# Patient Record
Sex: Female | Born: 1955
Health system: Southern US, Community
[De-identification: ages and names within clinical notes are randomized; demographics above are authoritative.]

## PROBLEM LIST (undated history)

## (undated) DIAGNOSIS — E785 Hyperlipidemia, unspecified: Secondary | ICD-10-CM

## (undated) DIAGNOSIS — F419 Anxiety disorder, unspecified: Secondary | ICD-10-CM

## (undated) DIAGNOSIS — I10 Essential (primary) hypertension: Secondary | ICD-10-CM

## (undated) DIAGNOSIS — E039 Hypothyroidism, unspecified: Secondary | ICD-10-CM

## (undated) DIAGNOSIS — N6001 Solitary cyst of right breast: Secondary | ICD-10-CM

## (undated) DIAGNOSIS — N809 Endometriosis, unspecified: Secondary | ICD-10-CM

## (undated) DIAGNOSIS — N800 Endometriosis of uterus: Secondary | ICD-10-CM

## (undated) DIAGNOSIS — N8003 Adenomyosis of the uterus: Secondary | ICD-10-CM

## (undated) DIAGNOSIS — E119 Type 2 diabetes mellitus without complications: Secondary | ICD-10-CM

## (undated) DIAGNOSIS — R638 Other symptoms and signs concerning food and fluid intake: Secondary | ICD-10-CM

## (undated) HISTORY — PX: ABDOMINAL HYSTERECTOMY: SHX81

## (undated) HISTORY — DX: Hypothyroidism, unspecified: E03.9

## (undated) HISTORY — DX: Solitary cyst of right breast: N60.01

## (undated) HISTORY — DX: Other symptoms and signs concerning food and fluid intake: R63.8

## (undated) HISTORY — DX: Adenomyosis of the uterus: N80.03

## (undated) HISTORY — DX: Essential (primary) hypertension: I10

## (undated) HISTORY — DX: Endometriosis, unspecified: N80.9

## (undated) HISTORY — DX: Endometriosis of uterus: N80.0

## (undated) HISTORY — PX: LAPAROSCOPIC SALPINGOOPHERECTOMY: SUR795

## (undated) HISTORY — DX: Hyperlipidemia, unspecified: E78.5

## (undated) HISTORY — DX: Anxiety disorder, unspecified: F41.9

---
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
## 2004-01-29 ENCOUNTER — Ambulatory Visit: Payer: Self-pay | Admitting: Obstetrics and Gynecology

## 2004-02-04 ENCOUNTER — Ambulatory Visit: Payer: Self-pay | Admitting: Obstetrics and Gynecology

## 2004-08-02 ENCOUNTER — Ambulatory Visit: Payer: Self-pay | Admitting: Obstetrics and Gynecology

## 2004-11-22 ENCOUNTER — Ambulatory Visit: Payer: Self-pay | Admitting: Obstetrics and Gynecology

## 2005-02-28 ENCOUNTER — Ambulatory Visit: Payer: Self-pay | Admitting: Obstetrics and Gynecology

## 2006-01-05 ENCOUNTER — Ambulatory Visit: Payer: Self-pay | Admitting: Obstetrics and Gynecology

## 2007-01-24 ENCOUNTER — Ambulatory Visit: Payer: Self-pay | Admitting: Obstetrics and Gynecology

## 2007-11-22 ENCOUNTER — Ambulatory Visit: Payer: Self-pay | Admitting: Obstetrics and Gynecology

## 2007-11-28 ENCOUNTER — Ambulatory Visit: Payer: Self-pay | Admitting: Obstetrics and Gynecology

## 2008-01-29 ENCOUNTER — Ambulatory Visit: Payer: Self-pay | Admitting: Obstetrics and Gynecology

## 2009-02-03 ENCOUNTER — Ambulatory Visit: Payer: Self-pay | Admitting: Obstetrics and Gynecology

## 2010-03-18 ENCOUNTER — Other Ambulatory Visit: Payer: Self-pay | Admitting: Obstetrics and Gynecology

## 2010-03-31 ENCOUNTER — Ambulatory Visit: Payer: Self-pay | Admitting: Obstetrics and Gynecology

## 2011-04-13 ENCOUNTER — Ambulatory Visit: Payer: Self-pay | Admitting: Obstetrics and Gynecology

## 2011-04-25 ENCOUNTER — Ambulatory Visit: Payer: Self-pay | Admitting: Obstetrics and Gynecology

## 2012-03-20 ENCOUNTER — Ambulatory Visit: Payer: Self-pay | Admitting: Obstetrics and Gynecology

## 2012-04-19 ENCOUNTER — Ambulatory Visit: Payer: Self-pay | Admitting: Gastroenterology

## 2012-04-19 LAB — HM COLONOSCOPY

## 2012-04-20 LAB — PATHOLOGY REPORT

## 2013-03-26 LAB — HM PAP SMEAR: HM Pap smear: NEGATIVE

## 2014-01-03 HISTORY — PX: BREAST BIOPSY: SHX20

## 2014-03-26 ENCOUNTER — Ambulatory Visit: Payer: Self-pay | Admitting: Obstetrics and Gynecology

## 2014-03-31 ENCOUNTER — Ambulatory Visit: Payer: Self-pay | Admitting: Obstetrics and Gynecology

## 2014-03-31 LAB — HM MAMMOGRAPHY

## 2014-04-02 ENCOUNTER — Encounter: Payer: Self-pay | Admitting: *Deleted

## 2014-04-28 LAB — SURGICAL PATHOLOGY

## 2014-06-12 ENCOUNTER — Other Ambulatory Visit: Payer: Self-pay

## 2014-06-12 DIAGNOSIS — F419 Anxiety disorder, unspecified: Secondary | ICD-10-CM

## 2014-06-12 DIAGNOSIS — R609 Edema, unspecified: Secondary | ICD-10-CM

## 2014-06-12 MED ORDER — TRIAMTERENE-HCTZ 37.5-25 MG PO TABS
1.0000 | ORAL_TABLET | Freq: Every day | ORAL | Status: DC
Start: 2014-06-12 — End: 2015-04-07

## 2014-06-12 MED ORDER — SERTRALINE HCL 50 MG PO TABS
75.0000 mg | ORAL_TABLET | Freq: Every day | ORAL | Status: DC
Start: 2014-06-12 — End: 2015-03-03

## 2014-06-12 NOTE — Telephone Encounter (Signed)
Pt aware meds refilled. °

## 2014-07-01 ENCOUNTER — Other Ambulatory Visit: Payer: Self-pay | Admitting: Obstetrics and Gynecology

## 2014-07-01 ENCOUNTER — Ambulatory Visit: Payer: Self-pay | Admitting: Obstetrics and Gynecology

## 2014-07-01 VITALS — BP 117/75 | HR 79 | Ht 66.0 in | Wt 189.0 lb

## 2014-07-01 LAB — POCT URINALYSIS DIPSTICK
Bilirubin, UA: NEGATIVE
Glucose, UA: NEGATIVE
Ketones, UA: NEGATIVE
LEUKOCYTES UA: NEGATIVE
Nitrite, UA: NEGATIVE
Protein, UA: NEGATIVE
Urobilinogen, UA: 0.2
pH, UA: 7.5

## 2014-07-01 NOTE — Progress Notes (Signed)
Patient ID: Rebecca Rogers, female   DOB: 08-May-1955, 59 y.o.   MRN: [id number]    Pt c/o of painful urination x 1 day. No fever or flank pain. Will send urine culture. Urogesic samples given.

## 2014-07-03 ENCOUNTER — Telehealth: Payer: Self-pay

## 2014-07-03 LAB — URINE CULTURE

## 2014-07-03 MED ORDER — NITROFURANTOIN MONOHYD MACRO 100 MG PO CAPS: 100.0000 mg | ORAL_CAPSULE | Freq: Two times a day (BID) | ORAL | Status: DC

## 2014-07-03 NOTE — Telephone Encounter (Signed)
PT AWARE. POS UTI- PER MAD MACROBID ERX.

## 2014-07-14 ENCOUNTER — Telehealth: Payer: Self-pay

## 2014-07-14 MED ORDER — CIPROFLOXACIN HCL 500 MG PO TABS: 500.0000 mg | ORAL_TABLET | Freq: Two times a day (BID) | ORAL | Status: DC

## 2014-07-14 NOTE — Telephone Encounter (Signed)
-----   Message from Brayton Mars, MD sent at 07/14/2014 10:18 AM EDT ----- Please notify - Abnormal Labs Stop Macrobid. Start Cipro 500 mg bid x 7 days.

## 2014-07-14 NOTE — Telephone Encounter (Signed)
Pt aware.

## 2014-09-26 ENCOUNTER — Other Ambulatory Visit: Payer: Self-pay | Admitting: Obstetrics and Gynecology

## 2014-09-26 ENCOUNTER — Other Ambulatory Visit: Payer: Self-pay | Admitting: Family Medicine

## 2014-09-26 DIAGNOSIS — R928 Other abnormal and inconclusive findings on diagnostic imaging of breast: Secondary | ICD-10-CM

## 2014-10-07 ENCOUNTER — Ambulatory Visit
Admission: RE | Admit: 2014-10-07 | Discharge: 2014-10-07 | Disposition: A | Discharge status: Home / Self Care | Payer: 59 | Source: Ambulatory Visit | Attending: Obstetrics and Gynecology | Admitting: Obstetrics and Gynecology

## 2014-10-07 DIAGNOSIS — R928 Other abnormal and inconclusive findings on diagnostic imaging of breast: Secondary | ICD-10-CM | POA: Insufficient documentation

## 2015-02-12 DIAGNOSIS — L821 Other seborrheic keratosis: Secondary | ICD-10-CM | POA: Diagnosis not present

## 2015-02-23 DIAGNOSIS — H5213 Myopia, bilateral: Secondary | ICD-10-CM | POA: Diagnosis not present

## 2015-03-03 ENCOUNTER — Other Ambulatory Visit: Payer: Self-pay

## 2015-03-03 DIAGNOSIS — F419 Anxiety disorder, unspecified: Secondary | ICD-10-CM

## 2015-03-03 MED ORDER — SERTRALINE HCL 50 MG PO TABS: 75.0000 mg | ORAL_TABLET | Freq: Every day | ORAL | Status: DC

## 2015-04-07 ENCOUNTER — Encounter: Payer: Self-pay | Admitting: Obstetrics and Gynecology

## 2015-04-07 ENCOUNTER — Ambulatory Visit (INDEPENDENT_AMBULATORY_CARE_PROVIDER_SITE_OTHER): Payer: 59 | Admitting: Obstetrics and Gynecology

## 2015-04-07 VITALS — BP 122/72 | HR 98 | Ht 66.0 in | Wt 190.5 lb

## 2015-04-07 DIAGNOSIS — Z90710 Acquired absence of both cervix and uterus: Secondary | ICD-10-CM

## 2015-04-07 DIAGNOSIS — Z01419 Encounter for gynecological examination (general) (routine) without abnormal findings: Secondary | ICD-10-CM | POA: Diagnosis not present

## 2015-04-07 DIAGNOSIS — Z8041 Family history of malignant neoplasm of ovary: Secondary | ICD-10-CM | POA: Diagnosis not present

## 2015-04-07 DIAGNOSIS — Z90722 Acquired absence of ovaries, bilateral: Secondary | ICD-10-CM | POA: Diagnosis not present

## 2015-04-07 DIAGNOSIS — F419 Anxiety disorder, unspecified: Secondary | ICD-10-CM | POA: Diagnosis not present

## 2015-04-07 DIAGNOSIS — Z1211 Encounter for screening for malignant neoplasm of colon: Secondary | ICD-10-CM

## 2015-04-07 DIAGNOSIS — N6310 Unspecified lump in the right breast, unspecified quadrant: Secondary | ICD-10-CM

## 2015-04-07 DIAGNOSIS — N63 Unspecified lump in breast: Secondary | ICD-10-CM | POA: Diagnosis not present

## 2015-04-07 DIAGNOSIS — R638 Other symptoms and signs concerning food and fluid intake: Secondary | ICD-10-CM | POA: Diagnosis not present

## 2015-04-07 DIAGNOSIS — I10 Essential (primary) hypertension: Secondary | ICD-10-CM | POA: Diagnosis not present

## 2015-04-07 DIAGNOSIS — N8003 Adenomyosis of the uterus: Secondary | ICD-10-CM | POA: Insufficient documentation

## 2015-04-07 DIAGNOSIS — N8000 Endometriosis of the uterus, unspecified: Secondary | ICD-10-CM | POA: Insufficient documentation

## 2015-04-07 DIAGNOSIS — Z90711 Acquired absence of uterus with remaining cervical stump: Secondary | ICD-10-CM | POA: Insufficient documentation

## 2015-04-07 MED ORDER — SERTRALINE HCL 50 MG PO TABS: 75.0000 mg | ORAL_TABLET | Freq: Every day | ORAL | Status: DC

## 2015-04-07 MED ORDER — TRIAMTERENE-HCTZ 37.5-25 MG PO TABS: 1.0000 | ORAL_TABLET | Freq: Every day | ORAL | Status: DC

## 2015-04-07 NOTE — Addendum Note (Signed)
Addended by: Elouise Munroe on: 04/07/2015 09:27 AM   Modules accepted: Orders

## 2015-04-07 NOTE — Patient Instructions (Signed)
1. No Pap needed 2. Mammogram ordered 3. Colon cancer screening with colonoscopy this year to be scheduled 4. Continue with healthy eating and exercise and weight loss 5. Refill Zoloft 6. Refill triamterene hydrochlorothiazide 7. Return in 1 year

## 2015-04-07 NOTE — Progress Notes (Signed)
Patient ID: Rebecca Rogers, female   DOB: May 13, 1955, 60 y.o.   MRN: [id number] ANNUAL PREVENTATIVE CARE GYN  ENCOUNTER NOTE  Subjective:       Rebecca Rogers is a 60 y.o. G0P0000 female here for a routine annual gynecologic exam.  Current complaints: 1.  none   60 year old white female para 0, menopausal, on no hormone replacement therapy, asymptomatic, with history of adenomyosis, status post [place], with family history of ovarian cancer in mom, status post prophylactic BSO 8 years ago, presents for routine physical. Interval history: Status post core needle biopsies of right breast abnormality found on screening mammogram mammogram 2016, benign; status post interval mammogram October 2016-BI-RADS 3 History of increased BMI; weight stable History of chronic peripheral edema, controlled with triamterene hydrochlorothiazide History of anxiety/depression, controlled on Zoloft 75 mg a day History of colon polyps; screening colonoscopy due this year     Gynecologic History No LMP recorded. Patient has had a hysterectomy. Contraception: status post hysterectomy; [place]; pathology-adenomyosis Family history ovarian cancer; status post laparoscopic BSO Last Pap: 03/2013 neg/neg.  Last mammogram: 10/2014 birad 3 need bilateral dx and rt u/s. Results were: birad 3  Obstetric History OB History  Gravida Para Term Preterm AB SAB TAB Ectopic Multiple Living  0 0 0 0 0 0 0 0 0 0         Past Medical History  Diagnosis Date  . Adenomyosis   . Hypertension     bordeline  . Anxiety   . Increased BMI   . Cyst of right breast     4-5 oclock 1/2 cm    Past Surgical History  Procedure Laterality Date  . Tonsillectomy  1965  . Laparoscopic salpingoopherectomy      prophylactic  . Abdominal hysterectomy      lsh- adenomysis and fibroids    Current Outpatient Prescriptions on File Prior to Visit  Medication Sig Dispense Refill  . sertraline (ZOLOFT) 50 MG tablet Take 1.5 tablets (75 mg  total) by mouth daily. 1 & 1/2 tab daily 135 tablet 4  . triamterene-hydrochlorothiazide (MAXZIDE-25) 37.5-25 MG per tablet Take 1 tablet by mouth daily. 30 tablet 9   No current facility-administered medications on file prior to visit.    No Known Allergies  Social History   Social History  . Marital Status: Married    Spouse Name: N/A  . Number of Children: N/A  . Years of Education: N/A   Occupational History  . Not on file.   Social History Main Topics  . Smoking status: Never Smoker   . Smokeless tobacco: Not on file  . Alcohol Use: No  . Drug Use: No  . Sexual Activity: Not Currently   Other Topics Concern  . Not on file   Social History Narrative    Family History  Problem Relation Age of Onset  . Ovarian cancer Mother   . Breast cancer Neg Hx   . Colon cancer Neg Hx   . Diabetes Neg Hx   . Heart disease Neg Hx     The following portions of the patient's history were reviewed and updated as appropriate: allergies, current medications, past family history, past medical history, past social history, past surgical history and problem list.  Review of Systems ROS Review of Systems - General ROS: negative for - chills, fatigue, fever, hot flashes, night sweats, weight gain or weight loss Psychological ROS: negative for - anxiety, decreased libido, depression, mood swings, physical abuse or sexual abuse Ophthalmic ROS:  negative for - blurry vision, eye pain or loss of vision ENT ROS: negative for - headaches, hearing change, visual changes or vocal changes Allergy and Immunology ROS: negative for - hives, itchy/watery eyes or seasonal allergies Hematological and Lymphatic ROS: negative for - bleeding problems, bruising, swollen lymph nodes or weight loss Endocrine ROS: negative for - galactorrhea, hair pattern changes, hot flashes, malaise/lethargy, mood swings, palpitations, polydipsia/polyuria, skin changes, temperature intolerance or unexpected weight  changes Breast ROS: negative for - new or changing breast lumps or nipple discharge Respiratory ROS: negative for - cough or shortness of breath Cardiovascular ROS: negative for - chest pain, irregular heartbeat, palpitations or shortness of breath Gastrointestinal ROS: no abdominal pain, change in bowel habits, or black or bloody stools Genito-Urinary ROS: no dysuria, trouble voiding, or hematuria Musculoskeletal ROS: negative for - joint pain or joint stiffness Neurological ROS: negative for - bowel and bladder control changes Dermatological ROS: negative for rash and skin lesion changes   Objective:   BP 122/72 mmHg  Pulse 98  Ht 5\' 6"  (1.676 m)  Wt 190 lb 8 oz (86.41 kg)  BMI 30.76 kg/m2 CONSTITUTIONAL: Well-developed, well-nourished female in no acute distress.  PSYCHIATRIC: Normal mood and affect. Normal behavior. Normal judgment and thought content. [place]: Alert and oriented to person, place, and time. Normal muscle tone coordination. No cranial nerve deficit noted. HENT:  Normocephalic, atraumatic, External right and left ear normal. Oropharynx is clear and moist EYES: Conjunctivae and EOM are normal.  No scleral icterus.  NECK: Normal range of motion, supple, no masses.  Normal thyroid.  SKIN: Skin is warm and dry. No rash noted. Not diaphoretic. No erythema. No pallor. CARDIOVASCULAR: Normal heart rate noted, regular rhythm, no murmur. RESPIRATORY: Clear to auscultation bilaterally. Effort and breath sounds normal, no problems with respiration noted. BREASTS: Symmetric in size. No masses, skin changes, nipple drainage, or lymphadenopathy. ABDOMEN: Soft, normal bowel sounds, no distention noted.  No tenderness, rebound or guarding.  BLADDER: Normal PELVIC:  External Genitalia: Normal  BUS: Normal  Vagina: Normal; thin white secretions  Cervix: Normal; no cervical motion tenderness  Uterus: Surgically absent  Adnexa: Surgically absent; no palpable masses  RV: External  Exam NormaI, No Rectal Masses and Normal Sphincter tone  MUSCULOSKELETAL: Normal range of motion. No tenderness.  No cyanosis, clubbing, or edema.  2+ distal pulses. LYMPHATIC: No Axillary, Supraclavicular, or Inguinal Adenopathy.    Assessment:   Annual gynecologic examination 60 y.o. Contraception: status post hysterectomy LSH; status post laparoscopic BSO bmi-30, stable Surgical menopause, asymptomatic Peripheral edema, controlled with diuretic History of colon polyps, in need of colonoscopy  Plan:  Pap: Not needed Mammogram: Ordered Stool Guaiac Testing:  colonoscopy due Labs: renal panel, vit d, a1c,lipid, tsh Routine preventative health maintenance measures emphasized: Exercise/Diet/Weight control, Tobacco Warnings, Alcohol/Substance use risks and Stress Management Refill Zoloft Refill triamterene hydrochlorothiazide Return to [place], CMA  Brayton Mars, MD  Note: This dictation was prepared with Dragon dictation along with smaller phrase technology. Any transcriptional errors that result from this process are unintentional.

## 2015-04-07 NOTE — Addendum Note (Signed)
Addended by: Elouise Munroe on: 04/07/2015 09:18 AM   Modules accepted: Orders

## 2015-04-14 NOTE — Addendum Note (Signed)
Addended by: Elouise Munroe on: 04/14/2015 08:52 AM   Modules accepted: Orders

## 2015-04-21 DIAGNOSIS — Z01419 Encounter for gynecological examination (general) (routine) without abnormal findings: Secondary | ICD-10-CM | POA: Diagnosis not present

## 2015-04-21 DIAGNOSIS — I10 Essential (primary) hypertension: Secondary | ICD-10-CM | POA: Diagnosis not present

## 2015-04-22 ENCOUNTER — Telehealth: Payer: Self-pay

## 2015-04-22 ENCOUNTER — Other Ambulatory Visit: Payer: Self-pay | Admitting: Obstetrics and Gynecology

## 2015-04-22 DIAGNOSIS — R7989 Other specified abnormal findings of blood chemistry: Secondary | ICD-10-CM

## 2015-04-22 DIAGNOSIS — E039 Hypothyroidism, unspecified: Secondary | ICD-10-CM

## 2015-04-22 DIAGNOSIS — R946 Abnormal results of thyroid function studies: Secondary | ICD-10-CM | POA: Diagnosis not present

## 2015-04-22 LAB — LIPID PANEL
CHOL/HDL RATIO: 7.3 ratio — AB (ref 0.0–4.4)
Cholesterol, Total: 211 mg/dL — ABNORMAL HIGH (ref 100–199)
HDL: 29 mg/dL — AB (ref >39)
LDL Calculated: 125 mg/dL — ABNORMAL HIGH (ref 0–99)
TRIGLYCERIDES: 284 mg/dL — AB (ref 0–149)
VLDL CHOLESTEROL CAL: 57 mg/dL — AB (ref 5–40)

## 2015-04-22 LAB — RENAL FUNCTION PANEL
ALBUMIN: 4 g/dL (ref 3.5–5.5)
BUN/Creatinine Ratio: 14 (ref 9–23)
BUN: 13 mg/dL (ref 6–24)
CHLORIDE: 98 mmol/L (ref 96–106)
CO2: 25 mmol/L (ref 18–29)
Calcium: 9.6 mg/dL (ref 8.7–10.2)
Creatinine, Ser: 0.93 mg/dL (ref 0.57–1.00)
GFR calc Af Amer: 79 mL/min/{1.73_m2} (ref >59)
GFR calc non Af Amer: 68 mL/min/{1.73_m2} (ref >59)
GLUCOSE: 109 mg/dL — AB (ref 65–99)
PHOSPHORUS: 3.3 mg/dL (ref 2.5–4.5)
POTASSIUM: 4.5 mmol/L (ref 3.5–5.2)
Sodium: 140 mmol/L (ref 134–144)

## 2015-04-22 LAB — TSH: TSH: 5.26 u[IU]/mL — ABNORMAL HIGH (ref 0.450–4.500)

## 2015-04-22 LAB — HEMOGLOBIN A1C
Est. average glucose Bld gHb Est-mCnc: 126 mg/dL
HEMOGLOBIN A1C: 6 % — AB (ref 4.8–5.6)

## 2015-04-22 LAB — VITAMIN D 25 HYDROXY (VIT D DEFICIENCY, FRACTURES): VIT D 25 HYDROXY: 31.7 ng/mL (ref 30.0–100.0)

## 2015-04-22 MED ORDER — LEVOTHYROXINE SODIUM 50 MCG PO TABS: 50.0000 ug | ORAL_TABLET | Freq: Every day | ORAL | Status: DC

## 2015-04-22 NOTE — Telephone Encounter (Signed)
Mad spoke with [name]

## 2015-04-22 NOTE — Telephone Encounter (Signed)
-----   Message from Brayton Mars, MD sent at 04/22/2015  8:53 AM EDT ----- Please notify - Abnormal Labs Repeat in 6 months Synthroid 50 g a day- Draw TSH and free T4 this morning and repeat in 3 months    I did talk with [name]

## 2015-04-23 LAB — TSH: TSH: 4.35 u[IU]/mL (ref 0.450–4.500)

## 2015-04-23 LAB — T4, FREE: FREE T4: 1.01 ng/dL (ref 0.82–1.77)

## 2015-04-24 ENCOUNTER — Ambulatory Visit
Admission: RE | Admit: 2015-04-24 | Discharge: 2015-04-24 | Disposition: A | Discharge status: Home / Self Care | Payer: 59 | Source: Ambulatory Visit | Attending: Obstetrics and Gynecology | Admitting: Obstetrics and Gynecology

## 2015-04-24 ENCOUNTER — Other Ambulatory Visit: Payer: Self-pay | Admitting: Obstetrics and Gynecology

## 2015-04-24 DIAGNOSIS — N6310 Unspecified lump in the right breast, unspecified quadrant: Secondary | ICD-10-CM

## 2015-04-24 DIAGNOSIS — N63 Unspecified lump in breast: Secondary | ICD-10-CM | POA: Diagnosis not present

## 2015-04-24 DIAGNOSIS — R928 Other abnormal and inconclusive findings on diagnostic imaging of breast: Secondary | ICD-10-CM | POA: Diagnosis not present

## 2015-04-24 NOTE — Telephone Encounter (Signed)
error 

## 2015-04-27 ENCOUNTER — Telehealth: Payer: Self-pay

## 2015-04-27 ENCOUNTER — Other Ambulatory Visit: Payer: Self-pay

## 2015-04-27 DIAGNOSIS — Z86010 Personal history of colonic polyps: Secondary | ICD-10-CM

## 2015-04-27 NOTE — Telephone Encounter (Signed)
Gastroenterology Pre-Procedure Review  Request Date: 06/05/15 Requesting Physician: Dr. Allen Norris  PATIENT REVIEW QUESTIONS: The patient responded to the following health history questions as indicated:    1. Are you having any GI issues? no 2. Do you have a personal history of Polyps? yes (3 Years ago- Dr. Allen Norris removed) 3. Do you have a family history of Colon Cancer or Polyps? no 4. Diabetes Mellitus? no 5. Joint replacements in the past 12 months?no 6. Major health problems in the past 3 months?no 7. Any artificial heart valves, MVP, or defibrillator?no    MEDICATIONS & ALLERGIES:    Patient reports the following regarding taking any anticoagulation/antiplatelet therapy:   Plavix, Coumadin, Eliquis, Xarelto, Lovenox, Pradaxa, Brilinta, or Effient? no Aspirin? no  Patient confirms/reports the following medications:  Current Outpatient Prescriptions  Medication Sig Dispense Refill  . levothyroxine (SYNTHROID) 50 MCG tablet Take 1 tablet (50 mcg total) by mouth daily before breakfast. 90 tablet 1  . sertraline (ZOLOFT) 50 MG tablet Take 1.5 tablets (75 mg total) by mouth daily. 1 & 1/2 tab daily 135 tablet 4  . triamterene-hydrochlorothiazide (MAXZIDE-25) 37.5-25 MG tablet Take 1 tablet by mouth daily. 90 tablet 4   No current facility-administered medications for this visit.    Patient confirms/reports the following allergies:  No Known Allergies  No orders of the defined types were placed in this encounter.    AUTHORIZATION INFORMATION Primary Insurance: 1D#: Group #:  Secondary Insurance: 1D#: Group #:  SCHEDULE INFORMATION: Date: 06/05/15 Time: Location: [place]

## 2015-05-01 NOTE — Progress Notes (Signed)
Pt will be made aware thru my chart message. Order placed. Will put on lab schedule.

## 2015-05-01 NOTE — Addendum Note (Signed)
Addended by: Elouise Munroe on: 05/01/2015 11:57 AM   Modules accepted: Orders

## 2015-05-26 ENCOUNTER — Encounter: Payer: Self-pay | Admitting: *Deleted

## 2015-06-03 ENCOUNTER — Other Ambulatory Visit: Payer: Self-pay | Admitting: Obstetrics and Gynecology

## 2015-06-03 DIAGNOSIS — R599 Enlarged lymph nodes, unspecified: Secondary | ICD-10-CM | POA: Diagnosis not present

## 2015-06-03 MED ORDER — ACYCLOVIR 400 MG PO TABS: 400.0000 mg | ORAL_TABLET | Freq: Three times a day (TID) | ORAL | Status: DC

## 2015-06-03 NOTE — Discharge Instructions (Signed)

## 2015-06-04 DIAGNOSIS — L03211 Cellulitis of face: Secondary | ICD-10-CM | POA: Diagnosis not present

## 2015-06-04 DIAGNOSIS — K120 Recurrent oral aphthae: Secondary | ICD-10-CM | POA: Diagnosis not present

## 2015-06-04 LAB — CBC WITH DIFFERENTIAL/PLATELET
Basophils Absolute: 0 10*3/uL (ref 0.0–0.2)
Basos: 0 %
EOS (ABSOLUTE): 0.3 10*3/uL (ref 0.0–0.4)
EOS: 4 %
HEMATOCRIT: 42.5 % (ref 34.0–46.6)
HEMOGLOBIN: 14.8 g/dL (ref 11.1–15.9)
Immature Grans (Abs): 0 10*3/uL (ref 0.0–0.1)
Immature Granulocytes: 0 %
LYMPHS ABS: 1.5 10*3/uL (ref 0.7–3.1)
Lymphs: 21 %
MCH: 30.1 pg (ref 26.6–33.0)
MCHC: 34.8 g/dL (ref 31.5–35.7)
MCV: 86 fL (ref 79–97)
MONOCYTES: 10 %
MONOS ABS: 0.8 10*3/uL (ref 0.1–0.9)
NEUTROS ABS: 4.6 10*3/uL (ref 1.4–7.0)
Neutrophils: 65 %
Platelets: 365 10*3/uL (ref 150–379)
RBC: 4.92 x10E6/uL (ref 3.77–5.28)
RDW: 14.3 % (ref 12.3–15.4)
WBC: 7.2 10*3/uL (ref 3.4–10.8)

## 2015-06-04 LAB — HSV(HERPES SIMPLEX VRS) I + II AB-IGG
HSV 1 Glycoprotein G Ab, IgG: <0.91 index (ref 0.00–0.90)
HSV 2 Glycoprotein G Ab, IgG: <0.91 index (ref 0.00–0.90)

## 2015-06-04 LAB — HSV 1 AND 2 IGM ABS, INDIRECT
HSV 1 IgM: <1:10 {titer}
HSV 2 IgM: <1:10 {titer}

## 2015-06-04 LAB — IGG: IGG (IMMUNOGLOBIN G), SERUM: 1385 mg/dL (ref 700–1600)

## 2015-06-04 LAB — SEDIMENTATION RATE: SED RATE: 40 mm/h (ref 0–40)

## 2015-06-04 LAB — IGM: IGM (IMMUNOGLOBULIN M), SRM: 87 mg/dL (ref 26–217)

## 2015-06-05 ENCOUNTER — Ambulatory Visit: Payer: 59 | Admitting: Anesthesiology

## 2015-06-05 ENCOUNTER — Encounter
Admission: RE | Disposition: A | Discharge status: Home / Self Care | Payer: Self-pay | Source: Ambulatory Visit | Attending: Gastroenterology

## 2015-06-05 ENCOUNTER — Encounter: Payer: Self-pay | Admitting: *Deleted

## 2015-06-05 ENCOUNTER — Ambulatory Visit
Admission: RE | Admit: 2015-06-05 | Discharge: 2015-06-05 | Disposition: A | Discharge status: Home / Self Care | Payer: 59 | Source: Ambulatory Visit | Attending: Gastroenterology | Admitting: Gastroenterology

## 2015-06-05 DIAGNOSIS — Z90710 Acquired absence of both cervix and uterus: Secondary | ICD-10-CM | POA: Insufficient documentation

## 2015-06-05 DIAGNOSIS — E039 Hypothyroidism, unspecified: Secondary | ICD-10-CM | POA: Diagnosis not present

## 2015-06-05 DIAGNOSIS — Z9889 Other specified postprocedural states: Secondary | ICD-10-CM | POA: Diagnosis not present

## 2015-06-05 DIAGNOSIS — I10 Essential (primary) hypertension: Secondary | ICD-10-CM | POA: Insufficient documentation

## 2015-06-05 DIAGNOSIS — D125 Benign neoplasm of sigmoid colon: Secondary | ICD-10-CM | POA: Diagnosis not present

## 2015-06-05 DIAGNOSIS — Z8042 Family history of malignant neoplasm of prostate: Secondary | ICD-10-CM | POA: Diagnosis not present

## 2015-06-05 DIAGNOSIS — F419 Anxiety disorder, unspecified: Secondary | ICD-10-CM | POA: Diagnosis not present

## 2015-06-05 DIAGNOSIS — D120 Benign neoplasm of cecum: Secondary | ICD-10-CM | POA: Diagnosis not present

## 2015-06-05 DIAGNOSIS — Z79899 Other long term (current) drug therapy: Secondary | ICD-10-CM | POA: Diagnosis not present

## 2015-06-05 DIAGNOSIS — D122 Benign neoplasm of ascending colon: Secondary | ICD-10-CM | POA: Insufficient documentation

## 2015-06-05 DIAGNOSIS — Z1211 Encounter for screening for malignant neoplasm of colon: Secondary | ICD-10-CM | POA: Diagnosis not present

## 2015-06-05 DIAGNOSIS — K635 Polyp of colon: Secondary | ICD-10-CM | POA: Insufficient documentation

## 2015-06-05 DIAGNOSIS — K641 Second degree hemorrhoids: Secondary | ICD-10-CM | POA: Insufficient documentation

## 2015-06-05 DIAGNOSIS — Z860100 Personal history of colon polyps, unspecified: Secondary | ICD-10-CM | POA: Insufficient documentation

## 2015-06-05 DIAGNOSIS — Z8041 Family history of malignant neoplasm of ovary: Secondary | ICD-10-CM | POA: Insufficient documentation

## 2015-06-05 HISTORY — PX: COLONOSCOPY WITH PROPOFOL: SHX5780

## 2015-06-05 HISTORY — PX: POLYPECTOMY: SHX5525

## 2015-06-05 SURGERY — COLONOSCOPY WITH PROPOFOL
Anesthesia: Monitor Anesthesia Care | Wound class: Contaminated

## 2015-06-05 MED ORDER — PROPOFOL 10 MG/ML IV BOLUS
INTRAVENOUS | Status: DC | PRN
  Administered 2015-06-05 (×6): 20 mg via INTRAVENOUS
  Administered 2015-06-05: 50 mg via INTRAVENOUS
  Administered 2015-06-05: 100 mg via INTRAVENOUS
  Administered 2015-06-05: 20 mg via INTRAVENOUS
  Administered 2015-06-05: 50 mg via INTRAVENOUS
  Administered 2015-06-05: 10 mg via INTRAVENOUS

## 2015-06-05 MED ORDER — LIDOCAINE HCL (CARDIAC) 20 MG/ML IV SOLN
INTRAVENOUS | Status: DC | PRN
  Administered 2015-06-05: 40 mg via INTRAVENOUS

## 2015-06-05 MED ORDER — STERILE WATER FOR IRRIGATION IR SOLN
Status: DC | PRN
  Administered 2015-06-05: 08:00:00

## 2015-06-05 MED ORDER — LACTATED RINGERS IV SOLN
INTRAVENOUS | Status: DC
  Administered 2015-06-05 (×2): via INTRAVENOUS

## 2015-06-05 SURGICAL SUPPLY — 23 items
CANISTER SUCT 1200ML W/VALVE (MISCELLANEOUS) ×3 IMPLANT
CLIP HMST 235XBRD CATH ROT (MISCELLANEOUS) IMPLANT
CLIP RESOLUTION 360 11X235 (MISCELLANEOUS)
FCP ESCP3.2XJMB 240X2.8X (MISCELLANEOUS)
FORCEPS BIOP RAD 4 LRG CAP 4 (CUTTING FORCEPS) IMPLANT
FORCEPS BIOP RJ4 240 W/NDL (MISCELLANEOUS)
FORCEPS ESCP3.2XJMB 240X2.8X (MISCELLANEOUS) IMPLANT
GOWN CVR UNV OPN BCK APRN NK (MISCELLANEOUS) ×4 IMPLANT
GOWN ISOL THUMB LOOP REG UNIV (MISCELLANEOUS) ×2
INJECTOR VARIJECT VIN23 (MISCELLANEOUS) IMPLANT
KIT DEFENDO VALVE AND CONN (KITS) IMPLANT
KIT ENDO PROCEDURE OLY (KITS) ×3 IMPLANT
MARKER SPOT ENDO TATTOO 5ML (MISCELLANEOUS) IMPLANT
PAD GROUND ADULT SPLIT (MISCELLANEOUS) IMPLANT
PROBE APC STR FIRE (PROBE) IMPLANT
SNARE SHORT THROW 13M SML OVAL (MISCELLANEOUS) ×3 IMPLANT
SNARE SHORT THROW 30M LRG OVAL (MISCELLANEOUS) IMPLANT
SNARE SNG USE RND 15MM (INSTRUMENTS) IMPLANT
SNARE SPIRAL (MISCELLANEOUS) ×3 IMPLANT
SPOT EX ENDOSCOPIC TATTOO (MISCELLANEOUS)
TRAP ETRAP POLY (MISCELLANEOUS) ×3 IMPLANT
VARIJECT INJECTOR VIN23 (MISCELLANEOUS)
WATER STERILE IRR 250ML POUR (IV SOLUTION) ×3 IMPLANT

## 2015-06-05 NOTE — H&P (Signed)
  [facility]  [address], [place], [place] [address] Phone: [phone] Fax : [phone]  Primary Care Physician:  No primary care provider on file. Primary Gastroenterologist:  Dr. Allen Norris  Pre-Procedure History & Physical: HPI:  Rebecca Rogers is a 60 y.o. female is here for an colonoscopy.   Past Medical History  Diagnosis Date  . Adenomyosis   . Hypertension     [place]  . Anxiety   . Increased BMI   . Cyst of right breast     4-5 oclock 1/2 cm    Past Surgical History  Procedure Laterality Date  . Tonsillectomy  1965  . Laparoscopic salpingoopherectomy      prophylactic  . Abdominal hysterectomy      lsh- adenomysis and fibroids    Prior to Admission medications   Medication Sig Start Date End Date Taking? Authorizing Provider  acetaminophen (TYLENOL) 325 MG tablet Take 650 mg by mouth every 6 (six) hours as needed.   Yes Historical Provider, MD  acyclovir (ZOVIRAX) 400 MG tablet Take 1 tablet (400 mg total) by mouth 3 (three) times daily. For 10 days 06/03/15  Yes Brayton Mars, MD  levothyroxine (SYNTHROID) 50 MCG tablet Take 1 tablet (50 mcg total) by mouth daily before breakfast. 04/22/15  Yes Alanda Slim Defrancesco, MD  sertraline (ZOLOFT) 50 MG tablet Take 1.5 tablets (75 mg total) by mouth daily. 1 & 1/2 tab daily 04/07/15  Yes Alanda Slim Defrancesco, MD  triamterene-hydrochlorothiazide (MAXZIDE-25) 37.5-25 MG tablet Take 1 tablet by mouth daily. 04/07/15  Yes Brayton Mars, MD    Allergies as of 04/27/2015  . (No Known Allergies)    Family History  Problem Relation Age of Onset  . Ovarian cancer Mother   . Breast cancer Neg Hx   . Colon cancer Neg Hx   . Diabetes Neg Hx   . Heart disease Neg Hx   . Prostate cancer Father     Social History   Social History  . Marital Status: Married    Spouse Name: N/A  . Number of Children: N/A  . Years of Education: N/A   Occupational History  . Not on file.   Social  History Main Topics  . Smoking status: Never Smoker   . Smokeless tobacco: Never Used  . Alcohol Use: No  . Drug Use: No  . Sexual Activity: Not Currently   Other Topics Concern  . Not on file   Social History Narrative    Review of Systems: See HPI, otherwise negative ROS  Physical Exam: BP 148/72 mmHg  Pulse 74  Temp(Src) 99.1 F (37.3 C)  Resp 16  Ht 5\' 6"  (1.676 m)  Wt 185 lb (83.915 kg)  BMI 29.87 kg/m2  SpO2 97% General:   Alert,  pleasant and cooperative in NAD Head:  Normocephalic and atraumatic. Neck:  Supple; no masses or thyromegaly. Lungs:  Clear throughout to auscultation.    Heart:  Regular rate and rhythm. Abdomen:  Soft, nontender and nondistended. Normal bowel sounds, without guarding, and without rebound.   Neurologic:  Alert and  oriented x4;  grossly normal neurologically.  Impression/Plan: [name] is here for an colonoscopy to be performed for history of colon poylps  Risks, benefits, limitations, and alternatives regarding  endoscopy have been reviewed with the patient.  Questions have been answered.  All parties agreeable.   Lucilla Lame, MD  06/05/2015, 7:53 AM

## 2015-06-05 NOTE — Anesthesia Procedure Notes (Signed)
Procedure Name: MAC Performed by: Azaryah Heathcock Pre-anesthesia Checklist: Patient identified, Emergency Drugs available, Suction available, Patient being monitored and Timeout performed Patient Re-evaluated:Patient Re-evaluated prior to inductionOxygen Delivery Method: Nasal cannula       

## 2015-06-05 NOTE — Op Note (Signed)
[facility] Gastroenterology Patient Name: Rebecca Rogers Procedure Date: 06/05/2015 8:12 AM MRN: [id number] Account #: [id number] Date of Birth: 1955/10/29 Admit Type: Outpatient Age: 60 Room: [facility] OR ROOM 01 Gender: Female Note Status: Finalized Procedure:            Colonoscopy Indications:          High risk colon cancer surveillance: Personal history                        of colonic polyps Providers:            Lucilla Lame, MD Referring MD:         Alanda Slim. Defrancesco, MD (Referring MD) Medicines:            Propofol per Anesthesia Complications:        No immediate complications. Procedure:            Pre-Anesthesia Assessment:                       - Prior to the procedure, a History and Physical was                        performed, and patient medications and allergies were                        reviewed. The patient's tolerance of previous                        anesthesia was also reviewed. The risks and benefits of                        the procedure and the sedation options and risks were                        discussed with the patient. All questions were                        answered, and informed consent was obtained. Prior                        Anticoagulants: The patient has taken no previous                        anticoagulant or antiplatelet agents. ASA Grade                        Assessment: II - A patient with mild systemic disease.                        After reviewing the risks and benefits, the patient was                        deemed in satisfactory condition to undergo the                        procedure.                       After obtaining informed consent, the colonoscope was  passed under direct vision. Throughout the procedure,                        the patient's blood pressure, pulse, and oxygen                        saturations were monitored continuously. The Olympus CF    H180AL colonoscope (S#: [id number]) was introduced through                        the anus and advanced to the the cecum, identified by                        appendiceal orifice and ileocecal valve. The                        colonoscopy was performed without difficulty. The                        patient tolerated the procedure well. The quality of                        the bowel preparation was good. Findings:      The perianal and digital rectal examinations were normal.      A 10 mm polyp was found in the ascending colon. The polyp was sessile.       The polyp was removed with a cold snare. Resection and retrieval were       complete.      A 4 mm polyp was found in the cecum. The polyp was sessile. The polyp       was removed with a cold snare. Resection and retrieval were complete.      Two sessile polyps were found in the sigmoid colon. The polyps were 4 to       7 mm in size. These polyps were removed with a cold snare. Resection and       retrieval were complete.      Non-bleeding internal hemorrhoids were found during retroflexion. The       hemorrhoids were Grade II (internal hemorrhoids that prolapse but reduce       spontaneously).      A 3 mm polyp was found in the transverse colon. The polyp was sessile.       The polyp was removed with a cold snare. Resection and retrieval were       complete. Impression:           - One 10 mm polyp in the ascending colon, removed with                        a cold snare. Resected and retrieved.                       - One 4 mm polyp in the cecum, removed with a cold                        snare. Resected and retrieved.                       - Two 4 to 7 mm polyps in the sigmoid colon, removed  with a cold snare. Resected and retrieved.                       - Non-bleeding internal hemorrhoids. Recommendation:       - Repeat colonoscopy in 3 years for surveillance. Procedure Code(s):    --- Professional ---                        [phone], Colonoscopy, flexible; with removal of tumor(s),                        polyp(s), or other lesion(s) by snare technique Diagnosis Code(s):    --- Professional ---                       Z86.010, Personal history of colonic polyps                       D12.2, Benign neoplasm of ascending colon                       D12.0, Benign neoplasm of cecum                       D12.5, Benign neoplasm of sigmoid colon CPT copyright 2016 American Medical Association. All rights reserved. The codes documented in this report are preliminary and upon coder review may  be revised to meet current compliance requirements. Lucilla Lame, MD 06/05/2015 8:45:06 AM This report has been signed electronically. Number of Addenda: 0 Note Initiated On: 06/05/2015 8:12 AM Scope Withdrawal Time: 0 hours 11 minutes 24 seconds  Total Procedure Duration: 0 hours 20 minutes 35 seconds       [facility]

## 2015-06-05 NOTE — Anesthesia Postprocedure Evaluation (Signed)
Anesthesia Post Note  Patient: Rebecca Rogers  Procedure(s) Performed: Procedure(s) (LRB): COLONOSCOPY WITH PROPOFOL (N/A) POLYPECTOMY  Patient location during evaluation: PACU Anesthesia Type: General Level of consciousness: awake and alert Pain management: pain level controlled Vital Signs Assessment: post-procedure vital signs reviewed and stable Respiratory status: spontaneous breathing, nonlabored ventilation, respiratory function stable and patient connected to nasal cannula oxygen Cardiovascular status: blood pressure returned to baseline and stable Postop Assessment: no signs of nausea or vomiting Anesthetic complications: no    Marshell Levan

## 2015-06-05 NOTE — Anesthesia Preprocedure Evaluation (Signed)
Anesthesia Evaluation  Patient identified by MRN, date of birth, ID band Patient awake    Airway Mallampati: II  TM Distance: >3 FB Neck ROM: Full    Dental   Pulmonary    Pulmonary exam normal        Cardiovascular hypertension, Normal cardiovascular exam     Neuro/Psych    GI/Hepatic   Endo/Other  Hypothyroidism   Renal/GU      Musculoskeletal   Abdominal   Peds  Hematology   Anesthesia Other Findings   Reproductive/Obstetrics                             Anesthesia Physical Anesthesia Plan  ASA: II  Anesthesia Plan: MAC   Post-op Pain Management:    Induction: Intravenous  Airway Management Planned:   Additional Equipment:   Intra-op Plan:   Post-operative Plan:   Informed Consent: I have reviewed the patients History and Physical, chart, labs and discussed the procedure including the risks, benefits and alternatives for the proposed anesthesia with the patient or authorized representative who has indicated his/her understanding and acceptance.     Plan Discussed with: CRNA  Anesthesia Plan Comments:         Anesthesia Quick Evaluation

## 2015-06-05 NOTE — Transfer of Care (Signed)
Immediate Anesthesia Transfer of Care Note  Patient: Rebecca Rogers  Procedure(s) Performed: Procedure(s): COLONOSCOPY WITH PROPOFOL (N/A) POLYPECTOMY  Patient Location: PACU  Anesthesia Type: MAC  Level of Consciousness: awake, alert  and patient cooperative  Airway and Oxygen Therapy: Patient Spontanous Breathing and Patient connected to supplemental oxygen  Post-op Assessment: Post-op Vital signs reviewed, Patient's Cardiovascular Status Stable, Respiratory Function Stable, Patent Airway and No signs of Nausea or vomiting  Post-op Vital Signs: Reviewed and stable  Complications: No apparent anesthesia complications

## 2015-06-08 ENCOUNTER — Encounter: Payer: Self-pay | Admitting: Gastroenterology

## 2015-06-09 ENCOUNTER — Encounter: Payer: Self-pay | Admitting: Gastroenterology

## 2015-06-11 ENCOUNTER — Encounter: Payer: Self-pay | Admitting: Gastroenterology

## 2015-06-15 ENCOUNTER — Telehealth: Payer: Self-pay

## 2015-06-15 NOTE — Telephone Encounter (Signed)
Pt notified of results. Added to recall list to repeat colonoscopy in December.

## 2015-06-15 NOTE — Telephone Encounter (Signed)
-----   Message from Lucilla Lame, MD sent at 06/11/2015  5:00 PM EDT ----- Let the patient know that the polyp had some adenomatous changes that require repeat inspection of the area in 6 months with a repeat colonoscopy.

## 2015-07-31 ENCOUNTER — Other Ambulatory Visit: Payer: 59

## 2015-07-31 DIAGNOSIS — R7989 Other specified abnormal findings of blood chemistry: Secondary | ICD-10-CM

## 2015-07-31 DIAGNOSIS — R946 Abnormal results of thyroid function studies: Secondary | ICD-10-CM | POA: Diagnosis not present

## 2015-08-01 LAB — TSH: TSH: 3.82 u[IU]/mL (ref 0.450–4.500)

## 2015-08-01 LAB — T4, FREE: FREE T4: 1.11 ng/dL (ref 0.82–1.77)

## 2015-09-17 ENCOUNTER — Other Ambulatory Visit: Payer: Self-pay

## 2015-09-17 MED ORDER — NYSTATIN-TRIAMCINOLONE 100000-0.1 UNIT/GM-% EX CREA: 1.0000 "application " | TOPICAL_CREAM | Freq: Two times a day (BID) | CUTANEOUS | 1 refills | Status: DC

## 2015-11-06 ENCOUNTER — Other Ambulatory Visit: Payer: Self-pay

## 2015-11-06 ENCOUNTER — Telehealth: Payer: Self-pay | Admitting: Gastroenterology

## 2015-11-06 NOTE — Telephone Encounter (Signed)
Z86.010 Personal Hx of polyps [facility] 12/04/2015 Dr. Allen Norris [facility])  Pre cert

## 2015-11-06 NOTE — Telephone Encounter (Signed)
Pt needs appt set up for 6 mo repeat colonoscopy. Pt prefers Dec 1 if possible. Please advise.

## 2015-11-06 NOTE — Telephone Encounter (Signed)
Gastroenterology Pre-Procedure Review  Request Date: 12/04/15 Requesting Physician:   PATIENT REVIEW QUESTIONS: The patient responded to the following health history questions as indicated:    1. Are you having any GI issues? no 2. Do you have a personal history of Polyps? yes (Benign) 3. Do you have a family history of Colon Cancer or Polyps? no 4. Diabetes Mellitus? no 5. Joint replacements in the past 12 months?no 6. Major health problems in the past 3 months?no 7. Any artificial heart valves, MVP, or defibrillator?no    MEDICATIONS & ALLERGIES:    Patient reports the following regarding taking any anticoagulation/antiplatelet therapy:   Plavix, Coumadin, Eliquis, Xarelto, Lovenox, Pradaxa, Brilinta, or Effient? no Aspirin? no  Patient confirms/reports the following medications:  Current Outpatient Prescriptions  Medication Sig Dispense Refill  . sertraline (ZOLOFT) 50 MG tablet Take 1.5 tablets (75 mg total) by mouth daily. 1 & 1/2 tab daily 135 tablet 4  . triamterene-hydrochlorothiazide (MAXZIDE-25) 37.5-25 MG tablet Take 1 tablet by mouth daily. 90 tablet 4   No current facility-administered medications for this visit.     Patient confirms/reports the following allergies:  No Known Allergies  No orders of the defined types were placed in this encounter.   AUTHORIZATION INFORMATION Primary Insurance: 1D#: Group #:  Secondary Insurance: 1D#: Group #:  SCHEDULE INFORMATION: Date: 12/04/15 Time: Location: [facility]

## 2015-11-10 NOTE — Telephone Encounter (Signed)
Pre cert is not required

## 2015-11-25 ENCOUNTER — Encounter: Payer: Self-pay | Admitting: *Deleted

## 2015-12-04 ENCOUNTER — Ambulatory Visit: Payer: 59 | Admitting: Anesthesiology

## 2015-12-04 ENCOUNTER — Encounter
Admission: RE | Disposition: A | Discharge status: Home / Self Care | Payer: Self-pay | Source: Ambulatory Visit | Attending: Gastroenterology

## 2015-12-04 ENCOUNTER — Ambulatory Visit
Admission: RE | Admit: 2015-12-04 | Discharge: 2015-12-04 | Disposition: A | Discharge status: Home / Self Care | Payer: 59 | Source: Ambulatory Visit | Attending: Gastroenterology | Admitting: Gastroenterology

## 2015-12-04 DIAGNOSIS — D123 Benign neoplasm of transverse colon: Secondary | ICD-10-CM | POA: Diagnosis not present

## 2015-12-04 DIAGNOSIS — Z860100 Personal history of colon polyps, unspecified: Secondary | ICD-10-CM

## 2015-12-04 DIAGNOSIS — Z79899 Other long term (current) drug therapy: Secondary | ICD-10-CM | POA: Diagnosis not present

## 2015-12-04 DIAGNOSIS — K635 Polyp of colon: Secondary | ICD-10-CM | POA: Diagnosis not present

## 2015-12-04 DIAGNOSIS — K640 First degree hemorrhoids: Secondary | ICD-10-CM | POA: Diagnosis not present

## 2015-12-04 DIAGNOSIS — F419 Anxiety disorder, unspecified: Secondary | ICD-10-CM | POA: Diagnosis not present

## 2015-12-04 DIAGNOSIS — I10 Essential (primary) hypertension: Secondary | ICD-10-CM | POA: Diagnosis not present

## 2015-12-04 DIAGNOSIS — Z1211 Encounter for screening for malignant neoplasm of colon: Secondary | ICD-10-CM | POA: Insufficient documentation

## 2015-12-04 HISTORY — PX: POLYPECTOMY: SHX5525

## 2015-12-04 HISTORY — PX: COLONOSCOPY WITH PROPOFOL: SHX5780

## 2015-12-04 SURGERY — COLONOSCOPY WITH PROPOFOL
Anesthesia: Monitor Anesthesia Care | Wound class: Contaminated

## 2015-12-04 MED ORDER — LIDOCAINE HCL (CARDIAC) 20 MG/ML IV SOLN
INTRAVENOUS | Status: DC | PRN
  Administered 2015-12-04: 40 mg via INTRAVENOUS

## 2015-12-04 MED ORDER — PROPOFOL 10 MG/ML IV BOLUS
INTRAVENOUS | Status: DC | PRN
  Administered 2015-12-04 (×2): 20 mg via INTRAVENOUS
  Administered 2015-12-04: 40 mg via INTRAVENOUS
  Administered 2015-12-04: 30 mg via INTRAVENOUS
  Administered 2015-12-04: 70 mg via INTRAVENOUS
  Administered 2015-12-04: 20 mg via INTRAVENOUS
  Administered 2015-12-04: 40 mg via INTRAVENOUS
  Administered 2015-12-04 (×2): 20 mg via INTRAVENOUS

## 2015-12-04 MED ORDER — LACTATED RINGERS IV SOLN
INTRAVENOUS | Status: DC
  Administered 2015-12-04: 08:00:00 via INTRAVENOUS

## 2015-12-04 MED ORDER — STERILE WATER FOR IRRIGATION IR SOLN
Status: DC | PRN
  Administered 2015-12-04: 09:00:00

## 2015-12-04 SURGICAL SUPPLY — 23 items

## 2015-12-04 NOTE — Anesthesia Postprocedure Evaluation (Signed)
Anesthesia Post Note  Patient: Rebecca Rogers  Procedure(s) Performed: Procedure(s) (LRB): COLONOSCOPY WITH PROPOFOL (N/A) POLYPECTOMY  Patient location during evaluation: PACU Anesthesia Type: MAC Level of consciousness: awake and alert and oriented Pain management: satisfactory to patient Vital Signs Assessment: post-procedure vital signs reviewed and stable Respiratory status: spontaneous breathing, nonlabored ventilation and respiratory function stable Cardiovascular status: blood pressure returned to baseline and stable Postop Assessment: Adequate PO intake and No signs of nausea or vomiting Anesthetic complications: no    Raliegh Ip

## 2015-12-04 NOTE — Anesthesia Procedure Notes (Signed)
Procedure Name: MAC Performed by: Ashima Shrake Pre-anesthesia Checklist: Patient identified, Emergency Drugs available, Suction available, Timeout performed and Patient being monitored Patient Re-evaluated:Patient Re-evaluated prior to inductionOxygen Delivery Method: Nasal cannula Placement Confirmation: positive ETCO2       

## 2015-12-04 NOTE — H&P (Signed)
  Rebecca Lame, MD [facility] [address] [place],  [address] Phone: [phone] Fax : [phone]  Primary Care Physician:  No primary care provider on file. Primary Gastroenterologist:  Dr. Allen Norris  Pre-Procedure History & Physical: HPI:  Rebecca Rogers is a 59 y.o. female is here for an colonoscopy.   Past Medical History:  Diagnosis Date  . Adenomyosis   . Anxiety   . Cyst of right breast    4-5 oclock 1/2 cm  . Hypertension    bordeline  . Increased BMI     Past Surgical History:  Procedure Laterality Date  . ABDOMINAL HYSTERECTOMY     lsh- adenomysis and fibroids  . COLONOSCOPY WITH PROPOFOL N/A 06/05/2015   Procedure: COLONOSCOPY WITH PROPOFOL;  Surgeon: Rebecca Lame, MD;  Location: [place];  Service: Endoscopy;  Laterality: N/A;  . LAPAROSCOPIC SALPINGOOPHERECTOMY     prophylactic  . POLYPECTOMY  06/05/2015   Procedure: POLYPECTOMY;  Surgeon: Rebecca Lame, MD;  Location: [place];  Service: Endoscopy;;  . TONSILLECTOMY  1965    Prior to Admission medications   Medication Sig Start Date End Date Taking? Authorizing Provider  sertraline (ZOLOFT) 50 MG tablet Take 1.5 tablets (75 mg total) by mouth daily. 1 & 1/2 tab daily 04/07/15  Yes Alanda Slim Defrancesco, MD  triamterene-hydrochlorothiazide (MAXZIDE-25) 37.5-25 MG tablet Take 1 tablet by mouth daily. 04/07/15   Brayton Mars, MD    Allergies as of 11/06/2015  . (No Known Allergies)    Family History  Problem Relation Age of Onset  . Ovarian cancer Mother   . Prostate cancer Father   . Breast cancer Neg Hx   . Colon cancer Neg Hx   . Diabetes Neg Hx   . Heart disease Neg Hx     Social History   Social History  . Marital status: Married    Spouse name: N/A  . Number of children: N/A  . Years of education: N/A   Occupational History  . Not on file.   Social History Main Topics  . Smoking status: Never Smoker  . Smokeless tobacco: Never Used  . Alcohol use No  .  Drug use: No  . Sexual activity: Not Currently   Other Topics Concern  . Not on file   Social History Narrative  . No narrative on file    Review of Systems: See HPI, otherwise negative ROS  Physical Exam: BP 137/74   Pulse 68   Temp 98.7 F (37.1 C) (Tympanic)   Resp 16   Ht 5\' 6"  (1.676 m)   Wt 187 lb (84.8 kg)   SpO2 95%   BMI 30.18 kg/m  General:   Alert,  pleasant and cooperative in NAD Head:  Normocephalic and atraumatic. Neck:  Supple; no masses or thyromegaly. Lungs:  Clear throughout to auscultation.    Heart:  Regular rate and rhythm. Abdomen:  Soft, nontender and nondistended. Normal bowel sounds, without guarding, and without rebound.   Neurologic:  Alert and  oriented x4;  grossly normal neurologically.  Impression/Plan: Rebecca Rogers is here for an colonoscopy to be performed for history of a polyp  Risks, benefits, limitations, and alternatives regarding  colonoscopy have been reviewed with the patient.  Questions have been answered.  All parties agreeable.   Rebecca Lame, MD  12/04/2015, 7:52 AM

## 2015-12-04 NOTE — Anesthesia Preprocedure Evaluation (Signed)
Anesthesia Evaluation  Patient identified by MRN, date of birth, ID band Patient awake    Reviewed: Allergy & Precautions, H&P , NPO status , Patient's Chart, lab work & pertinent test results  Airway Mallampati: II  TM Distance: >3 FB Neck ROM: full    Dental no notable dental hx.    Pulmonary    Pulmonary exam normal        Cardiovascular hypertension, Normal cardiovascular exam     Neuro/Psych PSYCHIATRIC DISORDERS    GI/Hepatic   Endo/Other    Renal/GU      Musculoskeletal   Abdominal   Peds  Hematology   Anesthesia Other Findings   Reproductive/Obstetrics                             Anesthesia Physical Anesthesia Plan  ASA: II  Anesthesia Plan: MAC   Post-op Pain Management:    Induction:   Airway Management Planned:   Additional Equipment:   Intra-op Plan:   Post-operative Plan:   Informed Consent: I have reviewed the patients History and Physical, chart, labs and discussed the procedure including the risks, benefits and alternatives for the proposed anesthesia with the patient or authorized representative who has indicated his/her understanding and acceptance.     Plan Discussed with:   Anesthesia Plan Comments:         Anesthesia Quick Evaluation  

## 2015-12-04 NOTE — Discharge Instructions (Signed)

## 2015-12-04 NOTE — Transfer of Care (Signed)
Immediate Anesthesia Transfer of Care Note  Patient: Rebecca Rogers  Procedure(s) Performed: Procedure(s): COLONOSCOPY WITH PROPOFOL (N/A) POLYPECTOMY  Patient Location: PACU  Anesthesia Type: MAC  Level of Consciousness: awake, alert  and patient cooperative  Airway and Oxygen Therapy: Patient Spontanous Breathing and Patient connected to supplemental oxygen  Post-op Assessment: Post-op Vital signs reviewed, Patient's Cardiovascular Status Stable, Respiratory Function Stable, Patent Airway and No signs of Nausea or vomiting  Post-op Vital Signs: Reviewed and stable  Complications: No apparent anesthesia complications

## 2015-12-04 NOTE — Op Note (Signed)
[facility] Gastroenterology Patient Name: Rebecca Rogers Procedure Date: 12/04/2015 8:33 AM MRN: [id number] Account #: [id number] Date of Birth: [birth year] Admit Type: Outpatient Age: 60 Room: [facility] OR ROOM 01 Gender: Female Note Status: Finalized Procedure:            Colonoscopy Indications:          High risk colon cancer surveillance: Personal history                        of colonic polyps Providers:            Lucilla Lame MD, MD Referring MD:         Alanda Slim. Defrancesco, MD (Referring MD) Medicines:            Propofol per Anesthesia Complications:        No immediate complications. Procedure:            Pre-Anesthesia Assessment:                       - Prior to the procedure, a History and Physical was                        performed, and patient medications and allergies were                        reviewed. The patient's tolerance of previous                        anesthesia was also reviewed. The risks and benefits of                        the procedure and the sedation options and risks were                        discussed with the patient. All questions were                        answered, and informed consent was obtained. Prior                        Anticoagulants: The patient has taken no previous                        anticoagulant or antiplatelet agents. ASA Grade                        Assessment: II - A patient with mild systemic disease.                        After reviewing the risks and benefits, the patient was                        deemed in satisfactory condition to undergo the                        procedure.                       After obtaining informed consent, the colonoscope was  passed under direct vision. Throughout the procedure,                        the patient's blood pressure, pulse, and oxygen                        saturations were monitored continuously. The Olympus     CF-HQ190L Colonoscope (S#. [id number]) was introduced                        through the anus and advanced to the the cecum,                        identified by appendiceal orifice and ileocecal valve.                        The colonoscopy was performed without difficulty. The                        patient tolerated the procedure well. The quality of                        the bowel preparation was excellent. Findings:      The perianal and digital rectal examinations were normal.      A 4 mm polyp was found in the transverse colon. The polyp was sessile.       The polyp was removed with a cold biopsy forceps. Resection and       retrieval were complete.      Non-bleeding internal hemorrhoids were found during retroflexion. The       hemorrhoids were Grade I (internal hemorrhoids that do not prolapse). Impression:           - One 4 mm polyp in the transverse colon, removed with                        a cold biopsy forceps. Resected and retrieved.                       - Non-bleeding internal hemorrhoids. Recommendation:       - Discharge patient to home.                       - Resume previous diet.                       - Continue present medications.                       - Await pathology results.                       - Repeat colonoscopy in 5 years for surveillance. Procedure Code(s):    --- Professional ---                       [phone], Colonoscopy, flexible; with biopsy, single or                        multiple Diagnosis Code(s):    --- Professional ---  Z86.010, Personal history of colonic polyps                       D12.3, Benign neoplasm of transverse colon (hepatic                        flexure or splenic flexure) CPT copyright 2016 American Medical Association. All rights reserved. The codes documented in this report are preliminary and upon coder review may  be revised to meet current compliance requirements. Lucilla Lame MD, MD 12/04/2015 8:57:35  AM This report has been signed electronically. Number of Addenda: 0 Note Initiated On: 12/04/2015 8:33 AM Scope Withdrawal Time: 0 hours 7 minutes 23 seconds  Total Procedure Duration: 0 hours 13 minutes 13 seconds       [facility]

## 2015-12-07 ENCOUNTER — Encounter: Payer: Self-pay | Admitting: Gastroenterology

## 2015-12-08 LAB — SURGICAL PATHOLOGY

## 2015-12-10 ENCOUNTER — Encounter: Payer: Self-pay | Admitting: Gastroenterology

## 2016-01-27 ENCOUNTER — Other Ambulatory Visit: Payer: Self-pay | Admitting: Obstetrics and Gynecology

## 2016-01-27 MED ORDER — ALBUTEROL SULFATE HFA 108 (90 BASE) MCG/ACT IN AERS: 2.0000 | INHALATION_SPRAY | Freq: Four times a day (QID) | RESPIRATORY_TRACT | 1 refills | Status: DC | PRN

## 2016-01-27 MED ORDER — CEFDINIR 300 MG PO CAPS: 300.0000 mg | ORAL_CAPSULE | Freq: Two times a day (BID) | ORAL | 1 refills | Status: DC

## 2016-03-23 ENCOUNTER — Other Ambulatory Visit: Payer: Self-pay

## 2016-03-23 DIAGNOSIS — F419 Anxiety disorder, unspecified: Secondary | ICD-10-CM

## 2016-03-23 MED ORDER — SERTRALINE HCL 50 MG PO TABS: 75.0000 mg | ORAL_TABLET | Freq: Every day | ORAL | 4 refills | Status: DC

## 2016-04-05 NOTE — Progress Notes (Addendum)
Patient ID: Rebecca Rogers, female   DOB: Feb 11, 1955, 61 y.o.   MRN: [id number] ANNUAL PREVENTATIVE CARE GYN    ENCOUNTER NOTE  Subjective:       Rebecca Rogers is a 61 y.o. G0P0000 female here for a routine annual gynecologic exam.  Current complaints: 1.  none   61 year old white female para 0, menopausal, on no hormone replacement therapy, asymptomatic, with history of adenomyosis, status post [facility], with family history of ovarian cancer in mom, status post prophylactic BSO 8 years ago, presents for routine physical. Interval history: Status post core needle biopsies of right breast abnormality found on screening mammogram mammogram 2016, benign; status post interval mammogram October 2016-BI-RADS 3, 04/2015 birad 2 return to yearly screening History of increased BMI; weight stable History of chronic peripheral edema, controlled with triamterene hydrochlorothiazide History of anxiety/depression, controlled on Zoloft 75 mg a day History of colon polyps; screening colonoscopy done 12/04/2015- polyp removed- neg- q  3 years repeat colonoscopy     Gynecologic History No LMP recorded. Patient has had a hysterectomy. Contraception: status post hysterectomy; [place]; pathology-adenomyosis Family history ovarian cancer; status post laparoscopic BSO Last Pap: 03/2013 neg/neg.  Last mammogram: 04/2015 birad 2  Obstetric History OB History  Gravida Para Term Preterm AB Living  0 0 0 0 0 0  SAB TAB Ectopic Multiple Live Births  0 0 0 0          Past Medical History:  Diagnosis Date  . Adenomyosis   . Anxiety   . Cyst of right breast    4-5 oclock 1/2 cm  . Hypertension    bordeline  . Increased BMI     Past Surgical History:  Procedure Laterality Date  . ABDOMINAL HYSTERECTOMY     lsh- adenomysis and fibroids  . COLONOSCOPY WITH PROPOFOL N/A 06/05/2015   Procedure: COLONOSCOPY WITH PROPOFOL;  Surgeon: Lucilla Lame, MD;  Location: [place];  Service: Endoscopy;  Laterality:  N/A;  . COLONOSCOPY WITH PROPOFOL N/A 12/04/2015   Procedure: COLONOSCOPY WITH PROPOFOL;  Surgeon: Lucilla Lame, MD;  Location: [place];  Service: Endoscopy;  Laterality: N/A;  . LAPAROSCOPIC SALPINGOOPHERECTOMY     prophylactic  . POLYPECTOMY  06/05/2015   Procedure: POLYPECTOMY;  Surgeon: Lucilla Lame, MD;  Location: [place];  Service: Endoscopy;;  . POLYPECTOMY  12/04/2015   Procedure: POLYPECTOMY;  Surgeon: Lucilla Lame, MD;  Location: [place];  Service: Endoscopy;;  . TONSILLECTOMY  1965    Current Outpatient Prescriptions on File Prior to Visit  Medication Sig Dispense Refill  . albuterol (PROAIR HFA) 108 (90 Base) MCG/ACT inhaler Inhale 2 puffs into the lungs every 6 (six) hours as needed for wheezing or shortness of breath. 1 Inhaler 1  . cefdinir (OMNICEF) 300 MG capsule Take 1 capsule (300 mg total) by mouth 2 (two) times daily. 14 capsule 1  . sertraline (ZOLOFT) 50 MG tablet Take 1.5 tablets (75 mg total) by mouth daily. 1 & 1/2 tab daily 135 tablet 4  . triamterene-hydrochlorothiazide (MAXZIDE-25) 37.5-25 MG tablet Take 1 tablet by mouth daily. 90 tablet 4   No current facility-administered medications on file prior to visit.     No Known Allergies  Social History   Social History  . Marital status: Married    Spouse name: N/A  . Number of children: N/A  . Years of education: N/A   Occupational History  . Not on file.   Social History Main Topics  . Smoking status: Never  Smoker  . Smokeless tobacco: Never Used  . Alcohol use No  . Drug use: No  . Sexual activity: Not Currently   Other Topics Concern  . Not on file   Social History Narrative  . No narrative on file    Family History  Problem Relation Age of Onset  . Ovarian cancer Mother   . Prostate cancer Father   . Breast cancer Neg Hx   . Colon cancer Neg Hx   . Diabetes Neg Hx   . Heart disease Neg Hx     The following portions of the patient's history were  reviewed and updated as appropriate: allergies, current medications, past family history, past medical history, past social history, past surgical history and problem list.  Review of Systems Review of Systems  Constitutional: Negative.        No vasomotor symptoms  HENT: Negative.   Eyes: Negative.   Respiratory: Negative.   Cardiovascular: Negative.        Peripheral edema controlled with diuretic  Gastrointestinal: Negative.   Genitourinary: Negative.   Musculoskeletal: Negative.   Skin: Negative.   Neurological: Negative.   Endo/Heme/Allergies: Negative.   Psychiatric/Behavioral: The patient is nervous/anxious.        Anxiety symptoms stable on Zoloft     Objective:   BP 110/70   Pulse 80   Ht 5\' 6"  (1.676 m)   Wt 188 lb 14.4 oz (85.7 kg)   BMI 30.49 kg/m  CONSTITUTIONAL: Well-developed, well-nourished female in no acute distress.  PSYCHIATRIC: Normal mood and affect. Normal behavior. Normal judgment and thought content. [place]: Alert and oriented to person, place, and time. Normal muscle tone coordination. No cranial nerve deficit noted. HENT:  Normocephalic, atraumatic, External right and left ear normal. Oropharynx is clear and moist EYES: Conjunctivae and EOM are normal.  No scleral icterus.  NECK: Normal range of motion, supple, no masses.  Normal thyroid.  SKIN: Skin is warm and dry. No rash noted. Not diaphoretic. No erythema. No pallor. CARDIOVASCULAR: Normal heart rate noted, regular rhythm, no murmur. RESPIRATORY: Clear to auscultation bilaterally. Effort and breath sounds normal, no problems with respiration noted. BREASTS: Symmetric in size. No masses, skin changes, nipple drainage, or lymphadenopathy. ABDOMEN: Soft, normal bowel sounds, no distention noted.  No tenderness, rebound or guarding.  BLADDER: Normal PELVIC:  External Genitalia: Normal  BUS: Normal  Vagina: Normal; thin white secretions  Cervix: Normal; no cervical motion tenderness  Uterus:  Surgically absent  Adnexa: Surgically absent; no palpable masses  RV: External Exam NormaI, No Rectal Masses and Normal Sphincter tone  MUSCULOSKELETAL: Normal range of motion. No tenderness.  No cyanosis, clubbing, or edema.  2+ distal pulses. LYMPHATIC: No Axillary, Supraclavicular, or Inguinal Adenopathy.    Assessment:   Annual gynecologic examination 61 y.o. Contraception: status post hysterectomy LSH; status post laparoscopic BSO bmi-30, stable Surgical menopause, asymptomatic Peripheral edema, controlled with diuretic History of colon polyps,Status post colonoscopy 2017  Plan:  Pap: pap w/hpv Mammogram: Ordered Stool Guaiac Testing:  Labs: renal panel, vit d, a1c,lipid, tsh Routine preventative health maintenance measures emphasized: Exercise/Diet/Weight control, Tobacco Warnings, Alcohol/Substance use risks and Stress Management Refill Zoloft Refill triamterene hydrochlorothiazide Return to [place], CMA  Brayton Mars, MD   Note: This dictation was prepared with Dragon dictation along with smaller phrase technology. Any transcriptional errors that result from this process are unintentional.

## 2016-04-07 ENCOUNTER — Encounter: Payer: Self-pay | Admitting: Obstetrics and Gynecology

## 2016-04-07 ENCOUNTER — Ambulatory Visit (INDEPENDENT_AMBULATORY_CARE_PROVIDER_SITE_OTHER): Payer: 59 | Admitting: Obstetrics and Gynecology

## 2016-04-07 VITALS — BP 110/70 | HR 80 | Ht 66.0 in | Wt 188.9 lb

## 2016-04-07 DIAGNOSIS — Z8041 Family history of malignant neoplasm of ovary: Secondary | ICD-10-CM

## 2016-04-07 DIAGNOSIS — F419 Anxiety disorder, unspecified: Secondary | ICD-10-CM

## 2016-04-07 DIAGNOSIS — I10 Essential (primary) hypertension: Secondary | ICD-10-CM | POA: Diagnosis not present

## 2016-04-07 DIAGNOSIS — Z01419 Encounter for gynecological examination (general) (routine) without abnormal findings: Secondary | ICD-10-CM

## 2016-04-07 DIAGNOSIS — Z90722 Acquired absence of ovaries, bilateral: Secondary | ICD-10-CM

## 2016-04-07 DIAGNOSIS — N8000 Endometriosis of the uterus, unspecified: Secondary | ICD-10-CM

## 2016-04-07 DIAGNOSIS — Z90711 Acquired absence of uterus with remaining cervical stump: Secondary | ICD-10-CM

## 2016-04-07 DIAGNOSIS — Z1231 Encounter for screening mammogram for malignant neoplasm of breast: Secondary | ICD-10-CM

## 2016-04-07 DIAGNOSIS — Z1239 Encounter for other screening for malignant neoplasm of breast: Secondary | ICD-10-CM

## 2016-04-07 DIAGNOSIS — R638 Other symptoms and signs concerning food and fluid intake: Secondary | ICD-10-CM

## 2016-04-07 MED ORDER — TRIAMTERENE-HCTZ 37.5-25 MG PO TABS: 1.0000 | ORAL_TABLET | Freq: Every day | ORAL | 4 refills | Status: DC

## 2016-04-07 NOTE — Patient Instructions (Signed)
1. Pap smear is done 2. Mammogram is ordered 3. Stool guaiac cards are deferred due to colonoscopy this year 4. Continue with healthy eating and exercise with controlled weight loss 5. Continue with calcium and vitamin D supplementation 6. Return in 1 year for annual exam  Health Maintenance for Postmenopausal Women Menopause is a normal process in which your reproductive ability comes to an end. This process happens gradually over a span of months to years, usually between the ages of 30 and 24. Menopause is complete when you have missed 12 consecutive menstrual periods. It is important to talk with your health care provider about some of the most common conditions that affect postmenopausal women, such as heart disease, cancer, and bone loss (osteoporosis). Adopting a healthy lifestyle and getting preventive care can help to promote your health and wellness. Those actions can also lower your chances of developing some of these common conditions. What should I know about menopause? During menopause, you may experience a number of symptoms, such as:  Moderate-to-severe hot flashes.  Night sweats.  Decrease in sex drive.  Mood swings.  Headaches.  Tiredness.  Irritability.  Memory problems.  Insomnia. Choosing to treat or not to treat menopausal changes is an individual decision that you make with your health care provider. What should I know about hormone replacement therapy and supplements? Hormone therapy products are effective for treating symptoms that are associated with menopause, such as hot flashes and night sweats. Hormone replacement carries certain risks, especially as you become older. If you are thinking about using estrogen or estrogen with progestin treatments, discuss the benefits and risks with your health care provider. What should I know about heart disease and stroke? Heart disease, heart attack, and stroke become more likely as you age. This may be due, in part,  to the hormonal changes that your body experiences during menopause. These can affect how your body processes dietary fats, triglycerides, and cholesterol. Heart attack and stroke are both medical emergencies. There are many things that you can do to help prevent heart disease and stroke:  Have your blood pressure checked at least every 1-2 years. High blood pressure causes heart disease and increases the risk of stroke.  If you are 46-72 years old, ask your health care provider if you should take aspirin to prevent a heart attack or a stroke.  Do not use any tobacco products, including cigarettes, chewing tobacco, or electronic cigarettes. If you need help quitting, ask your health care provider.  It is important to eat a healthy diet and maintain a healthy weight.  Be sure to include plenty of vegetables, fruits, low-fat dairy products, and lean protein.  Avoid eating foods that are high in solid fats, added sugars, or salt (sodium).  Get regular exercise. This is one of the most important things that you can do for your health.  Try to exercise for at least 150 minutes each week. The type of exercise that you do should increase your heart rate and make you sweat. This is known as moderate-intensity exercise.  Try to do strengthening exercises at least twice each week. Do these in addition to the moderate-intensity exercise.  Know your numbers.Ask your health care provider to check your cholesterol and your blood glucose. Continue to have your blood tested as directed by your health care provider. What should I know about cancer screening? There are several types of cancer. Take the following steps to reduce your risk and to catch any cancer development as  early as possible. Breast Cancer  Practice breast self-awareness.  This means understanding how your breasts normally appear and feel.  It also means doing regular breast self-exams. Let your health care provider know about any  changes, no matter how small.  If you are 19 or older, have a clinician do a breast exam (clinical breast exam or CBE) every year. Depending on your age, family history, and medical history, it may be recommended that you also have a yearly breast X-ray (mammogram).  If you have a family history of breast cancer, talk with your health care provider about genetic screening.  If you are at high risk for breast cancer, talk with your health care provider about having an MRI and a mammogram every year.  Breast cancer (BRCA) gene test is recommended for women who have family members with BRCA-related cancers. Results of the assessment will determine the need for genetic counseling and BRCA1 and for BRCA2 testing. BRCA-related cancers include these types:  Breast. This occurs in males or females.  Ovarian.  Tubal. This may also be called fallopian tube cancer.  Cancer of the abdominal or pelvic lining (peritoneal cancer).  Prostate.  Pancreatic. Cervical, Uterine, and Ovarian Cancer  Your health care provider may recommend that you be screened regularly for cancer of the pelvic organs. These include your ovaries, uterus, and vagina. This screening involves a pelvic exam, which includes checking for microscopic changes to the surface of your cervix (Pap test).  For women ages 21-65, health care providers may recommend a pelvic exam and a Pap test every three years. For women ages 67-65, they may recommend the Pap test and pelvic exam, combined with testing for human papilloma virus (HPV), every five years. Some types of HPV increase your risk of cervical cancer. Testing for HPV may also be done on women of any age who have unclear Pap test results.  Other health care providers may not recommend any screening for nonpregnant women who are considered low risk for pelvic cancer and have no symptoms. Ask your health care provider if a screening pelvic exam is right for you.  If you have had past  treatment for cervical cancer or a condition that could lead to cancer, you need Pap tests and screening for cancer for at least 20 years after your treatment. If Pap tests have been discontinued for you, your risk factors (such as having a new sexual partner) need to be reassessed to determine if you should start having screenings again. Some women have medical problems that increase the chance of getting cervical cancer. In these cases, your health care provider may recommend that you have screening and Pap tests more often.  If you have a family history of uterine cancer or ovarian cancer, talk with your health care provider about genetic screening.  If you have vaginal bleeding after reaching menopause, tell your health care provider.  There are currently no reliable tests available to screen for ovarian cancer. Lung Cancer  Lung cancer screening is recommended for adults 25-53 years old who are at high risk for lung cancer because of a history of smoking. A yearly low-dose CT scan of the lungs is recommended if you:  Currently smoke.  Have a history of at least 30 pack-years of smoking and you currently smoke or have quit within the past 15 years. A pack-year is smoking an average of one pack of cigarettes per day for one year. Yearly screening should:  Continue until it has been 15  years since you quit.  Stop if you develop a health problem that would prevent you from having lung cancer treatment. Colorectal Cancer  This type of cancer can be detected and can often be prevented.  Routine colorectal cancer screening usually begins at age 54 and continues through age 74.  If you have risk factors for colon cancer, your health care provider may recommend that you be screened at an earlier age.  If you have a family history of colorectal cancer, talk with your health care provider about genetic screening.  Your health care provider may also recommend using home test kits to check for  hidden blood in your stool.  A small camera at the end of a tube can be used to examine your colon directly (sigmoidoscopy or colonoscopy). This is done to check for the earliest forms of colorectal cancer.  Direct examination of the colon should be repeated every 5-10 years until age 66. However, if early forms of precancerous polyps or small growths are found or if you have a family history or genetic risk for colorectal cancer, you may need to be screened more often. Skin Cancer  Check your skin from head to toe regularly.  Monitor any moles. Be sure to tell your health care provider:  About any new moles or changes in moles, especially if there is a change in a mole's shape or color.  If you have a mole that is larger than the size of a pencil eraser.  If any of your family members has a history of skin cancer, especially at a young age, talk with your health care provider about genetic screening.  Always use sunscreen. Apply sunscreen liberally and repeatedly throughout the day.  Whenever you are outside, protect yourself by wearing long sleeves, pants, a wide-brimmed hat, and sunglasses. What should I know about osteoporosis? Osteoporosis is a condition in which bone destruction happens more quickly than new bone creation. After menopause, you may be at an increased risk for osteoporosis. To help prevent osteoporosis or the bone fractures that can happen because of osteoporosis, the following is recommended:  If you are 46-1 years old, get at least 1,000 mg of calcium and at least 600 mg of vitamin D per day.  If you are older than age 81 but younger than age 64, get at least 1,200 mg of calcium and at least 600 mg of vitamin D per day.  If you are older than age 21, get at least 1,200 mg of calcium and at least 800 mg of vitamin D per day. Smoking and excessive alcohol intake increase the risk of osteoporosis. Eat foods that are rich in calcium and vitamin D, and do weight-bearing  exercises several times each week as directed by your health care provider. What should I know about how menopause affects my mental health? Depression may occur at any age, but it is more common as you become older. Common symptoms of depression include:  Low or sad mood.  Changes in sleep patterns.  Changes in appetite or eating patterns.  Feeling an overall lack of motivation or enjoyment of activities that you previously enjoyed.  Frequent crying spells. Talk with your health care provider if you think that you are experiencing depression. What should I know about immunizations? It is important that you get and maintain your immunizations. These include:  Tetanus, diphtheria, and pertussis (Tdap) booster vaccine.  Influenza every year before the flu season begins.  Pneumonia vaccine.  Shingles vaccine. Your health  care provider may also recommend other immunizations. This information is not intended to replace advice given to you by your health care provider. Make sure you discuss any questions you have with your health care provider. Document Released: 02/11/2005 Document Revised: 07/10/2015 Document Reviewed: 09/23/2014 Elsevier Interactive Patient Education  2017 [employer].

## 2016-04-08 DIAGNOSIS — Z01419 Encounter for gynecological examination (general) (routine) without abnormal findings: Secondary | ICD-10-CM | POA: Diagnosis not present

## 2016-04-08 DIAGNOSIS — R638 Other symptoms and signs concerning food and fluid intake: Secondary | ICD-10-CM | POA: Diagnosis not present

## 2016-04-09 LAB — PAP IG AND HPV HIGH-RISK
HPV, HIGH-RISK: POSITIVE — AB
PAP Smear Comment: 0

## 2016-04-09 LAB — TSH: TSH: 5.08 u[IU]/mL — ABNORMAL HIGH (ref 0.450–4.500)

## 2016-04-09 LAB — LIPID PANEL
Chol/HDL Ratio: 6 ratio — ABNORMAL HIGH (ref 0.0–4.4)
Cholesterol, Total: 209 mg/dL — ABNORMAL HIGH (ref 100–199)
HDL: 35 mg/dL — ABNORMAL LOW (ref >39)
LDL CALC: 141 mg/dL — AB (ref 0–99)
Triglycerides: 167 mg/dL — ABNORMAL HIGH (ref 0–149)
VLDL CHOLESTEROL CAL: 33 mg/dL (ref 5–40)

## 2016-04-09 LAB — HEMOGLOBIN A1C
Est. average glucose Bld gHb Est-mCnc: 120 mg/dL
Hgb A1c MFr Bld: 5.8 % — ABNORMAL HIGH (ref 4.8–5.6)

## 2016-04-09 LAB — VITAMIN D 25 HYDROXY (VIT D DEFICIENCY, FRACTURES): VIT D 25 HYDROXY: 23.8 ng/mL — AB (ref 30.0–100.0)

## 2016-04-09 LAB — GLUCOSE, RANDOM: Glucose: 103 mg/dL — ABNORMAL HIGH (ref 65–99)

## 2016-04-14 ENCOUNTER — Other Ambulatory Visit: Payer: Self-pay | Admitting: Obstetrics and Gynecology

## 2016-04-14 ENCOUNTER — Encounter: Payer: Self-pay | Admitting: Obstetrics and Gynecology

## 2016-04-14 DIAGNOSIS — E039 Hypothyroidism, unspecified: Secondary | ICD-10-CM | POA: Insufficient documentation

## 2016-04-14 DIAGNOSIS — M2550 Pain in unspecified joint: Secondary | ICD-10-CM | POA: Insufficient documentation

## 2016-04-14 MED ORDER — LEVOTHYROXINE SODIUM 50 MCG PO TABS: 25.0000 ug | ORAL_TABLET | Freq: Every day | ORAL | 1 refills | Status: DC

## 2016-04-14 MED ORDER — IBUPROFEN 800 MG PO TABS: 800.0000 mg | ORAL_TABLET | Freq: Three times a day (TID) | ORAL | 1 refills | Status: DC

## 2016-04-29 ENCOUNTER — Ambulatory Visit
Admission: RE | Admit: 2016-04-29 | Discharge: 2016-04-29 | Disposition: A | Discharge status: Home / Self Care | Payer: 59 | Source: Ambulatory Visit | Attending: Obstetrics and Gynecology | Admitting: Obstetrics and Gynecology

## 2016-04-29 DIAGNOSIS — Z1231 Encounter for screening mammogram for malignant neoplasm of breast: Secondary | ICD-10-CM | POA: Insufficient documentation

## 2016-04-29 DIAGNOSIS — Z1239 Encounter for other screening for malignant neoplasm of breast: Secondary | ICD-10-CM

## 2016-05-04 ENCOUNTER — Encounter: Payer: Self-pay | Admitting: Obstetrics and Gynecology

## 2016-05-04 ENCOUNTER — Ambulatory Visit (INDEPENDENT_AMBULATORY_CARE_PROVIDER_SITE_OTHER): Payer: 59 | Admitting: Obstetrics and Gynecology

## 2016-05-04 VITALS — BP 116/69 | HR 69 | Ht 66.0 in | Wt 186.2 lb

## 2016-05-04 DIAGNOSIS — B977 Papillomavirus as the cause of diseases classified elsewhere: Secondary | ICD-10-CM | POA: Diagnosis not present

## 2016-05-04 DIAGNOSIS — R7989 Other specified abnormal findings of blood chemistry: Secondary | ICD-10-CM

## 2016-05-04 DIAGNOSIS — N870 Mild cervical dysplasia: Secondary | ICD-10-CM

## 2016-05-04 DIAGNOSIS — R946 Abnormal results of thyroid function studies: Secondary | ICD-10-CM

## 2016-05-04 NOTE — Progress Notes (Signed)
Chief complaint: 1. 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 Colposcopy  Nonsmoker. First abnormal Pap smear. Recent new partner.  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 Patient was placed in the dorsal lithotomy position. Speculum was placed in the vagina to facilitate visualization of the cervix. Acetic acid soaked Fox swabs are used to paint the cervix and vagina. The colposcope is used to visualize the cervix with above-noted findings being made. ECC is performed with a rectangular basket. Cervix biopsy at 10:00 is obtained with Tischler forceps. Monsel solution is applied. Blood loss is minimal. Complications are none. Patient tolerated well.  ASSESSMENT: 1. 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 Inadequate colposcopy with evidence of acetowhite epithelium at 7:00  PLAN: 1. ECC 2. Cervix biopsy 10:00 3. Return in 6 months for repeat colposcopy  Brayton Mars, MD  Note: This dictation was prepared with Dragon dictation along with smaller phrase technology. Any transcriptional errors that result from this process are unintentional.

## 2016-05-06 LAB — PATHOLOGY

## 2016-05-18 ENCOUNTER — Other Ambulatory Visit: Payer: Self-pay

## 2016-07-25 ENCOUNTER — Other Ambulatory Visit: Payer: Self-pay

## 2016-07-25 ENCOUNTER — Other Ambulatory Visit: Payer: Self-pay | Admitting: Obstetrics and Gynecology

## 2016-07-25 DIAGNOSIS — R946 Abnormal results of thyroid function studies: Secondary | ICD-10-CM | POA: Diagnosis not present

## 2016-07-25 DIAGNOSIS — R7989 Other specified abnormal findings of blood chemistry: Secondary | ICD-10-CM

## 2016-07-25 NOTE — Addendum Note (Signed)
Addended by: Elouise Munroe on: 07/25/2016 01:40 PM   Modules accepted: Orders

## 2016-07-26 LAB — THYROID PANEL WITH TSH
Free Thyroxine Index: 1.7 (ref 1.2–4.9)
T3 UPTAKE RATIO: 25 % (ref 24–39)
T4 TOTAL: 6.7 ug/dL (ref 4.5–12.0)
TSH: 3.75 u[IU]/mL (ref 0.450–4.500)

## 2016-08-31 DIAGNOSIS — H5213 Myopia, bilateral: Secondary | ICD-10-CM | POA: Diagnosis not present

## 2016-09-27 ENCOUNTER — Encounter: Payer: Self-pay | Admitting: Obstetrics and Gynecology

## 2016-09-27 ENCOUNTER — Ambulatory Visit (INDEPENDENT_AMBULATORY_CARE_PROVIDER_SITE_OTHER): Payer: 59 | Admitting: Obstetrics and Gynecology

## 2016-09-27 VITALS — BP 141/75 | HR 80 | Ht 66.0 in | Wt 190.4 lb

## 2016-09-27 DIAGNOSIS — L739 Follicular disorder, unspecified: Secondary | ICD-10-CM | POA: Diagnosis not present

## 2016-09-28 DIAGNOSIS — L739 Follicular disorder, unspecified: Secondary | ICD-10-CM | POA: Insufficient documentation

## 2016-09-28 NOTE — Progress Notes (Signed)
GYN ENCOUNTER NOTE  Subjective:       Rebecca Rogers is a 61 y.o. G0P0000 female is here for gynecologic evaluation of the following issues:  1. Vulvar lesion Several week history of perineal discomfort. No fevers chills or sweats. No UTI symptoms. No vaginal discharge or vaginal bleeding..     Gynecologic History No LMP recorded. Patient has had a hysterectomy.  Obstetric History OB History  Gravida Para Term Preterm AB Living  0 0 0 0 0 0  SAB TAB Ectopic Multiple Live Births  0 0 0 0          Past Medical History:  Diagnosis Date  . Adenomyosis   . Anxiety   . Cyst of right breast    4-5 oclock 1/2 cm  . Hypertension    bordeline  . Increased BMI     Past Surgical History:  Procedure Laterality Date  . ABDOMINAL HYSTERECTOMY     lsh- adenomysis and fibroids  . BREAST BIOPSY Right 2016   NEG  . COLONOSCOPY WITH PROPOFOL N/A 06/05/2015   Procedure: COLONOSCOPY WITH PROPOFOL;  Surgeon: Lucilla Lame, MD;  Location: [facility];  Service: Endoscopy;  Laterality: N/A;  . COLONOSCOPY WITH PROPOFOL N/A 12/04/2015   Procedure: COLONOSCOPY WITH PROPOFOL;  Surgeon: Lucilla Lame, MD;  Location: [place];  Service: Endoscopy;  Laterality: N/A;  . LAPAROSCOPIC SALPINGOOPHERECTOMY     prophylactic  . POLYPECTOMY  06/05/2015   Procedure: POLYPECTOMY;  Surgeon: Lucilla Lame, MD;  Location: [place];  Service: Endoscopy;;  . POLYPECTOMY  12/04/2015   Procedure: POLYPECTOMY;  Surgeon: Lucilla Lame, MD;  Location: [place];  Service: Endoscopy;;  . TONSILLECTOMY  1965    Current Outpatient Prescriptions on File Prior to Visit  Medication Sig Dispense Refill  . ibuprofen (ADVIL,MOTRIN) 800 MG tablet Take 1 tablet (800 mg total) by mouth 3 (three) times daily. 50 tablet 1  . levothyroxine (SYNTHROID) 50 MCG tablet Take 0.5 tablets (25 mcg total) by mouth daily before breakfast. 90 tablet 1  . sertraline (ZOLOFT) 50 MG tablet Take 1.5 tablets (75 mg  total) by mouth daily. 1 & 1/2 tab daily 135 tablet 4  . triamterene-hydrochlorothiazide (MAXZIDE-25) 37.5-25 MG tablet Take 1 tablet by mouth daily. 90 tablet 4   No current facility-administered medications on file prior to visit.     No Known Allergies  Social History   Social History  . Marital status: Married    Spouse name: N/A  . Number of children: N/A  . Years of education: N/A   Occupational History  . Not on file.   Social History Main Topics  . Smoking status: Never Smoker  . Smokeless tobacco: Never Used  . Alcohol use No  . Drug use: No  . Sexual activity: Not Currently   Other Topics Concern  . Not on file   Social History Narrative  . No narrative on file    Family History  Problem Relation Age of Onset  . Ovarian cancer Mother   . Prostate cancer Father   . Breast cancer Neg Hx   . Colon cancer Neg Hx   . Diabetes Neg Hx   . Heart disease Neg Hx     The following portions of the patient's history were reviewed and updated as appropriate: allergies, current medications, past family history, past medical history, past social history, past surgical history and problem list.  Review of Systems Review of Systems - Per history of  present illness  Objective:   BP (!) 141/75   Pulse 80   Ht 5\' 6"  (1.676 m)   Wt 190 lb 6.4 oz (86.4 kg)   BMI 30.73 kg/m  CONSTITUTIONAL: Well-developed, well-nourished female in no acute distress.  HENT:  Normocephalic, atraumatic.  NECK: Not examined SKIN: Skin is warm and dry. No rash noted. Not diaphoretic. No erythema. No pallor. [place]: Alert and oriented to person, place, and time. PSYCHIATRIC: Normal mood and affect. Normal behavior. Normal judgment and thought content. CARDIOVASCULAR:Not Examined RESPIRATORY: Not Examined BREASTS: Not Examined ABDOMEN: Soft, non distended; Non tender.  No Organomegaly. PELVIC:  External Genitalia: 3 mm posterior fourchette nodule, consistent with inclusion cyst or  folliculitis  BUS: Normal  Vagina: Normal  Cervix: Surgically absent  Uterus: Surgically absent  Bimanual-not done  RV: Normal external exam  Bladder: Nontender MUSCULOSKELETAL: Normal range of motion. No tenderness.  No cyanosis, clubbing, or edema.     Assessment:   Perineal inclusion cyst versus folliculitis  Plan:   Sitz bath daily Return as needed if symptoms worsen  Brayton Mars, MD  Note: This dictation was prepared with Dragon dictation along with smaller phrase technology. Any transcriptional errors that result from this process are unintentional.

## 2016-09-28 NOTE — Patient Instructions (Signed)
1. Cyst baths once or twice daily 2. Return as needed if symptoms worsen Folliculitis Folliculitis is inflammation of the hair follicles. Folliculitis most commonly occurs on the scalp, thighs, legs, back, and buttocks. However, it can occur anywhere on the body. What are the causes? This condition may be caused by:  A bacterial infection (common).  A fungal infection.  A viral infection.  Coming into contact with certain chemicals, especially oils and tars.  Shaving or waxing.  Applying greasy ointments or creams to your skin often.  Long-lasting folliculitis and folliculitis that keeps coming back can be caused by bacteria that live in the nostrils. What increases the risk? This condition is more likely to develop in people with:  A weakened immune system.  Diabetes.  Obesity.  What are the signs or symptoms? Symptoms of this condition include:  Redness.  Soreness.  Swelling.  Itching.  Small white or yellow, pus-filled, itchy spots (pustules) that appear over a reddened area. If there is an infection that goes deep into the follicle, these may develop into a boil (furuncle).  A group of closely packed boils (carbuncle). These tend to form in hairy, sweaty areas of the body.  How is this diagnosed? This condition is diagnosed with a skin exam. To find what is causing the condition, your health care provider may take a sample of one of the pustules or boils for testing. How is this treated? This condition may be treated by:  Applying warm compresses to the affected areas.  Taking an antibiotic medicine or applying an antibiotic medicine to the skin.  Applying or bathing with an antiseptic solution.  Taking an over-the-counter medicine to help with itching.  Having a procedure to drain any pustules or boils. This may be done if a pustule or boil contains a lot of pus or fluid.  Laser hair removal. This may be done to treat long-lasting  folliculitis.  Follow these instructions at home:  If directed, apply heat to the affected area as often as told by your health care provider. Use the heat source that your health care provider recommends, such as a moist heat pack or a heating pad. ? Place a towel between your skin and the heat source. ? Leave the heat on for 20-30 minutes. ? Remove the heat if your skin turns bright red. This is especially important if you are unable to feel pain, heat, or cold. You may have a greater risk of getting burned.  If you were prescribed an antibiotic medicine, use it as told by your health care provider. Do not stop using the antibiotic even if you start to feel better.  Take over-the-counter and prescription medicines only as told by your health care provider.  Do not shave irritated skin.  Keep all follow-up visits as told by your health care provider. This is important. Get help right away if:  You have more redness, swelling, or pain in the affected area.  Red streaks are spreading from the affected area.  You have a fever. This information is not intended to replace advice given to you by your health care provider. Make sure you discuss any questions you have with your health care provider. Document Released: 02/28/2001 Document Revised: 07/10/2015 Document Reviewed: 10/10/2014 Elsevier Interactive Patient Education  2018 [employer].

## 2016-11-08 ENCOUNTER — Encounter: Payer: Self-pay | Admitting: Obstetrics and Gynecology

## 2016-11-08 ENCOUNTER — Ambulatory Visit (INDEPENDENT_AMBULATORY_CARE_PROVIDER_SITE_OTHER): Payer: 59 | Admitting: Obstetrics and Gynecology

## 2016-11-08 VITALS — BP 110/72 | HR 85 | Ht 66.0 in | Wt 191.1 lb

## 2016-11-08 DIAGNOSIS — B977 Papillomavirus as the cause of diseases classified elsewhere: Secondary | ICD-10-CM | POA: Insufficient documentation

## 2016-11-08 DIAGNOSIS — N870 Mild cervical dysplasia: Secondary | ICD-10-CM

## 2016-11-08 DIAGNOSIS — R87610 Atypical squamous cells of undetermined significance on cytologic smear of cervix (ASC-US): Secondary | ICD-10-CM | POA: Diagnosis not present

## 2016-11-08 NOTE — Patient Instructions (Signed)
1.  Pap smear is obtained today 2.  Colposcopy with biopsies are obtained today 3.  Return in 6 months for repeat colposcopy

## 2016-11-08 NOTE — Progress Notes (Signed)
Chief complaint: 1.  Pap/HPV normal/positive 2.  Colposcopy  Patient presents for colposcopy for evaluation of normal/high risk HPV positive Pap smear. Non-smoker. Status post LSH. No history of STI.   OBJECTIVE: BP 110/72   Pulse 85   Ht 5\' 6"  (1.676 m)   Wt 191 lb 1.6 oz (86.7 kg)   BMI 30.84 kg/m  Pleasant female no acute distress.  Alert and oriented. Pelvic exam: External genitalia-normal BUS-normal Vagina-normal Cervix-no gross lesions Uterus-surgically absent  PROCEDURE: Indications: Pap/HPV normal/positive Findings: SCJ not visualized; acetowhite epithelium identified at 10:00 Biopsies:  Pap/HPV 16 and 18  ECC  Cervix 10:00 Description: Verbal consent is obtained.  Patient is placed in the dorsolithotomy position.  Peterson speculum was placed into the vagina to facilitate visualization of the cervix and upper adjacent vagina.  Pap smear/HPV testing is obtained.  The cervix and vagina is painted with acetic acid solution using fox swabs.  Colposcopy demonstrates the above-noted findings.  ECC and cervical biopsy at 10:00 are obtained.  The cervix had to be grasped with a single-tooth tenaculum in order to adequately get the cervical biopsy.  Blood loss is minimal.  Complications are none.  Patient tolerated the procedure well.  ASSESSMENT: 1.  Normal/positive high risk HPV on Pap smear 2.  Inadequate colposcopy-SCJ not visualized 3.  Acetowhite epithelium identified at 10:00  PLAN: 1.  Cervical biopsy at 10:00 2.  ECC 3.  Pap/HPV 16 and 18  4.  Repeat colposcopy in 6 months  Brayton Mars, MD  Note: This dictation was prepared with Dragon dictation along with smaller phrase technology. Any transcriptional errors that result from this process are unintentional.

## 2016-11-10 LAB — PATHOLOGY

## 2016-11-15 LAB — IGP, COBASHPV16/18
HPV 16: NEGATIVE
HPV 18: NEGATIVE
HPV other hr types: POSITIVE — AB
PAP SMEAR COMMENT: 0

## 2016-12-12 IMAGING — MG MM DIGITAL DIAGNOSTIC BILAT W/ TOMO W/ CAD
8 of 12 series · 8 of 28 positions shown · non-contrast
Comparison: Previous exams including right breast ultrasound dated
10/07/2014 and ultrasound-guided biopsy dated 03/31/2014 and.

CLINICAL DATA: Short-term follow-up for probably benign complicated
cyst within the right breast.

[R CC synth-2D]
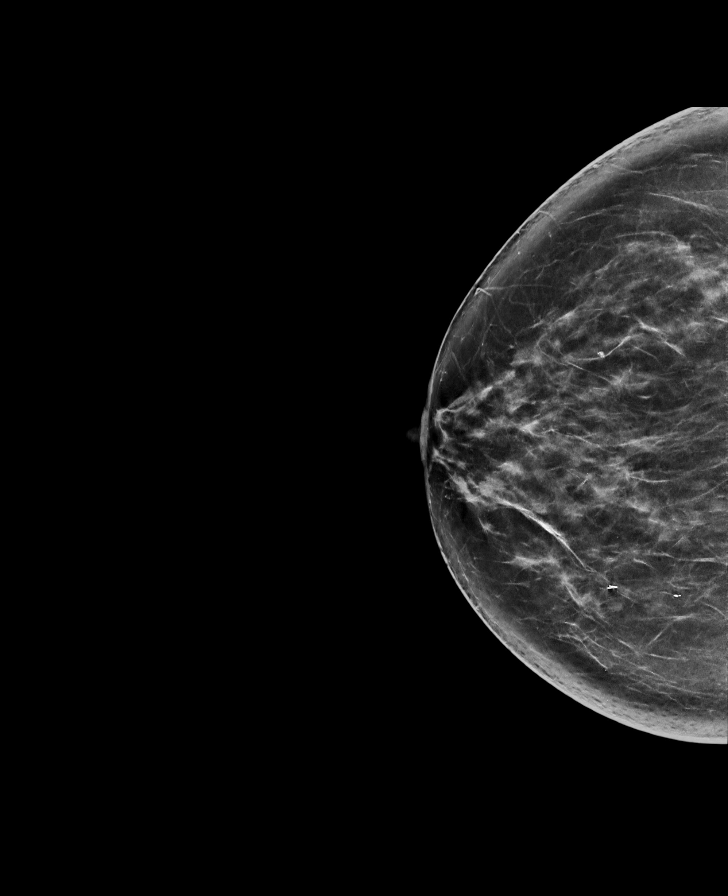

[R MLO]
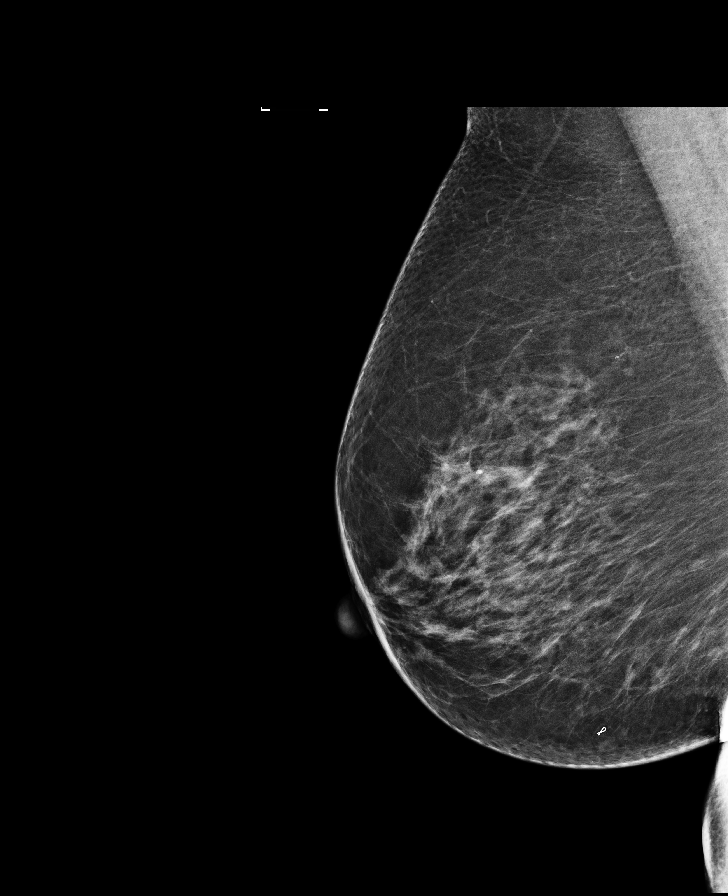

[L CC]
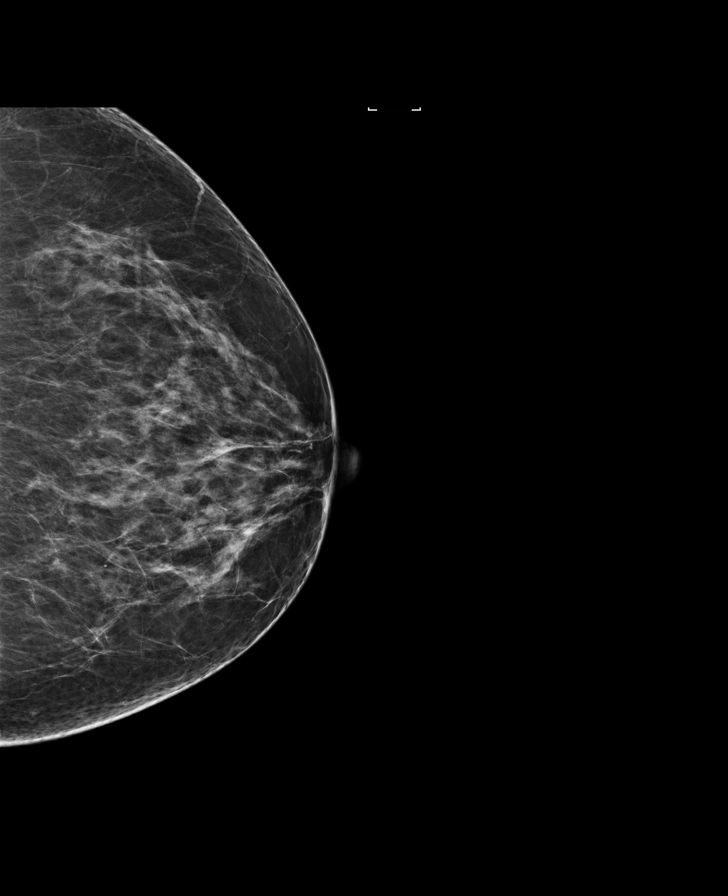

[L MLO synth-2D]
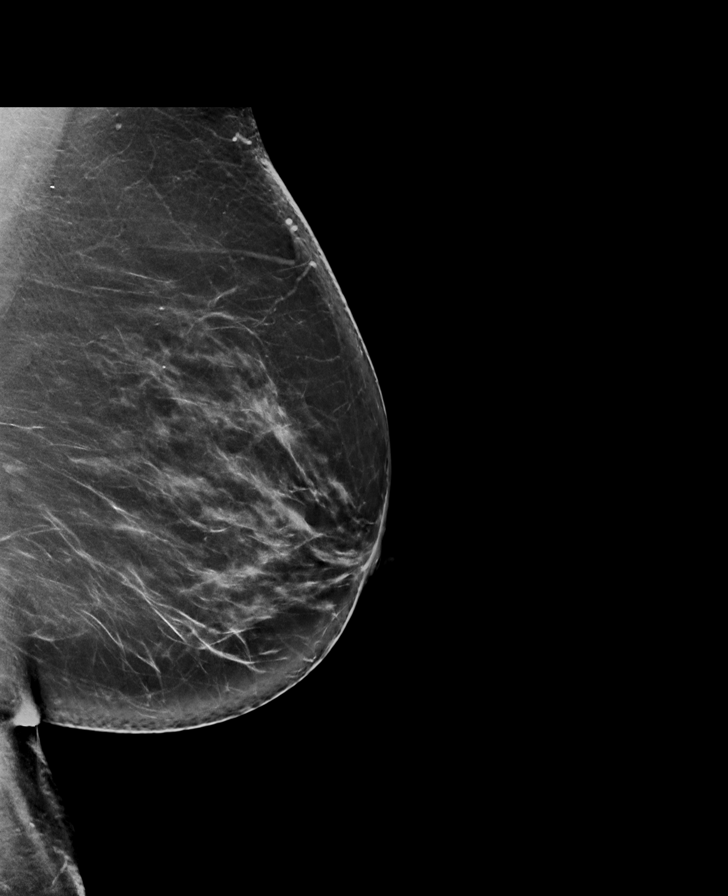

[R MLO synth-2D]
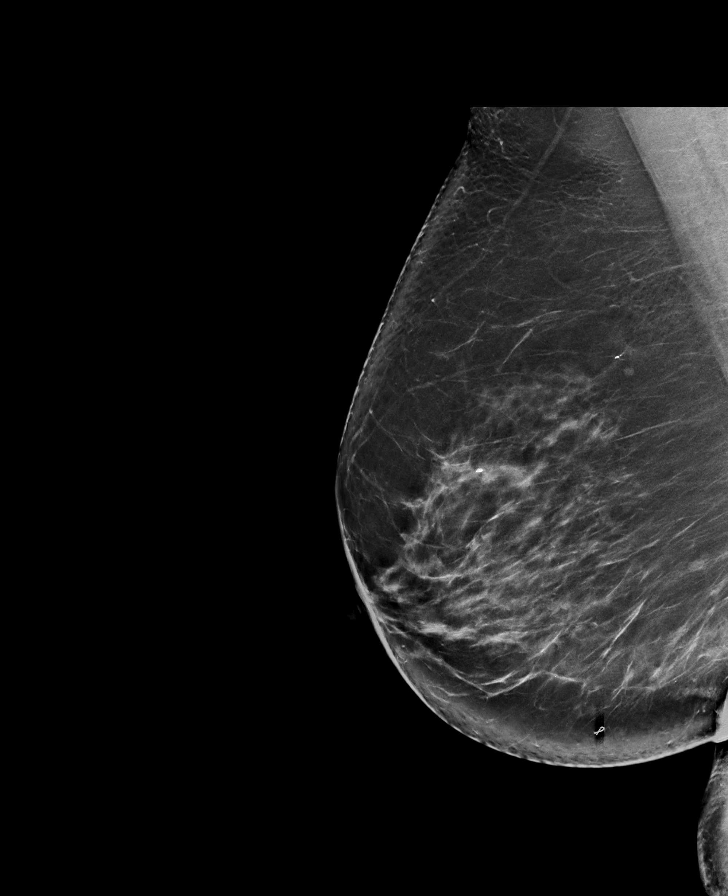

[R CC]
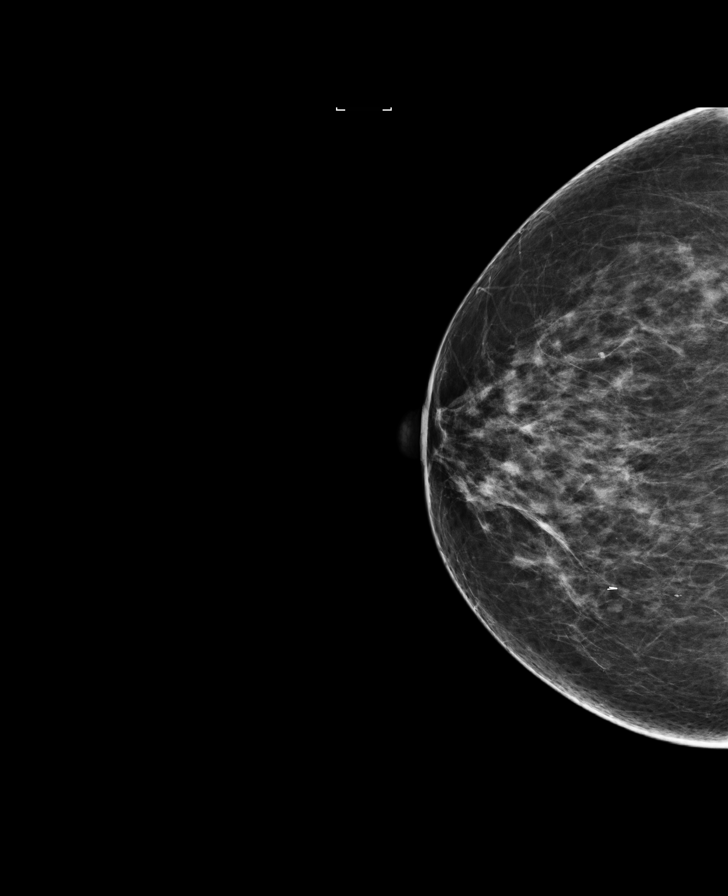

[L CC synth-2D]
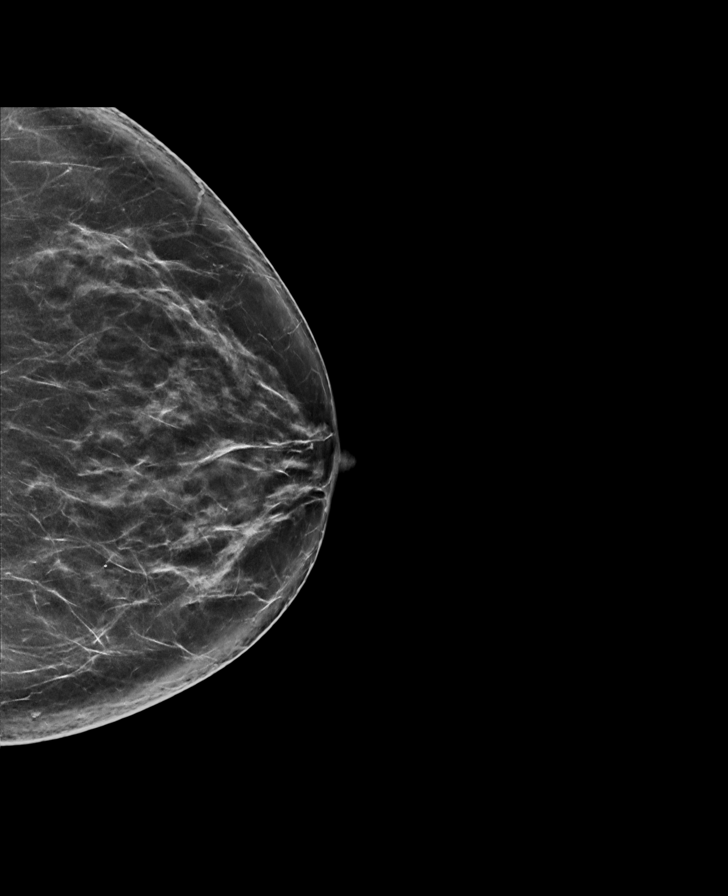

[L MLO]
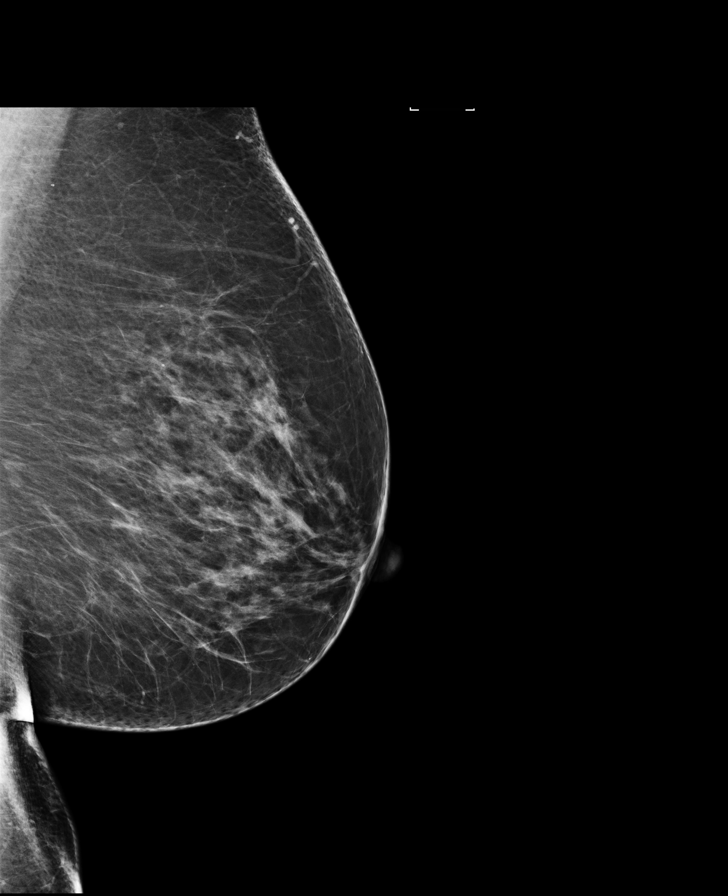

[8 of 28 positions shown; findings below may reference images not displayed]

Ultrasound-guided biopsy was performed of a right breast mass on
03/31/2014 revealing apocrine cyst and apocrine metaplasia. A
six-month follow-up was recommended at that time for a second
smaller nearby lesion. The six-month follow-up showed a slight
interval decrease in the size of this second nearby lesion
suggesting benign complicated cyst. Patient presents today for
additional follow-up.

EXAM:
2D DIGITAL DIAGNOSTIC BILATERAL MAMMOGRAM WITH CAD AND ADJUNCT TOMO
ACR Breast Density Category b: There are scattered areas of
fibroglandular density.
FINDINGS: Bilateral 2D CC and MLO projections were obtained, with additional
3D tomosynthesis. Biopsy site marker within the inner right breast
is stable in position. The adjacent oval circumscribed masses are
decreased in size compared to previous exams indicating benignity,
concordant with the decrease in size of the complicated cyst
demonstrated on most recent ultrasound of 10/07/2014.

There are no new dominant masses, suspicious calcifications or
secondary signs of malignancy within either breast.

Mammographic images were processed with CAD.
IMPRESSION: No evidence of malignancy within either breast.

Patient may return to routine annual screening mammogram schedule.

RECOMMENDATION:
Screening mammogram in one year.(Code:[id number])

I have discussed the findings and recommendations with the patient.
Results were also provided in writing at the conclusion of the
visit. If applicable, a reminder letter will be sent to the patient
regarding the next appointment.

BI-RADS CATEGORY  2: Benign.

## 2017-01-11 ENCOUNTER — Other Ambulatory Visit: Payer: Self-pay | Admitting: Obstetrics and Gynecology

## 2017-01-11 ENCOUNTER — Telehealth: Payer: Self-pay | Admitting: *Deleted

## 2017-01-11 DIAGNOSIS — M79602 Pain in left arm: Secondary | ICD-10-CM

## 2017-01-11 NOTE — Telephone Encounter (Signed)
Pt would like referral to Rebecca Rogers

## 2017-01-27 ENCOUNTER — Other Ambulatory Visit: Payer: Self-pay | Admitting: *Deleted

## 2017-01-27 MED ORDER — AZITHROMYCIN 250 MG PO TABS: 250.0000 mg | ORAL_TABLET | Freq: Every day | ORAL | 0 refills | Status: DC

## 2017-02-05 NOTE — Progress Notes (Signed)
[name] [address], [place] [address] Phone: [phone] Subjective:     CC: Right arm pain  POE:[id number]  Rebecca Rogers is a 62 y.o. female coming in with complaint of right arm pain. States her hand and fingers get numb. Started in the elbow.   Onset- Chronic Location mostly right arm.  Duration- Used to be worse at night Character- Achy Aggravating factors-  Reliving factors- 800 mg Ibuprofen Therapies tried-ibuprofen which does help but seems to be hurting her stomach. Severity-7 out of 10     Past Medical History:  Diagnosis Date  . Adenomyosis   . Anxiety   . Cyst of right breast    4-5 oclock 1/2 cm  . Hypertension    bordeline  . Increased BMI    Past Surgical History:  Procedure Laterality Date  . ABDOMINAL HYSTERECTOMY     lsh- adenomysis and fibroids  . BREAST BIOPSY Right 2016   NEG  . COLONOSCOPY WITH PROPOFOL N/A 06/05/2015   Procedure: COLONOSCOPY WITH PROPOFOL;  Surgeon: Lucilla Lame, MD;  Location: [place];  Service: Endoscopy;  Laterality: N/A;  . COLONOSCOPY WITH PROPOFOL N/A 12/04/2015   Procedure: COLONOSCOPY WITH PROPOFOL;  Surgeon: Lucilla Lame, MD;  Location: [place];  Service: Endoscopy;  Laterality: N/A;  . LAPAROSCOPIC SALPINGOOPHERECTOMY     prophylactic  . POLYPECTOMY  06/05/2015   Procedure: POLYPECTOMY;  Surgeon: Lucilla Lame, MD;  Location: [place];  Service: Endoscopy;;  . POLYPECTOMY  12/04/2015   Procedure: POLYPECTOMY;  Surgeon: Lucilla Lame, MD;  Location: [place];  Service: Endoscopy;;  . TONSILLECTOMY  1965   Social History   Socioeconomic History  . Marital status: Married    Spouse name: None  . Number of children: None  . Years of education: None  . Highest education level: None  Social Needs  . Financial resource strain: None  . Food insecurity - worry: None  . Food insecurity - inability: None  . Transportation needs -  medical: None  . Transportation needs - non-medical: None  Occupational History  . None  Tobacco Use  . Smoking status: Never Smoker  . Smokeless tobacco: Never Used  Substance and Sexual Activity  . Alcohol use: No  . Drug use: No  . Sexual activity: Not Currently  Other Topics Concern  . None  Social History Narrative  . None   No Known Allergies Family History  Problem Relation Age of Onset  . Ovarian cancer Mother   . Prostate cancer Father   . Breast cancer Neg Hx   . Colon cancer Neg Hx   . Diabetes Neg Hx   . Heart disease Neg Hx      Past medical history, social, surgical and family history all reviewed in electronic medical record.  No pertanent information unless stated regarding to the chief complaint.   Review of Systems:Review of systems updated and as accurate as of 02/06/17  No headache, visual changes, nausea, vomiting, diarrhea, constipation, dizziness, abdominal pain, skin rash, fevers, chills, night sweats, weight loss, swollen lymph nodes, body aches, joint swelling, muscle aches, chest pain, shortness of breath, mood changes.   Objective  Blood pressure 130/80, pulse 69, height 5\' 6"  (1.676 m), weight 193 lb (87.5 kg), SpO2 95 %. Systems examined below as of 02/06/17   General: No apparent distress alert and oriented x3 mood and affect normal, dressed appropriately.  HEENT: Pupils equal, extraocular movements intact  Respiratory: Patient's speak in full sentences and does not appear short of breath  Cardiovascular: No lower extremity edema, non tender, no erythema  Skin: Warm dry intact with no signs of infection or rash on extremities or on axial skeleton.  Abdomen: Soft nontender  Neuro: Cranial nerves II through XII are intact, neurovascularly intact in all extremities with 2+ DTRs and 2+ pulses.  Lymph: No lymphadenopathy of posterior or anterior cervical chain or axillae bilaterally.  Gait normal with good balance and coordination.  MSK:  Non  tender with full range of motion and good stability and symmetric strength and tone of shoulders, elbows, wrist, hip, knee and ankles bilaterally.  Neck: Inspection loss of lordosis. No palpable stepoffs.  positive Spurling's maneuver. Limited range of motion lacking 5 degrees of rotation to the right and 10 degrees of sidebending to the right Grip strength and sensation normal in bilateral hands Strength good C4 to T1 distribution No sensory change to C4 to T1 Negative Hoffman sign bilaterally Reflexes normal  [id number]; 15 additional minutes spent for Therapeutic exercises as stated in above notes.  This included exercises focusing on stretching, strengthening, with significant focus on eccentric aspects.   Long term goals include an improvement in range of motion, strength, endurance as well as avoiding reinjury. Patient's frequency would include in 1-2 times a day, 3-5 times a week for a duration of 6-12 weeks. Exercises that included:  Basic scapular stabilization to include adduction and depression of scapula Scaption, focusing on proper movement and good control Internal and External rotation utilizing a theraband, with elbow tucked at side entire time Rows with theraband which was given   Proper technique shown and discussed handout in great detail with ATC.  All questions were discussed and answered.     Impression and Recommendations:     This case required medical decision making of moderate complexity.      Note: This dictation was prepared with Dragon dictation along with smaller phrase technology. Any transcriptional errors that result from this process are unintentional.

## 2017-02-06 ENCOUNTER — Ambulatory Visit: Payer: Self-pay

## 2017-02-06 ENCOUNTER — Ambulatory Visit (INDEPENDENT_AMBULATORY_CARE_PROVIDER_SITE_OTHER): Payer: 59 | Admitting: Family Medicine

## 2017-02-06 ENCOUNTER — Ambulatory Visit (INDEPENDENT_AMBULATORY_CARE_PROVIDER_SITE_OTHER)
Admission: RE | Admit: 2017-02-06 | Discharge: 2017-02-06 | Disposition: A | Discharge status: Home / Self Care | Payer: 59 | Source: Ambulatory Visit | Attending: Family Medicine | Admitting: Family Medicine

## 2017-02-06 ENCOUNTER — Encounter: Payer: Self-pay | Admitting: Family Medicine

## 2017-02-06 ENCOUNTER — Other Ambulatory Visit: Payer: Self-pay

## 2017-02-06 VITALS — BP 130/80 | HR 69 | Ht 66.0 in | Wt 193.0 lb

## 2017-02-06 DIAGNOSIS — M5412 Radiculopathy, cervical region: Secondary | ICD-10-CM | POA: Diagnosis not present

## 2017-02-06 DIAGNOSIS — M79601 Pain in right arm: Secondary | ICD-10-CM

## 2017-02-06 DIAGNOSIS — M542 Cervicalgia: Secondary | ICD-10-CM | POA: Diagnosis not present

## 2017-02-06 MED ORDER — DICLOFENAC SODIUM 2 % TD SOLN: 2.0000 g | Freq: Two times a day (BID) | TRANSDERMAL | 3 refills | Status: DC

## 2017-02-06 MED ORDER — GABAPENTIN 100 MG PO CAPS: 200.0000 mg | ORAL_CAPSULE | Freq: Every day | ORAL | 3 refills | Status: DC

## 2017-02-06 NOTE — Patient Instructions (Addendum)
Good to see you  Ice is your friend Ice 20 minutes 2 times daily. Usually after activity and before bed. Exercises 3 times a week.  pennsaid pinkie amount topically 2 times daily as needed.  Gabapentin 200mg  at night Xray downstairs today  Will try to get you a better desk  See me again in 4 weeks.

## 2017-02-06 NOTE — Assessment & Plan Note (Signed)
Patient signs and symptoms is more consistent with a cervical radiculitis.  No signs difficulty with any of the joints at this time.  Full range of motion.  We discussed gabapentin, short course of prednisone, topical anti-inflammatories, home exercises.  X-rays ordered today to further evaluate for the amount of arthritis.  Follow-up with me again in 4 weeks

## 2017-03-05 NOTE — Progress Notes (Signed)
[facility] [address], [place] [address] Phone: [phone] Subjective:      CC: Neck pain follow-up  Rebecca Rogers  ERIE SICA is a 62 y.o. female coming in with complaint of neck pain.  Patient was found to have more of a cervical radiculitis.  Started on gabapentin.  We discussed home exercises and icing regimen.  Patient states 85-90% better.  No longer having any radicular symptoms.  Feeling much better.   Patient does have a new problem.  Right hip pain.  Notices she is losing range of motion.  Rotates her legs she has sharp pain in the groin area.  Denies though any weakness.  States that she can do most daily activities except for when she is trying to put on her shoes.  Patient did have x-rays taken February 06, 2017 that showed degenerative disc disease at C5 through C7.  This was independently visualized by me.    Past Medical History:  Diagnosis Date  . Adenomyosis   . Anxiety   . Cyst of right breast    4-5 oclock 1/2 cm  . Hypertension    bordeline  . Increased BMI    Past Surgical History:  Procedure Laterality Date  . ABDOMINAL HYSTERECTOMY     lsh- adenomysis and fibroids  . BREAST BIOPSY Right 2016   NEG  . COLONOSCOPY WITH PROPOFOL N/A 06/05/2015   Procedure: COLONOSCOPY WITH PROPOFOL;  Surgeon: Lucilla Lame, MD;  Location: [place] [facility];  Service: Endoscopy;  Laterality: N/A;  . COLONOSCOPY WITH PROPOFOL N/A 12/04/2015   Procedure: COLONOSCOPY WITH PROPOFOL;  Surgeon: Lucilla Lame, MD;  Location: [place];  Service: Endoscopy;  Laterality: N/A;  . LAPAROSCOPIC SALPINGOOPHERECTOMY     prophylactic  . POLYPECTOMY  06/05/2015   Procedure: POLYPECTOMY;  Surgeon: Lucilla Lame, MD;  Location: [place];  Service: Endoscopy;;  . POLYPECTOMY  12/04/2015   Procedure: POLYPECTOMY;  Surgeon: Lucilla Lame, MD;  Location: [place];  Service: Endoscopy;;  . TONSILLECTOMY  1965   Social  History   Socioeconomic History  . Marital status: Married    Spouse name: None  . Number of children: None  . Years of education: None  . Highest education level: None  Social Needs  . Financial resource strain: None  . Food insecurity - worry: None  . Food insecurity - inability: None  . Transportation needs - medical: None  . Transportation needs - non-medical: None  Occupational History  . None  Tobacco Use  . Smoking status: Never Smoker  . Smokeless tobacco: Never Used  Substance and Sexual Activity  . Alcohol use: No  . Drug use: No  . Sexual activity: Not Currently  Other Topics Concern  . None  Social History Narrative  . None   No Known Allergies Family History  Problem Relation Age of Onset  . Ovarian cancer Mother   . Prostate cancer Father   . Breast cancer Neg Hx   . Colon cancer Neg Hx   . Diabetes Neg Hx   . Heart disease Neg Hx      Past medical history, social, surgical and family history all reviewed in electronic medical record.  No pertanent information unless stated regarding to the chief complaint.   Review of Systems:Review of systems updated and as accurate as of 03/06/17  No headache, visual changes, nausea, vomiting, diarrhea, constipation, dizziness, abdominal pain, skin rash, fevers, chills, night sweats, weight loss, swollen  lymph nodes, body aches, joint swelling, muscle aches, chest pain, shortness of breath, mood changes.   Objective  Blood pressure 114/78, pulse 70, height 5\' 6"  (1.676 m), weight 193 lb (87.5 kg), SpO2 96 %. Systems examined below as of 03/06/17   General: No apparent distress alert and oriented x3 mood and affect normal, dressed appropriately.  HEENT: Pupils equal, extraocular movements intact  Respiratory: Patient's speak in full sentences and does not appear short of breath  Cardiovascular: No lower extremity edema, non tender, no erythema  Skin: Warm dry intact with no signs of infection or rash on extremities  or on axial skeleton.  Abdomen: Soft nontender  Neuro: Cranial nerves II through XII are intact, neurovascularly intact in all extremities with 2+ DTRs and 2+ pulses.  Lymph: No lymphadenopathy of posterior or anterior cervical chain or axillae bilaterally.  Gait normal with good balance and coordination.  MSK:  Non tender with full range of motion and good stability and symmetric strength and tone of shoulders, elbows, wrist, , knee and ankles bilaterally.  Neck: Inspection mild loss of lordosis. No palpable stepoffs. Negative Spurling's maneuver. Mild limitation in sidebending and rotation bilaterally. Grip strength and sensation normal in bilateral hands Strength good C4 to T1 distribution No sensory change to C4 to T1 Negative Hoffman sign bilaterally Reflexes normal  Tightness of the right trapezius.   Right hip exam shows the patient does have full internal range of motion.  Patient does lack the last 10 degrees of external range of motion.  Negative straight leg test.  Positive Faber test but more on the lateral aspect of the hip.  No pain over the pubic symphysis but patient does have pain with palpation over the groin itself. Contralateral hip unremarkable Impression and Recommendations:     This case required medical decision making of moderate complexity.      Note: This dictation was prepared with Dragon dictation along with smaller phrase technology. Any transcriptional errors that result from this process are unintentional.

## 2017-03-06 ENCOUNTER — Encounter: Payer: Self-pay | Admitting: Family Medicine

## 2017-03-06 ENCOUNTER — Ambulatory Visit (INDEPENDENT_AMBULATORY_CARE_PROVIDER_SITE_OTHER): Payer: 59 | Admitting: Family Medicine

## 2017-03-06 ENCOUNTER — Ambulatory Visit (INDEPENDENT_AMBULATORY_CARE_PROVIDER_SITE_OTHER)
Admission: RE | Admit: 2017-03-06 | Discharge: 2017-03-06 | Disposition: A | Discharge status: Home / Self Care | Payer: 59 | Source: Ambulatory Visit | Attending: Family Medicine | Admitting: Family Medicine

## 2017-03-06 VITALS — BP 114/78 | HR 70 | Ht 66.0 in | Wt 193.0 lb

## 2017-03-06 DIAGNOSIS — M25551 Pain in right hip: Secondary | ICD-10-CM

## 2017-03-06 DIAGNOSIS — M5412 Radiculopathy, cervical region: Secondary | ICD-10-CM | POA: Diagnosis not present

## 2017-03-06 NOTE — Patient Instructions (Signed)
Good to see you  Rebecca Rogers is your friend.  New exercises for the hip  Continue the gabapentin  pennsaid pinkie amount topically 2 times daily as needed.  See me again in 6 weeks

## 2017-03-06 NOTE — Assessment & Plan Note (Signed)
Patient's right hip pain that seem to be somewhat of a femoral acetabular impingement versus significant tightness of the hip abductors.  X-rays ordered today, topical anti-inflammatories given, discussed icing regimen and home exercises.  Discussed hip abductor strengthening.  X-rays pending.  Follow-up again in 4 weeks

## 2017-03-06 NOTE — Assessment & Plan Note (Signed)
Patient is responding very well to conservative therapy.  Encourage patient continue the same medications and exercises at this time follow-up again in 6 weeks

## 2017-04-07 ENCOUNTER — Encounter: Payer: Self-pay | Admitting: Obstetrics and Gynecology

## 2017-04-07 ENCOUNTER — Ambulatory Visit (INDEPENDENT_AMBULATORY_CARE_PROVIDER_SITE_OTHER): Payer: 59 | Admitting: Obstetrics and Gynecology

## 2017-04-07 VITALS — BP 143/77 | HR 72 | Ht 66.0 in | Wt 190.8 lb

## 2017-04-07 DIAGNOSIS — E669 Obesity, unspecified: Secondary | ICD-10-CM

## 2017-04-07 MED ORDER — CYANOCOBALAMIN 1000 MCG/ML IJ SOLN: 1000.0000 ug | INTRAMUSCULAR | 1 refills | Status: DC

## 2017-04-07 MED ORDER — PHENTERMINE HCL 37.5 MG PO TABS: 37.5000 mg | ORAL_TABLET | Freq: Every day | ORAL | 2 refills | Status: DC

## 2017-04-07 NOTE — Progress Notes (Signed)
Subjective:  DAVANNA HE is a 62 y.o. G0P0000 at Unknown being seen today for weight loss management- initial visit.  Patient reports General ROS: negative and reports previous weight loss attempts:    The following portions of the patient's history were reviewed and updated as appropriate: allergies, current medications, past family history, past medical history, past social history, past surgical history and problem list.   Objective:   Vitals:   04/07/17 1145  BP: (!) 143/77  Pulse: 72  Weight: 190 lb 12.8 oz (86.5 kg)  Height: 5\' 6"  (1.676 m)    General:  Alert, oriented and cooperative. Patient is in no acute distress.  :   :   :   :   :   :   PE: Well groomed female in no current distress,   Mental Status: Normal mood and affect. Normal behavior. Normal judgment and thought content.   Current BMI: Body mass index is 30.8 kg/m.   Assessment and Plan:  Obesity  There are no diagnoses linked to this encounter.  Plan: low carb, High protein diet RX for adipex 37.5 mg daily and B12 10108mcg.ml monthly, to start now with first injection given at today's visit. Reviewed side-effects common to both medications and expected outcomes. Increase daily water intake to at least 8 bottle a day, every day.  Goal is to reduse weight by 10% by end of three months, and will re-evaluate then.  RTC in 4 weeks for Nurse visit to check weight & BP, and get next B12 injections.    Please refer to After Visit Summary for other counseling recommendations.    [place], Katelynne Revak N, CNM   Detrell Umscheid [place], CNM      Consider the Low Glycemic Index Diet and 6 smaller meals daily .  This boosts your metabolism and regulates your sugars:   Use the protein bar by [employer]kins because they have lots of fiber in them  Find the low carb flatbreads, tortillas and pita breads for sandwiches:  Joseph's makes a pita bread and a flat bread , available at [facility] and [employer]; [place] makes a  low carb flatbread available at [employer] and [employer] that is 9 net carbs and 100 cal [place] makes a low carb whole wheat tortilla available at [employer] most grocery stores with 6 net carbs and 210 cal  [place] yogurt can still have a lot of carbs .  Dannon Light N fit has 80 cal and 8 carbs

## 2017-04-07 NOTE — Patient Instructions (Signed)

## 2017-04-10 NOTE — Progress Notes (Addendum)
Patient ID: Rebecca Rogers, female   DOB: 1955/06/22, 62 y.o.   MRN: [id number] ANNUAL PREVENTATIVE CARE GYN  ENCOUNTER NOTE  Subjective:       Rebecca Rogers is a 62 y.o. G0P0000 female here for a routine annual gynecologic exam.  Current complaints: 1.  Getting married next week; excited  62 year old white female para 0, menopausal, on no hormone replacement therapy, asymptomatic, with history of adenomyosis, status post LSH, with family history of ovarian cancer in mom, status post prophylactic BSO 9 years ago, presents for routine physical. Interval history: Status post core needle biopsies of right breast abnormality found on screening mammogram mammogram 2016, benign; status post interval mammogram October 2016-BI-RADS 3, 04/2015 birad 2 return to yearly screening History of increased BMI; weight stable; recently placed on phentermine therapy for weight loss by Lorelle Gibbs History of chronic peripheral edema, controlled with triamterene hydrochlorothiazide History of anxiety/depression, controlled on Zoloft 75 mg a day History of colon polyps; screening colonoscopy done 12/04/2015- polyp removed- neg- q  3 years repeat colonoscopy  Bowel function normal. Bladder function normal.     Gynecologic History No LMP recorded. Patient has had a hysterectomy. Contraception: status post hysterectomy; [place]; pathology-adenomyosis Family history ovarian cancer; status post laparoscopic BSO Last Pap: 11/08/2016 ascus/pos. 11/08/2016 colpo cin 1; next Pap smear due in May 2019 Last mammogram: 04/2016  birad 1  Obstetric History OB History  Gravida Para Term Preterm AB Living  0 0 0 0 0 0  SAB TAB Ectopic Multiple Live Births  0 0 0 0      Past Medical History:  Diagnosis Date  . Adenomyosis   . Anxiety   . Cyst of right breast    4-5 oclock 1/2 cm  . Hypertension    bordeline  . Increased BMI     Past Surgical History:  Procedure Laterality Date  . ABDOMINAL HYSTERECTOMY     lsh- adenomysis and fibroids  . BREAST BIOPSY Right 2016   NEG  . COLONOSCOPY WITH PROPOFOL N/A 06/05/2015   Procedure: COLONOSCOPY WITH PROPOFOL;  Surgeon: Lucilla Lame, MD;  Location: [facility];  Service: Endoscopy;  Laterality: N/A;  . COLONOSCOPY WITH PROPOFOL N/A 12/04/2015   Procedure: COLONOSCOPY WITH PROPOFOL;  Surgeon: Lucilla Lame, MD;  Location: [facility];  Service: Endoscopy;  Laterality: N/A;  . LAPAROSCOPIC SALPINGOOPHERECTOMY     prophylactic  . POLYPECTOMY  06/05/2015   Procedure: POLYPECTOMY;  Surgeon: Lucilla Lame, MD;  Location: [facility];  Service: Endoscopy;;  . POLYPECTOMY  12/04/2015   Procedure: POLYPECTOMY;  Surgeon: Lucilla Lame, MD;  Location: [facility];  Service: Endoscopy;;  . TONSILLECTOMY  1965    Current Outpatient Medications on File Prior to Visit  Medication Sig Dispense Refill  . azithromycin (ZITHROMAX) 250 MG tablet Take 1 tablet (250 mg total) by mouth daily. Take 2 tablets the first day (Patient not taking: Reported on 04/07/2017) 6 tablet 0  . cyanocobalamin (,VITAMIN B-12,) 1000 MCG/ML injection Inject 1 mL (1,000 mcg total) into the muscle every 30 (thirty) days. 10 mL 1  . Diclofenac Sodium (PENNSAID) 2 % SOLN Place 2 g onto the skin 2 (two) times daily. 112 g 3  . gabapentin (NEURONTIN) 100 MG capsule Take 2 capsules (200 mg total) by mouth at bedtime. 60 capsule 3  . ibuprofen (ADVIL,MOTRIN) 800 MG tablet Take 1 tablet (800 mg total) by mouth 3 (three) times daily. 50 tablet 1  . levothyroxine (SYNTHROID) 50 MCG tablet  Take 0.5 tablets (25 mcg total) by mouth daily before breakfast. 90 tablet 1  . phentermine (ADIPEX-P) 37.5 MG tablet Take 1 tablet (37.5 mg total) by mouth daily before breakfast. 30 tablet 2  . sertraline (ZOLOFT) 50 MG tablet Take 1.5 tablets (75 mg total) by mouth daily. 1 & 1/2 tab daily 135 tablet 4  . triamterene-hydrochlorothiazide (MAXZIDE-25) 37.5-25 MG tablet Take 1 tablet by mouth daily. 90  tablet 4   No current facility-administered medications on file prior to visit.     No Known Allergies  Social History   Socioeconomic History  . Marital status: Married    Spouse name: Not on file  . Number of children: Not on file  . Years of education: Not on file  . Highest education level: Not on file  Occupational History  . Not on file  Social Needs  . Financial resource strain: Not on file  . Food insecurity:    Worry: Not on file    Inability: Not on file  . Transportation needs:    Medical: Not on file    Non-medical: Not on file  Tobacco Use  . Smoking status: Never Smoker  . Smokeless tobacco: Never Used  Substance and Sexual Activity  . Alcohol use: No  . Drug use: No  . Sexual activity: Not Currently  Lifestyle  . Physical activity:    Days per week: Not on file    Minutes per session: Not on file  . Stress: Not on file  Relationships  . Social connections:    Talks on phone: Not on file    Gets together: Not on file    Attends religious service: Not on file    Active member of club or organization: Not on file    Attends meetings of clubs or organizations: Not on file    Relationship status: Not on file  . Intimate partner violence:    Fear of current or ex partner: Not on file    Emotionally abused: Not on file    Physically abused: Not on file    Forced sexual activity: Not on file  Other Topics Concern  . Not on file  Social History Narrative  . Not on file    Family History  Problem Relation Age of Onset  . Ovarian cancer Mother   . Prostate cancer Father   . Breast cancer Neg Hx   . Colon cancer Neg Hx   . Diabetes Neg Hx   . Heart disease Neg Hx     The following portions of the patient's history were reviewed and updated as appropriate: allergies, current medications, past family history, past medical history, past social history, past surgical history and problem list.  Review of Systems   Objective:   BP 115/77   Pulse  98   Ht 5\' 6"  (1.676 m)   Wt 186 lb 12.8 oz (84.7 kg)   BMI 30.15 kg/m  CONSTITUTIONAL: Well-developed, well-nourished female in no acute distress.  PSYCHIATRIC: Normal mood and affect. Normal behavior. Normal judgment and thought content. [place]: Alert and oriented to person, place, and time. Normal muscle tone coordination. No cranial nerve deficit noted. HENT:  Normocephalic, atraumatic, External right and left ear normal.  EYES: Conjunctivae and EOM are normal.  No scleral icterus.  NECK: Normal range of motion, supple, no masses.  Normal thyroid.  SKIN: Skin is warm and dry. No rash noted. Not diaphoretic. No erythema. No pallor. CARDIOVASCULAR: Normal heart rate noted, regular rhythm,  no murmur. RESPIRATORY: Clear to auscultation bilaterally. Effort and breath sounds normal, no problems with respiration noted. BREASTS: Symmetric in size. No masses, skin changes, nipple drainage, or lymphadenopathy. ABDOMEN: Soft, normal bowel sounds, no distention noted.  No tenderness, rebound or guarding.  BLADDER: Normal PELVIC:  External Genitalia: Normal  BUS: Normal  Vagina: Normal; thin white secretions; single digit exam performed  Cervix: Normal; no cervical motion tenderness  Uterus: Surgically absent  Adnexa: Surgically absent; no palpable masses  RV: External Exam NormaI, No Rectal Masses and Normal Sphincter tone  MUSCULOSKELETAL: Normal range of motion. No tenderness.  No cyanosis, clubbing, or edema.  2+ distal pulses. LYMPHATIC: No Axillary, Supraclavicular, or Inguinal Adenopathy.    Assessment:   Annual gynecologic examination 62 y.o. Contraception: status post hysterectomy LSH; status post laparoscopic BSO bmi-30, stable; now on phentermine therapy Surgical menopause, asymptomatic Peripheral edema, controlled with diuretic History of colon polyps,Status post colonoscopy 2017 Getting married in 1 week  Plan:  Pap: Due in May 2019 Mammogram: Ordered Stool Guaiac  Testing:decline Labs: renal panel, a1c, tsh fbs lipid Routine preventative health maintenance measures emphasized: Exercise/Diet/Weight control, Tobacco Warnings, Alcohol/Substance use risks and Stress Management Refill Zoloft Refill triamterene hydrochlorothiazide Return to [place], CMA  Brayton Mars, MD   Note: This dictation was prepared with Dragon dictation along with smaller phrase technology. Any transcriptional errors that result from this process are unintentional.

## 2017-04-11 ENCOUNTER — Other Ambulatory Visit: Payer: Self-pay | Admitting: *Deleted

## 2017-04-11 MED ORDER — AZITHROMYCIN 250 MG PO TABS: 250.0000 mg | ORAL_TABLET | Freq: Every day | ORAL | 0 refills | Status: DC

## 2017-04-13 ENCOUNTER — Encounter: Payer: Self-pay | Admitting: Obstetrics and Gynecology

## 2017-04-13 ENCOUNTER — Ambulatory Visit (INDEPENDENT_AMBULATORY_CARE_PROVIDER_SITE_OTHER): Payer: 59 | Admitting: Obstetrics and Gynecology

## 2017-04-13 VITALS — BP 115/77 | HR 98 | Ht 66.0 in | Wt 186.8 lb

## 2017-04-13 DIAGNOSIS — Z90722 Acquired absence of ovaries, bilateral: Secondary | ICD-10-CM

## 2017-04-13 DIAGNOSIS — I10 Essential (primary) hypertension: Secondary | ICD-10-CM | POA: Diagnosis not present

## 2017-04-13 DIAGNOSIS — Z1231 Encounter for screening mammogram for malignant neoplasm of breast: Secondary | ICD-10-CM | POA: Diagnosis not present

## 2017-04-13 DIAGNOSIS — Z01419 Encounter for gynecological examination (general) (routine) without abnormal findings: Secondary | ICD-10-CM | POA: Diagnosis not present

## 2017-04-13 DIAGNOSIS — R7989 Other specified abnormal findings of blood chemistry: Secondary | ICD-10-CM

## 2017-04-13 DIAGNOSIS — B977 Papillomavirus as the cause of diseases classified elsewhere: Secondary | ICD-10-CM

## 2017-04-13 DIAGNOSIS — Z8041 Family history of malignant neoplasm of ovary: Secondary | ICD-10-CM | POA: Diagnosis not present

## 2017-04-13 DIAGNOSIS — Z9079 Acquired absence of other genital organ(s): Secondary | ICD-10-CM

## 2017-04-13 DIAGNOSIS — Z90711 Acquired absence of uterus with remaining cervical stump: Secondary | ICD-10-CM | POA: Diagnosis not present

## 2017-04-13 DIAGNOSIS — R638 Other symptoms and signs concerning food and fluid intake: Secondary | ICD-10-CM

## 2017-04-13 DIAGNOSIS — F419 Anxiety disorder, unspecified: Secondary | ICD-10-CM | POA: Diagnosis not present

## 2017-04-13 DIAGNOSIS — Z1239 Encounter for other screening for malignant neoplasm of breast: Secondary | ICD-10-CM

## 2017-04-13 MED ORDER — LEVOTHYROXINE SODIUM 50 MCG PO TABS: 25.0000 ug | ORAL_TABLET | Freq: Every day | ORAL | 1 refills | Status: DC

## 2017-04-13 MED ORDER — SERTRALINE HCL 50 MG PO TABS: 75.0000 mg | ORAL_TABLET | Freq: Every day | ORAL | 4 refills | Status: DC

## 2017-04-13 MED ORDER — TRIAMTERENE-HCTZ 37.5-25 MG PO TABS: 1.0000 | ORAL_TABLET | Freq: Every day | ORAL | 4 refills | Status: DC

## 2017-04-13 NOTE — Patient Instructions (Addendum)
1.  No Pap smear is done today. 2.  Mammogram is ordered 3.  Stool guaiac cards are given for colon cancer screening 4.  Screening labs are ordered 5.  Medications are refilled including Maxzide sertraline levothyroxine 6.  Continue with healthy eating exercise and control weight loss 7.  Return in 1 year for annual exam 8.  Return in May 2019 for repeat Pap smear  History thank you thanks Health Maintenance for Postmenopausal Women Menopause is a normal process in which your reproductive ability comes to an end. This process happens gradually over a span of months to years, usually between the ages of 41 and 46. Menopause is complete when you have missed 12 consecutive menstrual periods. It is important to talk with your health care provider about some of the most common conditions that affect postmenopausal women, such as heart disease, cancer, and bone loss (osteoporosis). Adopting a healthy lifestyle and getting preventive care can help to promote your health and wellness. Those actions can also lower your chances of developing some of these common conditions. What should I know about menopause? During menopause, you may experience a number of symptoms, such as:  Moderate-to-severe hot flashes.  Night sweats.  Decrease in sex drive.  Mood swings.  Headaches.  Tiredness.  Irritability.  Memory problems.  Insomnia.  Choosing to treat or not to treat menopausal changes is an individual decision that you make with your health care provider. What should I know about hormone replacement therapy and supplements? Hormone therapy products are effective for treating symptoms that are associated with menopause, such as hot flashes and night sweats. Hormone replacement carries certain risks, especially as you become older. If you are thinking about using estrogen or estrogen with progestin treatments, discuss the benefits and risks with your health care provider. What should I know about  heart disease and stroke? Heart disease, heart attack, and stroke become more likely as you age. This may be due, in part, to the hormonal changes that your body experiences during menopause. These can affect how your body processes dietary fats, triglycerides, and cholesterol. Heart attack and stroke are both medical emergencies. There are many things that you can do to help prevent heart disease and stroke:  Have your blood pressure checked at least every 1-2 years. High blood pressure causes heart disease and increases the risk of stroke.  If you are 52-61 years old, ask your health care provider if you should take aspirin to prevent a heart attack or a stroke.  Do not use any tobacco products, including cigarettes, chewing tobacco, or electronic cigarettes. If you need help quitting, ask your health care provider.  It is important to eat a healthy diet and maintain a healthy weight. ? Be sure to include plenty of vegetables, fruits, low-fat dairy products, and lean protein. ? Avoid eating foods that are high in solid fats, added sugars, or salt (sodium).  Get regular exercise. This is one of the most important things that you can do for your health. ? Try to exercise for at least 150 minutes each week. The type of exercise that you do should increase your heart rate and make you sweat. This is known as moderate-intensity exercise. ? Try to do strengthening exercises at least twice each week. Do these in addition to the moderate-intensity exercise.  Know your numbers.Ask your health care provider to check your cholesterol and your blood glucose. Continue to have your blood tested as directed by your health care provider.  What should I know about cancer screening? There are several types of cancer. Take the following steps to reduce your risk and to catch any cancer development as early as possible. Breast Cancer  Practice breast self-awareness. ? This means understanding how your  breasts normally appear and feel. ? It also means doing regular breast self-exams. Let your health care provider know about any changes, no matter how small.  If you are 98 or older, have a clinician do a breast exam (clinical breast exam or CBE) every year. Depending on your age, family history, and medical history, it may be recommended that you also have a yearly breast X-ray (mammogram).  If you have a family history of breast cancer, talk with your health care provider about genetic screening.  If you are at high risk for breast cancer, talk with your health care provider about having an MRI and a mammogram every year.  Breast cancer (BRCA) gene test is recommended for women who have family members with BRCA-related cancers. Results of the assessment will determine the need for genetic counseling and BRCA1 and for BRCA2 testing. BRCA-related cancers include these types: ? Breast. This occurs in males or females. ? Ovarian. ? Tubal. This may also be called fallopian tube cancer. ? Cancer of the abdominal or pelvic lining (peritoneal cancer). ? Prostate. ? Pancreatic.  Cervical, Uterine, and Ovarian Cancer Your health care provider may recommend that you be screened regularly for cancer of the pelvic organs. These include your ovaries, uterus, and vagina. This screening involves a pelvic exam, which includes checking for microscopic changes to the surface of your cervix (Pap test).  For women ages 21-65, health care providers may recommend a pelvic exam and a Pap test every three years. For women ages 52-65, they may recommend the Pap test and pelvic exam, combined with testing for human papilloma virus (HPV), every five years. Some types of HPV increase your risk of cervical cancer. Testing for HPV may also be done on women of any age who have unclear Pap test results.  Other health care providers may not recommend any screening for nonpregnant women who are considered low risk for pelvic  cancer and have no symptoms. Ask your health care provider if a screening pelvic exam is right for you.  If you have had past treatment for cervical cancer or a condition that could lead to cancer, you need Pap tests and screening for cancer for at least 20 years after your treatment. If Pap tests have been discontinued for you, your risk factors (such as having a new sexual partner) need to be reassessed to determine if you should start having screenings again. Some women have medical problems that increase the chance of getting cervical cancer. In these cases, your health care provider may recommend that you have screening and Pap tests more often.  If you have a family history of uterine cancer or ovarian cancer, talk with your health care provider about genetic screening.  If you have vaginal bleeding after reaching menopause, tell your health care provider.  There are currently no reliable tests available to screen for ovarian cancer.  Lung Cancer Lung cancer screening is recommended for adults 32-56 years old who are at high risk for lung cancer because of a history of smoking. A yearly low-dose CT scan of the lungs is recommended if you:  Currently smoke.  Have a history of at least 30 pack-years of smoking and you currently smoke or have quit within the past  15 years. A pack-year is smoking an average of one pack of cigarettes per day for one year.  Yearly screening should:  Continue until it has been 15 years since you quit.  Stop if you develop a health problem that would prevent you from having lung cancer treatment.  Colorectal Cancer  This type of cancer can be detected and can often be prevented.  Routine colorectal cancer screening usually begins at age 9 and continues through age 91.  If you have risk factors for colon cancer, your health care provider may recommend that you be screened at an earlier age.  If you have a family history of colorectal cancer, talk with  your health care provider about genetic screening.  Your health care provider may also recommend using home test kits to check for hidden blood in your stool.  A small camera at the end of a tube can be used to examine your colon directly (sigmoidoscopy or colonoscopy). This is done to check for the earliest forms of colorectal cancer.  Direct examination of the colon should be repeated every 5-10 years until age 19. However, if early forms of precancerous polyps or small growths are found or if you have a family history or genetic risk for colorectal cancer, you may need to be screened more often.  Skin Cancer  Check your skin from head to toe regularly.  Monitor any moles. Be sure to tell your health care provider: ? About any new moles or changes in moles, especially if there is a change in a mole's shape or color. ? If you have a mole that is larger than the size of a pencil eraser.  If any of your family members has a history of skin cancer, especially at a young age, talk with your health care provider about genetic screening.  Always use sunscreen. Apply sunscreen liberally and repeatedly throughout the day.  Whenever you are outside, protect yourself by wearing long sleeves, pants, a wide-brimmed hat, and sunglasses.  What should I know about osteoporosis? Osteoporosis is a condition in which bone destruction happens more quickly than new bone creation. After menopause, you may be at an increased risk for osteoporosis. To help prevent osteoporosis or the bone fractures that can happen because of osteoporosis, the following is recommended:  If you are 66-54 years old, get at least 1,000 mg of calcium and at least 600 mg of vitamin D per day.  If you are older than age 59 but younger than age 81, get at least 1,200 mg of calcium and at least 600 mg of vitamin D per day.  If you are older than age 89, get at least 1,200 mg of calcium and at least 800 mg of vitamin D per  day.  Smoking and excessive alcohol intake increase the risk of osteoporosis. Eat foods that are rich in calcium and vitamin D, and do weight-bearing exercises several times each week as directed by your health care provider. What should I know about how menopause affects my mental health? Depression may occur at any age, but it is more common as you become older. Common symptoms of depression include:  Low or sad mood.  Changes in sleep patterns.  Changes in appetite or eating patterns.  Feeling an overall lack of motivation or enjoyment of activities that you previously enjoyed.  Frequent crying spells.  Talk with your health care provider if you think that you are experiencing depression. What should I know about immunizations? It is important  that you get and maintain your immunizations. These include:  Tetanus, diphtheria, and pertussis (Tdap) booster vaccine.  Influenza every year before the flu season begins.  Pneumonia vaccine.  Shingles vaccine.  Your health care provider may also recommend other immunizations. This information is not intended to replace advice given to you by your health care provider. Make sure you discuss any questions you have with your health care provider. Document Released: 02/11/2005 Document Revised: 07/10/2015 Document Reviewed: 09/23/2014 [employer] Interactive Patient Education  2018 [employer]  

## 2017-04-17 NOTE — Progress Notes (Signed)
[name] [address] [place], [place] [address] Phone: [phone] Subjective:    I'm seeing this patient by the request  of:    CC: Right hip pain follow-up  URK:[id number]  JANAIA KOZEL is a 62 y.o. female coming in with complaint of right hip pain.  Patient was found to have a greater trochanteric bursitis but did have significant decreasing range of motion.  Patient was sent for an x-ray.  Independently visualized by me showing no gross bony abnormality.      Past Medical History:  Diagnosis Date  . Adenomyosis   . Anxiety   . Cyst of right breast    4-5 oclock 1/2 cm  . Hypertension    bordeline  . Increased BMI    Past Surgical History:  Procedure Laterality Date  . ABDOMINAL HYSTERECTOMY     lsh- adenomysis and fibroids  . BREAST BIOPSY Right 2016   NEG  . COLONOSCOPY WITH PROPOFOL N/A 06/05/2015   Procedure: COLONOSCOPY WITH PROPOFOL;  Surgeon: Lucilla Lame, MD;  Location: [facility];  Service: Endoscopy;  Laterality: N/A;  . COLONOSCOPY WITH PROPOFOL N/A 12/04/2015   Procedure: COLONOSCOPY WITH PROPOFOL;  Surgeon: Lucilla Lame, MD;  Location: [facility];  Service: Endoscopy;  Laterality: N/A;  . LAPAROSCOPIC SALPINGOOPHERECTOMY     prophylactic  . POLYPECTOMY  06/05/2015   Procedure: POLYPECTOMY;  Surgeon: Lucilla Lame, MD;  Location: [facility];  Service: Endoscopy;;  . POLYPECTOMY  12/04/2015   Procedure: POLYPECTOMY;  Surgeon: Lucilla Lame, MD;  Location: [facility];  Service: Endoscopy;;  . TONSILLECTOMY  1965   Social History   Socioeconomic History  . Marital status: Divorced    Spouse name: Not on file  . Number of children: Not on file  . Years of education: Not on file  . Highest education level: Not on file  Occupational History  . Not on file  Social Needs  . Financial resource strain: Not on file  . Food insecurity:    Worry: Not on file    Inability: Not on file  .  Transportation needs:    Medical: Not on file    Non-medical: Not on file  Tobacco Use  . Smoking status: Never Smoker  . Smokeless tobacco: Never Used  Substance and Sexual Activity  . Alcohol use: No  . Drug use: No  . Sexual activity: Not Currently  Lifestyle  . Physical activity:    Days per week: 0 days    Minutes per session: 0 min  . Stress: Not on file  Relationships  . Social connections:    Talks on phone: Not on file    Gets together: Not on file    Attends religious service: Not on file    Active member of club or organization: Not on file    Attends meetings of clubs or organizations: Not on file    Relationship status: Not on file  Other Topics Concern  . Not on file  Social History Narrative  . Not on file   No Known Allergies Family History  Problem Relation Age of Onset  . Ovarian cancer Mother   . Prostate cancer Father   . Breast cancer Neg Hx   . Colon cancer Neg Hx   . Diabetes Neg Hx   . Heart disease Neg Hx      Past medical history, social, surgical and family history all reviewed in electronic medical record.  No  pertanent information unless stated regarding to the chief complaint.   Review of Systems:Review of systems updated and as accurate as of 04/18/17  No headache, visual changes, nausea, vomiting, diarrhea, constipation, dizziness, abdominal pain, skin rash, fevers, chills, night sweats, weight loss, swollen lymph nodes, body aches, joint swelling,  chest pain, shortness of breath, mood changes.  Positive muscle aches  Objective  Blood pressure 140/82, pulse 82, height 5\' 6"  (1.676 m), weight 187 lb (84.8 kg), SpO2 97 %. Systems examined below as of 04/18/17   General: No apparent distress alert and oriented x3 mood and affect normal, dressed appropriately.  HEENT: Pupils equal, extraocular movements intact  Respiratory: Patient's speak in full sentences and does not appear short of breath  Cardiovascular: No lower extremity edema,  non tender, no erythema  Skin: Warm dry intact with no signs of infection or rash on extremities or on axial skeleton.  Abdomen: Soft nontender  Neuro: Cranial nerves II through XII are intact, neurovascularly intact in all extremities with 2+ DTRs and 2+ pulses.  Lymph: No lymphadenopathy of posterior or anterior cervical chain or axillae bilaterally.  Gait normal with good balance and coordination.  MSK:  Non tender with full range of motion and good stability and symmetric strength and tone of shoulders, elbows, wrist,  knee and ankles bilaterally.  Right hip still shows decreased range of motion.  Patient still has difficulty with Corky Sox secondary to severe pain.  More pain  All over the greater trochanteric area.  Negative straight leg test.  Neurovascular intact distally with 5 out of 5 strength contralateral hip unremarkable  After verbal consent patient was prepped in sterile fashion with alcohol swabs. Ethyl chloride used patient was injected with a 22-gauge 3 inch needle into the RIGHT lateral hip in the greater trochanteric area under ultrasound guidance. Picture was taken. Patient had 4 cc of 0.5% Marcaine and 1 cc of Kenalog 40 mg/dL injected. Patient tolerated the procedure well and no blood loss. Pain completely resolved after injection stating proper placement. Post injection instructions given.   Impression and Recommendations:     This case required medical decision making of moderate complexity.      Note: This dictation was prepared with Dragon dictation along with smaller phrase technology. Any transcriptional errors that result from this process are unintentional.

## 2017-04-18 ENCOUNTER — Ambulatory Visit (INDEPENDENT_AMBULATORY_CARE_PROVIDER_SITE_OTHER): Payer: 59 | Admitting: Family Medicine

## 2017-04-18 ENCOUNTER — Encounter: Payer: Self-pay | Admitting: Family Medicine

## 2017-04-18 DIAGNOSIS — M7061 Trochanteric bursitis, right hip: Secondary | ICD-10-CM | POA: Diagnosis not present

## 2017-04-18 NOTE — Patient Instructions (Signed)
Good to see you  Ice 20 minutes 2 times daily. Usually after activity and before bed. Injected the GT today  We will hold on PT for now  Start the home exercises again after the honey moon See me again in 4 weeks

## 2017-04-18 NOTE — Assessment & Plan Note (Signed)
Given injection.  Tolerated procedure well.  Discussed icing regimen and home exercises.  Discussed which activities to do which wants to avoid.  Encourage patient to increase activity as tolerated.  Follow-up with me again in 4-6 weeks

## 2017-05-10 ENCOUNTER — Encounter: Payer: 59 | Admitting: Obstetrics and Gynecology

## 2017-05-15 NOTE — Progress Notes (Signed)
[name] [place], [place] [address] Phone: [phone] Subjective:    I'm seeing this patient by the request  of:    CC: Right hip pain follow-up  [id number]  Rebecca Rogers is a 62 y.o. female coming in with complaint of hip pain. She is doing better since last visit. She received an injection and feels that helped allevaite her pain.  Patient was found to have a greater trochanteric bursitis.  States that she is 85% better.  Doing the exercises occasionally.  Has not been icing regularly.  Feeling good overall.  Patient denies any radiation of the legs or any numbness or tingling.  Denies any associated back pain.     Past Medical History:  Diagnosis Date  . Adenomyosis   . Anxiety   . Cyst of right breast    4-5 oclock 1/2 cm  . Hypertension    bordeline  . Increased BMI    Past Surgical History:  Procedure Laterality Date  . ABDOMINAL HYSTERECTOMY     lsh- adenomysis and fibroids  . BREAST BIOPSY Right 2016   NEG  . COLONOSCOPY WITH PROPOFOL N/A 06/05/2015   Procedure: COLONOSCOPY WITH PROPOFOL;  Surgeon: Lucilla Lame, MD;  Location: [facility];  Service: Endoscopy;  Laterality: N/A;  . COLONOSCOPY WITH PROPOFOL N/A 12/04/2015   Procedure: COLONOSCOPY WITH PROPOFOL;  Surgeon: Lucilla Lame, MD;  Location: [facility];  Service: Endoscopy;  Laterality: N/A;  . LAPAROSCOPIC SALPINGOOPHERECTOMY     prophylactic  . POLYPECTOMY  06/05/2015   Procedure: POLYPECTOMY;  Surgeon: Lucilla Lame, MD;  Location: [facility];  Service: Endoscopy;;  . POLYPECTOMY  12/04/2015   Procedure: POLYPECTOMY;  Surgeon: Lucilla Lame, MD;  Location: [facility];  Service: Endoscopy;;  . TONSILLECTOMY  1965   Social History   Socioeconomic History  . Marital status: Divorced    Spouse name: Not on file  . Number of children: Not on file  . Years of education: Not on file  . Highest education level: Not on file    Occupational History  . Not on file  Social Needs  . Financial resource strain: Not on file  . Food insecurity:    Worry: Not on file    Inability: Not on file  . Transportation needs:    Medical: Not on file    Non-medical: Not on file  Tobacco Use  . Smoking status: Never Smoker  . Smokeless tobacco: Never Used  Substance and Sexual Activity  . Alcohol use: No  . Drug use: No  . Sexual activity: Not Currently  Lifestyle  . Physical activity:    Days per week: 0 days    Minutes per session: 0 min  . Stress: Not on file  Relationships  . Social connections:    Talks on phone: Not on file    Gets together: Not on file    Attends religious service: Not on file    Active member of club or organization: Not on file    Attends meetings of clubs or organizations: Not on file    Relationship status: Not on file  Other Topics Concern  . Not on file  Social History Narrative  . Not on file   No Known Allergies Family History  Problem Relation Age of Onset  . Ovarian cancer Mother   . Prostate cancer Father   . Breast cancer Neg Hx   . Colon cancer Neg Hx   .  Diabetes Neg Hx   . Heart disease Neg Hx      Past medical history, social, surgical and family history all reviewed in electronic medical record.  No pertanent information unless stated regarding to the chief complaint.   Review of Systems:Review of systems updated and as accurate as of 05/16/17  No headache, visual changes, nausea, vomiting, diarrhea, constipation, dizziness, abdominal pain, skin rash, fevers, chills, night sweats, weight loss, swollen lymph nodes, body aches, joint swelling, muscle aches, chest pain, shortness of breath, mood changes.   Objective  Blood pressure 112/68, pulse 78, height 5\' 6"  (1.676 m), weight 189 lb (85.7 kg), SpO2 98 %. Systems examined below as of 05/16/17   General: No apparent distress alert and oriented x3 mood and affect normal, dressed appropriately.  HEENT: Pupils  equal, extraocular movements intact  Respiratory: Patient's speak in full sentences and does not appear short of breath  Cardiovascular: No lower extremity edema, non tender, no erythema  Skin: Warm dry intact with no signs of infection or rash on extremities or on axial skeleton.  Abdomen: Soft nontender  Neuro: Cranial nerves II through XII are intact, neurovascularly intact in all extremities with 2+ DTRs and 2+ pulses.  Lymph: No lymphadenopathy of posterior or anterior cervical chain or axillae bilaterally.  Gait normal with good balance and coordination.  MSK:  Non tender with full range of motion and good stability and symmetric strength and tone of shoulders, elbows, wrist, , knee and ankles bilaterally.  Patient's right hip exam shows a very mild tenderness over the greater trochanteric area but otherwise full range of motion.  Negative straight leg test.  Mild tightness with Corky Sox test.  5 out of 5 strength.    Impression and Recommendations:          Note: This dictation was prepared with Dragon dictation along with smaller phrase technology. Any transcriptional errors that result from this process are unintentional.

## 2017-05-16 ENCOUNTER — Encounter: Payer: Self-pay | Admitting: Family Medicine

## 2017-05-16 ENCOUNTER — Ambulatory Visit (INDEPENDENT_AMBULATORY_CARE_PROVIDER_SITE_OTHER): Payer: 59 | Admitting: Family Medicine

## 2017-05-16 DIAGNOSIS — M7061 Trochanteric bursitis, right hip: Secondary | ICD-10-CM

## 2017-05-16 NOTE — Patient Instructions (Signed)
Good to see you  Ice is your friend maybe at night Continue the exercises 1-2 times a week  Stay active As long as you do well see me when you need me

## 2017-05-16 NOTE — Assessment & Plan Note (Signed)
Improved after the injection.  Discussed topical anti-inflammatories and icing regimen.  Taking the gabapentin at night for any of the cervical radiculopathy but is only having that intermittently.  Doing much better at this time.  Follow-up again as needed

## 2017-05-23 ENCOUNTER — Encounter: Payer: Self-pay | Admitting: Obstetrics and Gynecology

## 2017-05-23 ENCOUNTER — Ambulatory Visit (INDEPENDENT_AMBULATORY_CARE_PROVIDER_SITE_OTHER): Payer: 59 | Admitting: Obstetrics and Gynecology

## 2017-05-23 DIAGNOSIS — N870 Mild cervical dysplasia: Secondary | ICD-10-CM

## 2017-05-23 DIAGNOSIS — B977 Papillomavirus as the cause of diseases classified elsewhere: Secondary | ICD-10-CM | POA: Diagnosis not present

## 2017-05-23 NOTE — Patient Instructions (Signed)
1.  Colposcopy is normal today without evidence of disease 2.  Return in 6 months for Pap smear

## 2017-05-23 NOTE — Progress Notes (Signed)
Chief complaint: 1.  CIN-1 cervix 2.  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)  Non-smoker Monogamous Status post LSH No history of STI  Pap smear history: 05/23/2017 colposcopy without biopsies; adequate-normal-appearing cervix and SCJ is visualized 11/08/2016 colposcopy with colposcopic directed biopsies; inadequate; acetowhite epithelium at 10:00  Pap/HPV ASCUS/positive (not HPV 16, 9 HPV 18)  ECC-negative  Cervix 10:00-CIN-1 05/04/2016 colposcopy with colposcopic directed biopsies; Inadequate; acetowhite epithelium between 10 and 11:00  ECC-negative  Cervix 10:00-CIN-1 04/07/2016 Pap smear/HPV negative/positive  OBJECTIVE: BP 131/79   Pulse 90   Ht 5\' 6"  (1.676 m)   Wt 182 lb 9.6 oz (82.8 kg)   BMI 29.47 kg/m  Pleasant female no acute distress.  Alert and oriented. Pelvic exam: External genitalia-normal BUS-normal Vagina-normal Cervix-no gross lesions; nulliparous Uterus-surgically absent Adnexa-not examined Rectovaginal-normal external exam  PROCEDURE: Colposcopy of cervix and upper adjacent vagina without biopsies Findings: Normal-appearing cervix Biopsies: None Description: Verbal consent is obtained.  Patient is placed in dorsolithotomy position.  Peterson speculum was inserted to the vagina to facilitate visualization of the cervix and upper adjacent vagina.  The vagina and cervix are swabbed with acetic acid soaked Fox swabs.  Colposcopy is performed and demonstrates normal findings.  No biopsies are taken.  Procedure is well-tolerated.  ASSESSMENT: 1.  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  Normal colposcopy today; colposcopy is adequate  PLAN: 1.  Return in 6 months for Pap smear with colposcopy 2.  Return in 1 year for annual exam as scheduled  Brayton Mars, MD  Note: This dictation was prepared with Dragon dictation along with smaller phrase technology. Any transcriptional errors that result from this process  are unintentional.

## 2017-05-25 ENCOUNTER — Ambulatory Visit
Admission: RE | Admit: 2017-05-25 | Discharge: 2017-05-25 | Disposition: A | Discharge status: Home / Self Care | Payer: 59 | Source: Ambulatory Visit | Attending: Obstetrics and Gynecology | Admitting: Obstetrics and Gynecology

## 2017-05-25 DIAGNOSIS — Z1231 Encounter for screening mammogram for malignant neoplasm of breast: Secondary | ICD-10-CM | POA: Insufficient documentation

## 2017-05-25 DIAGNOSIS — Z1239 Encounter for other screening for malignant neoplasm of breast: Secondary | ICD-10-CM

## 2017-05-30 ENCOUNTER — Other Ambulatory Visit: Payer: Self-pay | Admitting: Obstetrics and Gynecology

## 2017-05-30 DIAGNOSIS — R928 Other abnormal and inconclusive findings on diagnostic imaging of breast: Secondary | ICD-10-CM

## 2017-05-30 DIAGNOSIS — N6310 Unspecified lump in the right breast, unspecified quadrant: Secondary | ICD-10-CM

## 2017-06-06 ENCOUNTER — Ambulatory Visit
Admission: RE | Admit: 2017-06-06 | Discharge: 2017-06-06 | Disposition: A | Discharge status: Home / Self Care | Payer: 59 | Source: Ambulatory Visit | Attending: Obstetrics and Gynecology | Admitting: Obstetrics and Gynecology

## 2017-06-06 DIAGNOSIS — N6310 Unspecified lump in the right breast, unspecified quadrant: Secondary | ICD-10-CM | POA: Diagnosis not present

## 2017-06-06 DIAGNOSIS — N6001 Solitary cyst of right breast: Secondary | ICD-10-CM | POA: Diagnosis not present

## 2017-06-06 DIAGNOSIS — R928 Other abnormal and inconclusive findings on diagnostic imaging of breast: Secondary | ICD-10-CM | POA: Insufficient documentation

## 2017-06-22 DIAGNOSIS — R7989 Other specified abnormal findings of blood chemistry: Secondary | ICD-10-CM | POA: Diagnosis not present

## 2017-06-22 DIAGNOSIS — R638 Other symptoms and signs concerning food and fluid intake: Secondary | ICD-10-CM | POA: Diagnosis not present

## 2017-06-22 DIAGNOSIS — B977 Papillomavirus as the cause of diseases classified elsewhere: Secondary | ICD-10-CM | POA: Diagnosis not present

## 2017-06-22 DIAGNOSIS — Z01419 Encounter for gynecological examination (general) (routine) without abnormal findings: Secondary | ICD-10-CM | POA: Diagnosis not present

## 2017-06-23 LAB — LIPID PANEL
Chol/HDL Ratio: 6 ratio — ABNORMAL HIGH (ref 0.0–4.4)
Cholesterol, Total: 198 mg/dL (ref 100–199)
HDL: 33 mg/dL — ABNORMAL LOW (ref >39)
LDL CALC: 127 mg/dL — AB (ref 0–99)
TRIGLYCERIDES: 189 mg/dL — AB (ref 0–149)
VLDL Cholesterol Cal: 38 mg/dL (ref 5–40)

## 2017-06-23 LAB — HEMOGLOBIN A1C
Est. average glucose Bld gHb Est-mCnc: 123 mg/dL
Hgb A1c MFr Bld: 5.9 % — ABNORMAL HIGH (ref 4.8–5.6)

## 2017-06-23 LAB — RENAL FUNCTION PANEL
Albumin: 4.5 g/dL (ref 3.5–5.5)
BUN/Creatinine Ratio: 15 (ref 9–23)
BUN: 13 mg/dL (ref 6–24)
CHLORIDE: 99 mmol/L (ref 96–106)
CO2: 26 mmol/L (ref 20–29)
Calcium: 9.6 mg/dL (ref 8.7–10.2)
Creatinine, Ser: 0.87 mg/dL (ref 0.57–1.00)
GFR, EST AFRICAN AMERICAN: 84 mL/min/{1.73_m2} (ref >59)
GFR, EST NON AFRICAN AMERICAN: 73 mL/min/{1.73_m2} (ref >59)
Glucose: 102 mg/dL — ABNORMAL HIGH (ref 65–99)
Phosphorus: 2.7 mg/dL (ref 2.5–4.5)
Potassium: 4.2 mmol/L (ref 3.5–5.2)
Sodium: 140 mmol/L (ref 134–144)

## 2017-06-23 LAB — TSH: TSH: 2.76 u[IU]/mL (ref 0.450–4.500)

## 2017-08-10 ENCOUNTER — Other Ambulatory Visit: Payer: Self-pay

## 2017-08-10 MED ORDER — NYSTATIN-TRIAMCINOLONE 100000-0.1 UNIT/GM-% EX CREA: 1.0000 "application " | TOPICAL_CREAM | Freq: Two times a day (BID) | CUTANEOUS | 1 refills | Status: DC

## 2017-08-15 ENCOUNTER — Encounter (INDEPENDENT_AMBULATORY_CARE_PROVIDER_SITE_OTHER): Payer: Self-pay | Admitting: Family Medicine

## 2017-08-15 ENCOUNTER — Ambulatory Visit (INDEPENDENT_AMBULATORY_CARE_PROVIDER_SITE_OTHER): Payer: 59 | Admitting: Family Medicine

## 2017-08-15 VITALS — BP 121/78 | HR 66 | Temp 97.9°F | Ht 66.0 in | Wt 180.0 lb

## 2017-08-15 DIAGNOSIS — R739 Hyperglycemia, unspecified: Secondary | ICD-10-CM | POA: Diagnosis not present

## 2017-08-15 DIAGNOSIS — Z6830 Body mass index (BMI) 30.0-30.9, adult: Secondary | ICD-10-CM | POA: Diagnosis not present

## 2017-08-15 DIAGNOSIS — R0602 Shortness of breath: Secondary | ICD-10-CM | POA: Insufficient documentation

## 2017-08-15 DIAGNOSIS — E669 Obesity, unspecified: Secondary | ICD-10-CM

## 2017-08-15 DIAGNOSIS — E038 Other specified hypothyroidism: Secondary | ICD-10-CM

## 2017-08-15 DIAGNOSIS — Z9189 Other specified personal risk factors, not elsewhere classified: Secondary | ICD-10-CM

## 2017-08-15 DIAGNOSIS — R5383 Other fatigue: Secondary | ICD-10-CM | POA: Insufficient documentation

## 2017-08-15 DIAGNOSIS — Z1331 Encounter for screening for depression: Secondary | ICD-10-CM

## 2017-08-15 DIAGNOSIS — Z0289 Encounter for other administrative examinations: Secondary | ICD-10-CM

## 2017-08-15 NOTE — Progress Notes (Signed)
.  Office: [phone]  /  Fax: [phone]   HPI:   Chief Complaint: OBESITY  Rebecca Rogers (MR# [id number]) is a 62 y.o. female who presents on 08/15/2017 for obesity evaluation and treatment. Current BMI is Body mass index is 29.05 kg/m.Marland Kitchen Obesity by fat percentage is >35%. Rebecca Rogers has struggled with obesity for years and has been unsuccessful in either losing weight or maintaining long term weight loss. Rebecca Rogers attended our information session and states she is currently in the action stage of change and ready to dedicate time achieving and maintaining a healthier weight.  Rebecca Rogers states her desired weight loss is 40 lbs she started gaining weight after age 48 her heaviest weight ever was 190 lbs. she has significant food cravings issues  she is frequently drinking liquids with calories   Fatigue Rebecca Rogers feels her energy is lower than it should be. This has worsened with weight gain and has not worsened recently. Rebecca Rogers admits to daytime somnolence and  denies waking up still tired. Patient is at risk for obstructive sleep apnea. Patent has a history of symptoms of daytime fatigue. Patient generally gets 7 or 8 hours of sleep per night, and states they generally have restful sleep. Snoring is present. Apneic episodes are not present. Epworth Sleepiness Score is 7  Dyspnea on exertion Rebecca Rogers notes increasing shortness of breath with exercising and seems to be worsening over time with weight gain. She notes getting out of breath sooner with activity than she used to. This has not gotten worse recently. Rebecca Rogers denies orthopnea.  Hypothyroid Rebecca Rogers has a diagnosis of hypothyroidism. She is on synthroid. She denies hot or cold intolerance or palpitations, but does admit to ongoing fatigue.  Hyperglycemia Rebecca Rogers has a diagnosis of hyperglycemia with elevated Hgb A1c in the past. She admits polyphagia.  At risk for diabetes Rebecca Rogers is at higher than average risk for developing diabetes due to  her obesity and hyperglycemia. She currently denies polyuria or polydipsia.  Depression Screen Rebecca Rogers's Food and Mood (modified PHQ-9) score was  Depression screen PHQ 2/9 08/15/2017  Decreased Interest 1  Down, Depressed, Hopeless 1  PHQ - 2 Score 2  Altered sleeping 0  Tired, decreased energy 3  Change in appetite 0  Feeling bad or failure about yourself  0  Trouble concentrating 0  Moving slowly or fidgety/restless 0  Suicidal thoughts 0  PHQ-9 Score 5  Difficult doing work/chores Not difficult at all    ALLERGIES: No Known Allergies  MEDICATIONS: Current Outpatient Medications on File Prior to Visit  Medication Sig Dispense Refill  . levothyroxine (SYNTHROID) 50 MCG tablet Take 0.5 tablets (25 mcg total) by mouth daily before breakfast. 90 tablet 1  . nystatin-triamcinolone (MYCOLOG II) cream Apply 1 application topically 2 (two) times daily. 30 g 1  . sertraline (ZOLOFT) 50 MG tablet Take 1.5 tablets (75 mg total) by mouth daily. 1 & 1/2 tab daily 135 tablet 4  . triamterene-hydrochlorothiazide (MAXZIDE-25) 37.5-25 MG tablet Take 1 tablet by mouth daily. 90 tablet 4   No current facility-administered medications on file prior to visit.     PAST MEDICAL HISTORY: Past Medical History:  Diagnosis Date  . Adenomyosis   . Anxiety   . Cyst of right breast    4-5 oclock 1/2 cm  . HLD (hyperlipidemia)   . Hypertension    bordeline  . Hypothyroidism   . Increased BMI     PAST SURGICAL HISTORY: Past Surgical History:  Procedure Laterality Date  .  ABDOMINAL HYSTERECTOMY     lsh- adenomysis and fibroids  . BREAST BIOPSY Right 2016   NEG  . COLONOSCOPY WITH PROPOFOL N/A 06/05/2015   Procedure: COLONOSCOPY WITH PROPOFOL;  Surgeon: Lucilla Lame, MD;  Location: [facility];  Service: Endoscopy;  Laterality: N/A;  . COLONOSCOPY WITH PROPOFOL N/A 12/04/2015   Procedure: COLONOSCOPY WITH PROPOFOL;  Surgeon: Lucilla Lame, MD;  Location: [place];  Service:  Endoscopy;  Laterality: N/A;  . LAPAROSCOPIC SALPINGOOPHERECTOMY     prophylactic  . POLYPECTOMY  06/05/2015   Procedure: POLYPECTOMY;  Surgeon: Lucilla Lame, MD;  Location: [place];  Service: Endoscopy;;  . POLYPECTOMY  12/04/2015   Procedure: POLYPECTOMY;  Surgeon: Lucilla Lame, MD;  Location: [place];  Service: Endoscopy;;  . TONSILLECTOMY  1965    SOCIAL HISTORY: Social History   Tobacco Use  . Smoking status: Never Smoker  . Smokeless tobacco: Never Used  Substance Use Topics  . Alcohol use: No  . Drug use: No    FAMILY HISTORY: Family History  Problem Relation Age of Onset  . Ovarian cancer Mother   . Prostate cancer Father   . Breast cancer Neg Hx   . Colon cancer Neg Hx   . Diabetes Neg Hx   . Heart disease Neg Hx     ROS: Review of Systems  Constitutional: Positive for malaise/fatigue.  Eyes:       Wear Glasses or Contacts  Respiratory: Positive for shortness of breath (on exertion).   Cardiovascular: Negative for palpitations and orthopnea.  Genitourinary: Negative for frequency.  Endo/Heme/Allergies: Negative for polydipsia.       Positive for hyperglycemia Negative for heat or cold intolerance Positive for polyphagia  Psychiatric/Behavioral:       Stress    PHYSICAL EXAM: Blood pressure 121/78, pulse 66, temperature 97.9 F (36.6 C), temperature source Oral, height 5\' 6"  (1.676 m), weight 180 lb (81.6 kg), SpO2 97 %. Body mass index is 29.05 kg/m. Physical Exam  Constitutional: She is oriented to person, place, and time. She appears well-developed and well-nourished.  HENT:  Head: Normocephalic and atraumatic.  Nose: Nose normal.  Eyes: EOM are normal. No scleral icterus.  Neck: Normal range of motion. Neck supple. No thyromegaly present.  Cardiovascular: Normal rate and regular rhythm.  Pulmonary/Chest: Effort normal. No respiratory distress.  Abdominal: Soft.  +obesity  Musculoskeletal: Normal range of motion.  Range of  Motion normal in all 4 extremities  Neurological: She is alert and oriented to person, place, and time. Coordination normal.  Skin: Skin is warm and dry.  Psychiatric: She has a normal mood and affect. Her behavior is normal.  Vitals reviewed.   RECENT LABS AND TESTS: BMET    Component Value Date/Time   NA 140 06/22/2017 0826   K 4.2 06/22/2017 0826   CL 99 06/22/2017 0826   CO2 26 06/22/2017 0826   GLUCOSE 102 (H) 06/22/2017 0826   BUN 13 06/22/2017 0826   CREATININE 0.87 06/22/2017 0826   CALCIUM 9.6 06/22/2017 0826   GFRNONAA 73 06/22/2017 0826   GFRAA 84 06/22/2017 0826   Lab Results  Component Value Date   HGBA1C 5.9 (H) 06/22/2017   No results found for: INSULIN CBC    Component Value Date/Time   WBC 7.2 06/03/2015 1045   RBC 4.92 06/03/2015 1045   HGB 14.8 06/03/2015 1045   HCT 42.5 06/03/2015 1045   PLT 365 06/03/2015 1045   MCV 86 06/03/2015 1045   MCH  30.1 06/03/2015 1045   MCHC 34.8 06/03/2015 1045   RDW 14.3 06/03/2015 1045   LYMPHSABS 1.5 06/03/2015 1045   EOSABS 0.3 06/03/2015 1045   BASOSABS 0.0 06/03/2015 1045   Iron/TIBC/Ferritin/ %Sat No results found for: IRON, TIBC, FERRITIN, IRONPCTSAT Lipid Panel     Component Value Date/Time   CHOL 198 06/22/2017 0826   TRIG 189 (H) 06/22/2017 0826   HDL 33 (L) 06/22/2017 0826   CHOLHDL 6.0 (H) 06/22/2017 0826   LDLCALC 127 (H) 06/22/2017 0826   Hepatic Function Panel     Component Value Date/Time   ALBUMIN 4.5 06/22/2017 0826      Component Value Date/Time   TSH 2.760 06/22/2017 0826   Vitamin D Results for TOLULOPE, PINKETT (MRN [id number]) as of 08/15/2017 11:08  Ref. Range 04/08/2016 08:24  Vitamin D, 25-Hydroxy Latest Ref Range: 30.0 - 100.0 ng/mL 23.8 (L)    ECG  shows NSR with a rate of 70 BPM INDIRECT CALORIMETER done today shows a VO2 of 239 and a REE of 1663. Her calculated basal metabolic rate is 3500 thus her basal metabolic rate is better than expected.    ASSESSMENT AND  PLAN: Other fatigue - Plan: EKG 12-Lead, CBC With Differential, Folate, Lipid Panel With LDL/HDL Ratio, VITAMIN D 25 Hydroxy (Vit-D Deficiency, Fractures), Vitamin B12  Shortness of breath on exertion  Other specified hypothyroidism - Plan: T3, T4, free, TSH  Hyperglycemia - Plan: Comprehensive metabolic panel, Hemoglobin A1c, Insulin, random  Depression screening  At risk for diabetes mellitus  Class 1 obesity with serious comorbidity and body mass index (BMI) of 30.0 to 30.9 in adult, unspecified obesity type  PLAN:  Fatigue Nakema was informed that her fatigue may be related to obesity, depression or many other causes. Labs will be ordered, and in the meanwhile Kennidy has agreed to work on diet, exercise and weight loss to help with fatigue. Proper sleep hygiene was discussed including the need for 7-8 hours of quality sleep each night. A sleep study was not ordered based on symptoms and Epworth score.  Dyspnea on exertion Yannely's shortness of breath appears to be obesity related and exercise induced. She has agreed to work on weight loss and gradually increase exercise to treat her exercise induced shortness of breath. If Keesha follows our instructions and loses weight without improvement of her shortness of breath, we will plan to refer to pulmonology. We will monitor this condition regularly. Maizie agrees to this plan.  Hypothyroid Ashlee was informed of the importance of good thyroid control to help with weight loss efforts. She was also informed that supertheraputic thyroid levels are dangerous and will not improve weight loss results. We will check labs and Gabrianna will continue synthroid.  Hyperglycemia We will recheck labs. Denna will start and we will follow.  Diabetes risk counseling Jenniger was given extended (15 minutes) diabetes prevention counseling today. She is 62 y.o. female and has risk factors for diabetes including obesity and hyperglycemia. We discussed  intensive lifestyle modifications today with an emphasis on weight loss as well as increasing exercise and decreasing simple carbohydrates in her diet.  Depression Screen Kenzee had a mildly positive depression screening. Depression is commonly associated with obesity and often results in emotional eating behaviors. We will monitor this closely and work on CBT to help improve the non-hunger eating patterns. Referral to Psychology may be required if no improvement is seen as she continues in our clinic.  Obesity Harlynn is currently in the action stage  of change and her goal is to continue with weight loss efforts She has agreed to follow the Category 2 plan and the Category 1 plan if needed Iriel has been instructed to work up to a goal of 150 minutes of combined cardio and strengthening exercise per week for weight loss and overall health benefits. We discussed the following Behavioral Modification Strategies today: increasing lean protein intake, decreasing simple carbohydrates  and work on meal planning and easy cooking plans  Senora has agreed to follow up with our clinic in 2 weeks. She was informed of the importance of frequent follow up visits to maximize her success with intensive lifestyle modifications for her multiple health conditions. She was informed we would discuss her lab results at her next visit unless there is a critical issue that needs to be addressed sooner. Mandeep agreed to keep her next visit at the agreed upon time to discuss these results.    OBESITY BEHAVIORAL INTERVENTION VISIT  Today's visit was # 1 out of 22.  Starting weight: 180 lbs Starting date: 08/15/17 Today's weight : 180 lbs Today's date: 08/15/2017 Total lbs lost to date: 0    ASK: We discussed the diagnosis of obesity with Deland Pretty today and Shawnise agreed to give [name] permission to discuss obesity behavioral modification therapy today.  ASSESS: Levina has the diagnosis of obesity and her BMI  today is 29.07  .bmi Kristien is in the action stage of change   ADVISE: Tashae was educated on the multiple health risks of obesity as well as the benefit of weight loss to improve her health. She was advised of the need for long term treatment and the importance of lifestyle modifications.  AGREE: Multiple dietary modification options and treatment options were discussed and  Maeve agreed to the above obesity treatment plan.   I, Doreene Nest, am acting as transcriptionist for Dennard Nip, MD   I have reviewed the above documentation for accuracy and completeness, and I agree with the above. -Dennard Nip, MD

## 2017-08-16 LAB — COMPREHENSIVE METABOLIC PANEL
ALBUMIN: 4.3 g/dL (ref 3.5–5.5)
ALK PHOS: 101 IU/L (ref 39–117)
ALT: 20 IU/L (ref 0–32)
AST: 24 IU/L (ref 0–40)
Albumin/Globulin Ratio: 1.4 (ref 1.2–2.2)
BILIRUBIN TOTAL: 0.5 mg/dL (ref 0.0–1.2)
BUN / CREAT RATIO: 18 (ref 9–23)
BUN: 15 mg/dL (ref 6–24)
CHLORIDE: 99 mmol/L (ref 96–106)
CO2: 26 mmol/L (ref 20–29)
Calcium: 9.8 mg/dL (ref 8.7–10.2)
Creatinine, Ser: 0.84 mg/dL (ref 0.57–1.00)
GFR calc Af Amer: 88 mL/min/{1.73_m2} (ref >59)
GFR calc non Af Amer: 76 mL/min/{1.73_m2} (ref >59)
GLUCOSE: 99 mg/dL (ref 65–99)
Globulin, Total: 3 g/dL (ref 1.5–4.5)
Potassium: 4.8 mmol/L (ref 3.5–5.2)
Sodium: 141 mmol/L (ref 134–144)
Total Protein: 7.3 g/dL (ref 6.0–8.5)

## 2017-08-16 LAB — LIPID PANEL WITH LDL/HDL RATIO
Cholesterol, Total: 232 mg/dL — ABNORMAL HIGH (ref 100–199)
HDL: 35 mg/dL — ABNORMAL LOW (ref >39)
LDL Calculated: 155 mg/dL — ABNORMAL HIGH (ref 0–99)
LDL/HDL RATIO: 4.4 ratio — AB (ref 0.0–3.2)
TRIGLYCERIDES: 211 mg/dL — AB (ref 0–149)
VLDL Cholesterol Cal: 42 mg/dL — ABNORMAL HIGH (ref 5–40)

## 2017-08-16 LAB — CBC WITH DIFFERENTIAL
Basophils Absolute: 0 10*3/uL (ref 0.0–0.2)
Basos: 1 %
EOS (ABSOLUTE): 0.3 10*3/uL (ref 0.0–0.4)
EOS: 5 %
HEMATOCRIT: 43.7 % (ref 34.0–46.6)
Hemoglobin: 14.2 g/dL (ref 11.1–15.9)
IMMATURE GRANULOCYTES: 0 %
Immature Grans (Abs): 0 10*3/uL (ref 0.0–0.1)
Lymphocytes Absolute: 2.2 10*3/uL (ref 0.7–3.1)
Lymphs: 35 %
MCH: 30.1 pg (ref 26.6–33.0)
MCHC: 32.5 g/dL (ref 31.5–35.7)
MCV: 93 fL (ref 79–97)
MONOS ABS: 0.5 10*3/uL (ref 0.1–0.9)
Monocytes: 8 %
NEUTROS PCT: 51 %
Neutrophils Absolute: 3.2 10*3/uL (ref 1.4–7.0)
RBC: 4.72 x10E6/uL (ref 3.77–5.28)
RDW: 14.5 % (ref 12.3–15.4)
WBC: 6.3 10*3/uL (ref 3.4–10.8)

## 2017-08-16 LAB — T3: T3 TOTAL: 107 ng/dL (ref 71–180)

## 2017-08-16 LAB — TSH: TSH: 3.81 u[IU]/mL (ref 0.450–4.500)

## 2017-08-16 LAB — VITAMIN B12: VITAMIN B 12: 484 pg/mL (ref 232–1245)

## 2017-08-16 LAB — VITAMIN D 25 HYDROXY (VIT D DEFICIENCY, FRACTURES): VIT D 25 HYDROXY: 27.1 ng/mL — AB (ref 30.0–100.0)

## 2017-08-16 LAB — T4, FREE: FREE T4: 1.1 ng/dL (ref 0.82–1.77)

## 2017-08-16 LAB — INSULIN, RANDOM: INSULIN: 11.7 u[IU]/mL (ref 2.6–24.9)

## 2017-08-16 LAB — HEMOGLOBIN A1C
ESTIMATED AVERAGE GLUCOSE: 123 mg/dL
Hgb A1c MFr Bld: 5.9 % — ABNORMAL HIGH (ref 4.8–5.6)

## 2017-08-16 LAB — FOLATE: FOLATE: 17.7 ng/mL (ref >3.0)

## 2017-08-29 ENCOUNTER — Ambulatory Visit (INDEPENDENT_AMBULATORY_CARE_PROVIDER_SITE_OTHER): Payer: 59 | Admitting: Family Medicine

## 2017-08-29 VITALS — BP 134/66 | HR 69 | Temp 98.0°F | Ht 66.0 in | Wt 179.0 lb

## 2017-08-29 DIAGNOSIS — Z9189 Other specified personal risk factors, not elsewhere classified: Secondary | ICD-10-CM

## 2017-08-29 DIAGNOSIS — E559 Vitamin D deficiency, unspecified: Secondary | ICD-10-CM | POA: Diagnosis not present

## 2017-08-29 DIAGNOSIS — E7849 Other hyperlipidemia: Secondary | ICD-10-CM | POA: Diagnosis not present

## 2017-08-29 DIAGNOSIS — E669 Obesity, unspecified: Secondary | ICD-10-CM | POA: Diagnosis not present

## 2017-08-29 DIAGNOSIS — R7303 Prediabetes: Secondary | ICD-10-CM

## 2017-08-29 DIAGNOSIS — Z6830 Body mass index (BMI) 30.0-30.9, adult: Secondary | ICD-10-CM

## 2017-08-29 DIAGNOSIS — E66811 Obesity, class 1: Secondary | ICD-10-CM

## 2017-08-29 MED ORDER — VITAMIN D (ERGOCALCIFEROL) 1.25 MG (50000 UNIT) PO CAPS: 50000.0000 [IU] | ORAL_CAPSULE | ORAL | 0 refills | Status: DC

## 2017-08-29 NOTE — Progress Notes (Signed)
Office: [phone]  /  Fax: [phone]   HPI:   Chief Complaint: OBESITY Rebecca Rogers is here to discuss her progress with her obesity treatment plan. She is on the Category 2 plan and is following her eating plan approximately 75 % of the time. She states she is exercising 0 minutes 0 times per week. [name] did well for lunch and snacks, but deviated significantly for dinner and breakfast.    Her weight is 179 lb (81.2 kg) today and has had a weight loss of 1 pounds over a period of 2 weeks since her last visit. She has lost 1 lb since starting treatment with [name].  Hyperlipidemia (mixed) [name] has a new diagnosis of hyperlipidemia with decreased HDL, increased LDL and increased triglycerides. She is not on a statin medication. She would like to try to improve her cholesterol levels with intensive lifestyle modification including a low saturated fat diet, exercise and weight loss. She denies any chest pain, claudication or myalgias.  Vitamin D deficiency [name] has a new diagnosis of vitamin D deficiency. Her vitamin D level is low.  She is not currently taking vit D. She admits fatigue and denies nausea, vomiting or muscle weakness.  Pre-Diabetes [name] has a new diagnosis of pre-diabetes based on her elevated Hgb A1c and was informed this puts her at greater risk of developing diabetes. Her A1c and fasting insulin are elevated. She notes polyphagia, especially after eating a higher carb, lower protein meal. She is not taking metformin currently and continues to work on diet and exercise to decrease risk of diabetes. She denies nausea or hypoglycemia.  At risk for diabetes [name] is at higher than average risk for developing diabetes due to her pre-diabetes and obesity. She currently denies polyuria or polydipsia.  ALLERGIES: No Known Allergies  MEDICATIONS: Current Outpatient Medications on File Prior to Visit  Medication Sig Dispense Refill  . levothyroxine (SYNTHROID) 50 MCG tablet  Take 0.5 tablets (25 mcg total) by mouth daily before breakfast. 90 tablet 1  . nystatin-triamcinolone (MYCOLOG II) cream Apply 1 application topically 2 (two) times daily. 30 g 1  . sertraline (ZOLOFT) 50 MG tablet Take 1.5 tablets (75 mg total) by mouth daily. 1 & 1/2 tab daily 135 tablet 4  . triamterene-hydrochlorothiazide (MAXZIDE-25) 37.5-25 MG tablet Take 1 tablet by mouth daily. 90 tablet 4   No current facility-administered medications on file prior to visit.     PAST MEDICAL HISTORY: Past Medical History:  Diagnosis Date  . Adenomyosis   . Anxiety   . Cyst of right breast    4-5 oclock 1/2 cm  . HLD (hyperlipidemia)   . Hypertension    bordeline  . Hypothyroidism   . Increased BMI     PAST SURGICAL HISTORY: Past Surgical History:  Procedure Laterality Date  . ABDOMINAL HYSTERECTOMY     lsh- adenomysis and fibroids  . BREAST BIOPSY Right 2016   NEG  . COLONOSCOPY WITH PROPOFOL N/A 06/05/2015   Procedure: COLONOSCOPY WITH PROPOFOL;  Surgeon: Lucilla Lame, MD;  Location: [place] [place];  Service: Endoscopy;  Laterality: N/A;  . COLONOSCOPY WITH PROPOFOL N/A 12/04/2015   Procedure: COLONOSCOPY WITH PROPOFOL;  Surgeon: Lucilla Lame, MD;  Location: [place];  Service: Endoscopy;  Laterality: N/A;  . LAPAROSCOPIC SALPINGOOPHERECTOMY     prophylactic  . POLYPECTOMY  06/05/2015   Procedure: POLYPECTOMY;  Surgeon: Lucilla Lame, MD;  Location: [facility];  Service: Endoscopy;;  . POLYPECTOMY  12/04/2015   Procedure: POLYPECTOMY;  Surgeon: Lucilla Lame, MD;  Location: [place];  Service: Endoscopy;;  . TONSILLECTOMY  1965    SOCIAL HISTORY: Social History   Tobacco Use  . Smoking status: Never Smoker  . Smokeless tobacco: Never Used  Substance Use Topics  . Alcohol use: No  . Drug use: No    FAMILY HISTORY: Family History  Problem Relation Age of Onset  . Ovarian cancer Mother   . Prostate cancer Father   . Breast cancer Neg Hx   .  Colon cancer Neg Hx   . Diabetes Neg Hx   . Heart disease Neg Hx     ROS: Review of Systems  Constitutional: Positive for malaise/fatigue and weight loss.  Cardiovascular: Negative for chest pain and claudication.  Gastrointestinal: Negative for nausea and vomiting.  Genitourinary:       Positive for polyphagia. Negative for polyuria.  Musculoskeletal: Negative for myalgias.       Negative for muscle weakness.  Endo/Heme/Allergies: Negative for polydipsia.       Negative for hypoglycemia.    PHYSICAL EXAM: Blood pressure 134/66, pulse 69, temperature 98 F (36.7 C), temperature source Oral, height 5\' 6"  (1.676 m), weight 179 lb (81.2 kg), SpO2 97 %. Body mass index is 28.89 kg/m. Physical Exam  Constitutional: She is oriented to person, place, and time. She appears well-developed and well-nourished.  Cardiovascular: Normal rate.  Pulmonary/Chest: Effort normal.  Musculoskeletal: Normal range of motion.  Neurological: She is oriented to person, place, and time.  Skin: Skin is warm and dry.  Psychiatric: She has a normal mood and affect. Her behavior is normal.  Vitals reviewed.   RECENT LABS AND TESTS: BMET    Component Value Date/Time   NA 141 08/15/2017 0940   K 4.8 08/15/2017 0940   CL 99 08/15/2017 0940   CO2 26 08/15/2017 0940   GLUCOSE 99 08/15/2017 0940   BUN 15 08/15/2017 0940   CREATININE 0.84 08/15/2017 0940   CALCIUM 9.8 08/15/2017 0940   GFRNONAA 76 08/15/2017 0940   GFRAA 88 08/15/2017 0940   Lab Results  Component Value Date   HGBA1C 5.9 (H) 08/15/2017   HGBA1C 5.9 (H) 06/22/2017   HGBA1C 5.8 (H) 04/08/2016   HGBA1C 6.0 (H) 04/21/2015   Lab Results  Component Value Date   INSULIN 11.7 08/15/2017   CBC    Component Value Date/Time   WBC 6.3 08/15/2017 0940   RBC 4.72 08/15/2017 0940   HGB 14.2 08/15/2017 0940   HCT 43.7 08/15/2017 0940   PLT 365 06/03/2015 1045   MCV 93 08/15/2017 0940   MCH 30.1 08/15/2017 0940   MCHC 32.5 08/15/2017  0940   RDW 14.5 08/15/2017 0940   LYMPHSABS 2.2 08/15/2017 0940   EOSABS 0.3 08/15/2017 0940   BASOSABS 0.0 08/15/2017 0940   Iron/TIBC/Ferritin/ %Sat No results found for: IRON, TIBC, FERRITIN, IRONPCTSAT Lipid Panel     Component Value Date/Time   CHOL 232 (H) 08/15/2017 0940   TRIG 211 (H) 08/15/2017 0940   HDL 35 (L) 08/15/2017 0940   CHOLHDL 6.0 (H) 06/22/2017 0826   LDLCALC 155 (H) 08/15/2017 0940   Hepatic Function Panel     Component Value Date/Time   PROT 7.3 08/15/2017 0940   ALBUMIN 4.3 08/15/2017 0940   AST 24 08/15/2017 0940   ALT 20 08/15/2017 0940   ALKPHOS 101 08/15/2017 0940   BILITOT 0.5 08/15/2017 0940      Component Value Date/Time   TSH 3.810 08/15/2017 0940  TSH 2.760 06/22/2017 0826   TSH 3.750 07/25/2016 1258   Results for RACHAEL, FERRIE (MRN [id number]) as of 08/30/2017 09:14  Ref. Range 08/15/2017 09:40  Vitamin D, 25-Hydroxy Latest Ref Range: 30.0 - 100.0 ng/mL 27.1 (L)   ASSESSMENT AND PLAN: Other hyperlipidemia  Vitamin D deficiency - Plan: Vitamin D, Ergocalciferol, (DRISDOL) 50000 units CAPS capsule  Prediabetes  At risk for diabetes mellitus  Class 1 obesity with serious comorbidity and body mass index (BMI) of 30.0 to 30.9 in adult, unspecified obesity type - BMI greater than 30 at start of program   PLAN:  Hyperlipidemia Carollynn was informed of the American Heart Association Guidelines emphasizing intensive lifestyle modifications as the first line treatment for hyperlipidemia. We discussed many lifestyle modifications today in depth, and Roxanne will continue to work on decreasing saturated fats such as fatty red meat, butter and many fried foods. She will also increase vegetables and lean protein in her diet and continue to work on exercise and weight loss efforts. We will recheck labs in 3 months.   Vitamin D Deficiency Trina was informed that low vitamin D levels contributes to fatigue and are associated with obesity, breast,  and colon cancer. She agrees to start to take prescription Vit D @50 ,000 IU every week #4 with no refills and will follow up for routine testing of vitamin D, at least 2-3 times per year. She was informed of the risk of over-replacement of vitamin D and agrees to not increase her dose unless she discusses this with [name] first. Tobin agrees to follow up in 2 weeks.  Pre-Diabetes Jianna will continue to work on diet, weight loss, exercise, and decreasing simple carbohydrates in her diet to help decrease the risk of diabetes. We dicussed metformin including benefits and risks. She was informed that eating too many simple carbohydrates or too many calories at one sitting increases the likelihood of GI side effects. Daleigh will consider metformin if she continues to struggle and a prescription was not written today. Carleigh agreed to follow up with [name] as directed to monitor her progress.  Diabetes risk counseling Rana was given extended (30 minutes) diabetes prevention counseling today. She is 62 y.o. female and has risk factors for diabetes including pre-diabetes and obesity. We discussed intensive lifestyle modifications today with an emphasis on weight loss as well as increasing exercise and decreasing simple carbohydrates in her diet.  Obesity Nkechi is currently in the action stage of change. As such, her goal is to continue with weight loss efforts. She has agreed to follow the Category 2 plan. Keaisha has been instructed to work up to a goal of 150 minutes of combined cardio and strengthening exercise per week for weight loss and overall health benefits. We discussed the following Behavioral Modification Strategies today: increasing lean protein intake, decreasing simple carbohydrates, work on meal planning and easy cooking plans, and no skipping meals.  Shakeya has agreed to follow up with our clinic in 2 weeks. She was informed of the importance of frequent follow up visits to maximize her success  with intensive lifestyle modifications for her multiple health conditions.   OBESITY BEHAVIORAL INTERVENTION VISIT  Today's visit was # 2   Starting weight: 180 Starting date: 08/15/17 Today's weight : 179 lb (81.2 kg)  Today's date: 08/29/2017 Total lbs lost to date: 1 lb At least 15 minutes were spent on discussing the following behavioral intervention visit.   ASK: We discussed the diagnosis of obesity with Deland Pretty today  and Danique agreed to give [name] permission to discuss obesity behavioral modification therapy today.  ASSESS: Ysabelle has the diagnosis of obesity and her BMI today is 28.89.  Tailer is in the action stage of change   ADVISE: Francis was educated on the multiple health risks of obesity as well as the benefit of weight loss to improve her health. She was advised of the need for long term treatment and the importance of lifestyle modifications to improve her current health and to decrease her risk of future health problems.  AGREE: Multiple dietary modification options and treatment options were discussed and  Benigna agreed to follow the recommendations documented in the above note.  ARRANGE: Melvina was educated on the importance of frequent visits to treat obesity as outlined per CMS and USPSTF guidelines and agreed to schedule her next follow up appointment today.  I, Marcille Blanco, am acting as transcriptionist for Starlyn Skeans, MD  I have reviewed the above documentation for accuracy and completeness, and I agree with the above. -Dennard Nip, MD

## 2017-09-14 ENCOUNTER — Ambulatory Visit (INDEPENDENT_AMBULATORY_CARE_PROVIDER_SITE_OTHER): Payer: 59 | Admitting: Family Medicine

## 2017-09-14 VITALS — BP 120/74 | HR 73 | Temp 98.1°F | Ht 66.0 in | Wt 176.0 lb

## 2017-09-14 DIAGNOSIS — Z6830 Body mass index (BMI) 30.0-30.9, adult: Secondary | ICD-10-CM

## 2017-09-14 DIAGNOSIS — I10 Essential (primary) hypertension: Secondary | ICD-10-CM | POA: Diagnosis not present

## 2017-09-14 DIAGNOSIS — E669 Obesity, unspecified: Secondary | ICD-10-CM | POA: Diagnosis not present

## 2017-09-18 NOTE — Progress Notes (Signed)
Office: [phone]  /  Fax: [phone]   HPI:   Chief Complaint: OBESITY Rebecca Rogers is here to discuss her progress with her obesity treatment plan. She is on the Category 2 plan and is following her eating plan approximately 90-95 % of the time. She states she is exercising 0 minutes 0 times per week. Teneisha continues to do well with weight loss on her Category 2 plan. Her hunger is controlled and she has switched lunch and supper and feels this works better for her.  Her weight is 176 lb (79.8 kg) today and has had a weight loss of 3 pounds over a period of 2 weeks since her last visit. She has lost 4 lbs since starting treatment with [name].  Hypertension [name] is a 62 y.o. female with hypertension. [name]'s blood pressure is improving with weight loss and change in diet. She denies chest pain, headache, or lightheadedness. She is working weight loss to help control her blood pressure with the goal of decreasing her risk of heart attack and stroke. [name]'s blood pressure is currently controlled.  ALLERGIES: No Known Allergies  MEDICATIONS: Current Outpatient Medications on File Prior to Visit  Medication Sig Dispense Refill  . levothyroxine (SYNTHROID) 50 MCG tablet Take 0.5 tablets (25 mcg total) by mouth daily before breakfast. 90 tablet 1  . nystatin-triamcinolone (MYCOLOG II) cream Apply 1 application topically 2 (two) times daily. 30 g 1  . sertraline (ZOLOFT) 50 MG tablet Take 1.5 tablets (75 mg total) by mouth daily. 1 & 1/2 tab daily 135 tablet 4  . triamterene-hydrochlorothiazide (MAXZIDE-25) 37.5-25 MG tablet Take 1 tablet by mouth daily. 90 tablet 4  . Vitamin D, Ergocalciferol, (DRISDOL) 50000 units CAPS capsule Take 1 capsule (50,000 Units total) by mouth every 7 (seven) days. 4 capsule 0   No current facility-administered medications on file prior to visit.     PAST MEDICAL HISTORY: Past Medical History:  Diagnosis Date  . Adenomyosis   . Anxiety   . Cyst of  right breast    4-5 oclock 1/2 cm  . HLD (hyperlipidemia)   . Hypertension    bordeline  . Hypothyroidism   . Increased BMI     PAST SURGICAL HISTORY: Past Surgical History:  Procedure Laterality Date  . ABDOMINAL HYSTERECTOMY     lsh- adenomysis and fibroids  . BREAST BIOPSY Right 2016   NEG  . COLONOSCOPY WITH PROPOFOL N/A 06/05/2015   Procedure: COLONOSCOPY WITH PROPOFOL;  Surgeon: Lucilla Lame, MD;  Location: [facility];  Service: Endoscopy;  Laterality: N/A;  . COLONOSCOPY WITH PROPOFOL N/A 12/04/2015   Procedure: COLONOSCOPY WITH PROPOFOL;  Surgeon: Lucilla Lame, MD;  Location: [place];  Service: Endoscopy;  Laterality: N/A;  . LAPAROSCOPIC SALPINGOOPHERECTOMY     prophylactic  . POLYPECTOMY  06/05/2015   Procedure: POLYPECTOMY;  Surgeon: Lucilla Lame, MD;  Location: [place];  Service: Endoscopy;;  . POLYPECTOMY  12/04/2015   Procedure: POLYPECTOMY;  Surgeon: Lucilla Lame, MD;  Location: [place] [facility];  Service: Endoscopy;;  . TONSILLECTOMY  1965    SOCIAL HISTORY: Social History   Tobacco Use  . Smoking status: Never Smoker  . Smokeless tobacco: Never Used  Substance Use Topics  . Alcohol use: No  . Drug use: No    FAMILY HISTORY: Family History  Problem Relation Age of Onset  . Ovarian cancer Mother   . Prostate cancer Father   . Breast cancer Neg Hx   . Colon cancer  Neg Hx   . Diabetes Neg Hx   . Heart disease Neg Hx     ROS: Review of Systems  Constitutional: Positive for weight loss.  Cardiovascular: Negative for chest pain.  Neurological: Negative for headaches.       Negative lightheadedness    PHYSICAL EXAM: Blood pressure 120/74, pulse 73, temperature 98.1 F (36.7 C), temperature source Oral, height 5\' 6"  (1.676 m), weight 176 lb (79.8 kg), SpO2 97 %. Body mass index is 28.41 kg/m. Physical Exam  Constitutional: She is oriented to person, place, and time. She appears well-developed and well-nourished.    Cardiovascular: Normal rate.  Pulmonary/Chest: Effort normal.  Musculoskeletal: Normal range of motion.  Neurological: She is oriented to person, place, and time.  Skin: Skin is warm and dry.  Psychiatric: She has a normal mood and affect. Her behavior is normal.  Vitals reviewed.   RECENT LABS AND TESTS: BMET    Component Value Date/Time   NA 141 08/15/2017 0940   K 4.8 08/15/2017 0940   CL 99 08/15/2017 0940   CO2 26 08/15/2017 0940   GLUCOSE 99 08/15/2017 0940   BUN 15 08/15/2017 0940   CREATININE 0.84 08/15/2017 0940   CALCIUM 9.8 08/15/2017 0940   GFRNONAA 76 08/15/2017 0940   GFRAA 88 08/15/2017 0940   Lab Results  Component Value Date   HGBA1C 5.9 (H) 08/15/2017   HGBA1C 5.9 (H) 06/22/2017   HGBA1C 5.8 (H) 04/08/2016   HGBA1C 6.0 (H) 04/21/2015   Lab Results  Component Value Date   INSULIN 11.7 08/15/2017   CBC    Component Value Date/Time   WBC 6.3 08/15/2017 0940   RBC 4.72 08/15/2017 0940   HGB 14.2 08/15/2017 0940   HCT 43.7 08/15/2017 0940   PLT 365 06/03/2015 1045   MCV 93 08/15/2017 0940   MCH 30.1 08/15/2017 0940   MCHC 32.5 08/15/2017 0940   RDW 14.5 08/15/2017 0940   LYMPHSABS 2.2 08/15/2017 0940   EOSABS 0.3 08/15/2017 0940   BASOSABS 0.0 08/15/2017 0940   Iron/TIBC/Ferritin/ %Sat No results found for: IRON, TIBC, FERRITIN, IRONPCTSAT Lipid Panel     Component Value Date/Time   CHOL 232 (H) 08/15/2017 0940   TRIG 211 (H) 08/15/2017 0940   HDL 35 (L) 08/15/2017 0940   CHOLHDL 6.0 (H) 06/22/2017 0826   LDLCALC 155 (H) 08/15/2017 0940   Hepatic Function Panel     Component Value Date/Time   PROT 7.3 08/15/2017 0940   ALBUMIN 4.3 08/15/2017 0940   AST 24 08/15/2017 0940   ALT 20 08/15/2017 0940   ALKPHOS 101 08/15/2017 0940   BILITOT 0.5 08/15/2017 0940      Component Value Date/Time   TSH 3.810 08/15/2017 0940   TSH 2.760 06/22/2017 0826   TSH 3.750 07/25/2016 1258    ASSESSMENT AND PLAN: Essential hypertension  Class  1 obesity with serious comorbidity and body mass index (BMI) of 30.0 to 30.9 in adult, unspecified obesity type - Starting BMI greater then 30  PLAN:  Hypertension We discussed sodium restriction, working on healthy weight loss, and a regular exercise program as the means to achieve improved blood pressure control. Trisa agreed with this plan and agreed to follow up as directed. We will continue to monitor her blood pressure as well as her progress with the above lifestyle modifications. She will continue her medications, diet, and will watch for signs of hypotension as she continues her lifestyle modifications. Karli agrees to follow up with our clinic  in 2 weeks.  I spent > than 50% of the 15 minute visit on counseling as documented in the note.  Obesity Kyani is currently in the action stage of change. As such, her goal is to continue with weight loss efforts She has agreed to follow the Category 2 plan Anique has been instructed to work up to a goal of 150 minutes of combined cardio and strengthening exercise per week for weight loss and overall health benefits. We discussed the following Behavioral Modification Strategies today: increasing lean protein intake, decreasing simple carbohydrates, work on meal planning and easy cooking plans, and no skipping meals   Lujean has agreed to follow up with our clinic in 2 weeks. She was informed of the importance of frequent follow up visits to maximize her success with intensive lifestyle modifications for her multiple health conditions.   OBESITY BEHAVIORAL INTERVENTION VISIT  Today's visit was # 3   Starting weight: 180 lbs Starting date: 08/15/17 Today's weight : 176 lbs  Today's date: 09/14/2017 Total lbs lost to date: 4    ASK: We discussed the diagnosis of obesity with Deland Pretty today and [name] agreed to give [name] permission to discuss obesity behavioral modification therapy today.  ASSESS: Deni has the diagnosis of  obesity and her BMI today is 28.42 Kyan is in the action stage of change   ADVISE: Klea was educated on the multiple health risks of obesity as well as the benefit of weight loss to improve her health. She was advised of the need for long term treatment and the importance of lifestyle modifications to improve her current health and to decrease her risk of future health problems.  AGREE: Multiple dietary modification options and treatment options were discussed and  Lavere agreed to follow the recommendations documented in the above note.  ARRANGE: Zuleyka was educated on the importance of frequent visits to treat obesity as outlined per CMS and USPSTF guidelines and agreed to schedule her next follow up appointment today.  I, Trixie Dredge, am acting as transcriptionist for Dennard Nip, MD  I have reviewed the above documentation for accuracy and completeness, and I agree with the above. -Dennard Nip, MD

## 2017-09-28 ENCOUNTER — Ambulatory Visit (INDEPENDENT_AMBULATORY_CARE_PROVIDER_SITE_OTHER): Payer: 59 | Admitting: Family Medicine

## 2017-09-28 VITALS — BP 122/76 | HR 71 | Temp 98.3°F | Ht 66.0 in | Wt 173.0 lb

## 2017-09-28 DIAGNOSIS — Z6830 Body mass index (BMI) 30.0-30.9, adult: Secondary | ICD-10-CM | POA: Diagnosis not present

## 2017-09-28 DIAGNOSIS — Z9189 Other specified personal risk factors, not elsewhere classified: Secondary | ICD-10-CM | POA: Diagnosis not present

## 2017-09-28 DIAGNOSIS — E559 Vitamin D deficiency, unspecified: Secondary | ICD-10-CM | POA: Diagnosis not present

## 2017-09-28 DIAGNOSIS — E669 Obesity, unspecified: Secondary | ICD-10-CM | POA: Diagnosis not present

## 2017-09-28 DIAGNOSIS — R7303 Prediabetes: Secondary | ICD-10-CM | POA: Diagnosis not present

## 2017-09-28 MED ORDER — VITAMIN D (ERGOCALCIFEROL) 1.25 MG (50000 UNIT) PO CAPS: 50000.0000 [IU] | ORAL_CAPSULE | ORAL | 0 refills | Status: DC

## 2017-10-02 NOTE — Progress Notes (Signed)
Office: [phone]  /  Fax: [phone]   HPI:   Chief Complaint: OBESITY Rebecca Rogers is here to discuss her progress with her obesity treatment plan. She is on the Category 2 plan and is following her eating plan approximately 90 to 95 % of the time. She states she is exercising 0 minutes 0 times per week. Syan continues to do very well with her Category 2 plan. She is getting good support at home and notes hunger is controlled. She feels this is Rogers easy to do and is ready to start considering exercise.  Her weight is 173 lb (78.5 kg) today and has had a weight loss of 3 pounds over a period of 2 weeks since her last visit. She has lost 7 lbs since starting treatment with [name].  Vitamin D deficiency Rebecca Rogers has a diagnosis of vitamin D deficiency. She is currently taking vit D. Her vitamin D level is not yet at goal. We will recheck labs in 2 months. She denies nausea, vomiting or muscle weakness.  Pre-Diabetes Rebecca Rogers has a diagnosis of pre-diabetes based on her elevated Hgb A1c and was informed this puts her at greater risk of developing diabetes. She is not taking metformin currently and continues to work on diet, exercise, and weight loss to decrease risk of diabetes. She is doing well with decreasing simple carbs in her diet.   At risk for diabetes Rebecca Rogers is at higher than average risk for developing diabetes due to her pre-diabetes and obesity.  ALLERGIES: No Known Allergies  MEDICATIONS: Current Outpatient Medications on File Prior to Visit  Medication Sig Dispense Refill  . levothyroxine (SYNTHROID) 50 MCG tablet Take 0.5 tablets (25 mcg total) by mouth daily before breakfast. 90 tablet 1  . nystatin-triamcinolone (MYCOLOG II) cream Apply 1 application topically 2 (two) times daily. 30 g 1  . sertraline (ZOLOFT) 50 MG tablet Take 1.5 tablets (75 mg total) by mouth daily. 1 & 1/2 tab daily 135 tablet 4  . triamterene-hydrochlorothiazide (MAXZIDE-25) 37.5-25 MG tablet Take 1  tablet by mouth daily. 90 tablet 4   No current facility-administered medications on file prior to visit.     PAST MEDICAL HISTORY: Past Medical History:  Diagnosis Date  . Adenomyosis   . Anxiety   . Cyst of right breast    4-5 oclock 1/2 cm  . HLD (hyperlipidemia)   . Hypertension    bordeline  . Hypothyroidism   . Increased BMI     PAST SURGICAL HISTORY: Past Surgical History:  Procedure Laterality Date  . ABDOMINAL HYSTERECTOMY     lsh- adenomysis and fibroids  . BREAST BIOPSY Right 2016   NEG  . COLONOSCOPY WITH PROPOFOL N/A 06/05/2015   Procedure: COLONOSCOPY WITH PROPOFOL;  Surgeon: Lucilla Lame, MD;  Location: [facility];  Service: Endoscopy;  Laterality: N/A;  . COLONOSCOPY WITH PROPOFOL N/A 12/04/2015   Procedure: COLONOSCOPY WITH PROPOFOL;  Surgeon: Lucilla Lame, MD;  Location: [place];  Service: Endoscopy;  Laterality: N/A;  . LAPAROSCOPIC SALPINGOOPHERECTOMY     prophylactic  . POLYPECTOMY  06/05/2015   Procedure: POLYPECTOMY;  Surgeon: Lucilla Lame, MD;  Location: [place];  Service: Endoscopy;;  . POLYPECTOMY  12/04/2015   Procedure: POLYPECTOMY;  Surgeon: Lucilla Lame, MD;  Location: [place];  Service: Endoscopy;;  . TONSILLECTOMY  1965    SOCIAL HISTORY: Social History   Tobacco Use  . Smoking status: Never Smoker  . Smokeless tobacco: Never Used  Substance Use Topics  .  Alcohol use: No  . Drug use: No    FAMILY HISTORY: Family History  Problem Relation Age of Onset  . Ovarian cancer Mother   . Prostate cancer Father   . Breast cancer Neg Hx   . Colon cancer Neg Hx   . Diabetes Neg Hx   . Heart disease Neg Hx     ROS: Review of Systems  Constitutional: Positive for weight loss.  Gastrointestinal: Negative for nausea and vomiting.  Musculoskeletal:       Negative for muscle weakness.    PHYSICAL EXAM: Blood pressure 122/76, pulse 71, temperature 98.3 F (36.8 C), temperature source Oral, height 5'  6" (1.676 m), weight 173 lb (78.5 kg), SpO2 96 %. Body mass index is 27.92 kg/m. Physical Exam  Constitutional: She is oriented to person, place, and time. She appears well-developed and well-nourished.  Cardiovascular: Normal rate.  Pulmonary/Chest: Effort normal.  Musculoskeletal: Normal range of motion.  Neurological: She is oriented to person, place, and time.  Skin: Skin is warm and dry.  Psychiatric: She has a normal mood and affect. Her behavior is normal.  Vitals reviewed.   RECENT LABS AND TESTS: BMET    Component Value Date/Time   NA 141 08/15/2017 0940   K 4.8 08/15/2017 0940   CL 99 08/15/2017 0940   CO2 26 08/15/2017 0940   GLUCOSE 99 08/15/2017 0940   BUN 15 08/15/2017 0940   CREATININE 0.84 08/15/2017 0940   CALCIUM 9.8 08/15/2017 0940   GFRNONAA 76 08/15/2017 0940   GFRAA 88 08/15/2017 0940   Lab Results  Component Value Date   HGBA1C 5.9 (H) 08/15/2017   HGBA1C 5.9 (H) 06/22/2017   HGBA1C 5.8 (H) 04/08/2016   HGBA1C 6.0 (H) 04/21/2015   Lab Results  Component Value Date   INSULIN 11.7 08/15/2017   CBC    Component Value Date/Time   WBC 6.3 08/15/2017 0940   RBC 4.72 08/15/2017 0940   HGB 14.2 08/15/2017 0940   HCT 43.7 08/15/2017 0940   PLT 365 06/03/2015 1045   MCV 93 08/15/2017 0940   MCH 30.1 08/15/2017 0940   MCHC 32.5 08/15/2017 0940   RDW 14.5 08/15/2017 0940   LYMPHSABS 2.2 08/15/2017 0940   EOSABS 0.3 08/15/2017 0940   BASOSABS 0.0 08/15/2017 0940   Iron/TIBC/Ferritin/ %Sat No results found for: IRON, TIBC, FERRITIN, IRONPCTSAT Lipid Panel     Component Value Date/Time   CHOL 232 (H) 08/15/2017 0940   TRIG 211 (H) 08/15/2017 0940   HDL 35 (L) 08/15/2017 0940   CHOLHDL 6.0 (H) 06/22/2017 0826   LDLCALC 155 (H) 08/15/2017 0940   Hepatic Function Panel     Component Value Date/Time   PROT 7.3 08/15/2017 0940   ALBUMIN 4.3 08/15/2017 0940   AST 24 08/15/2017 0940   ALT 20 08/15/2017 0940   ALKPHOS 101 08/15/2017 0940    BILITOT 0.5 08/15/2017 0940      Component Value Date/Time   TSH 3.810 08/15/2017 0940   TSH 2.760 06/22/2017 0826   TSH 3.750 07/25/2016 1258   Results for MCKAYLIE, VASEY (MRN [id number]) as of 10/02/2017 10:58  Ref. Range 08/15/2017 09:40  Vitamin D, 25-Hydroxy Latest Ref Range: 30.0 - 100.0 ng/mL 27.1 (L)   ASSESSMENT AND PLAN: Vitamin D deficiency - Plan: Vitamin D, Ergocalciferol, (DRISDOL) 50000 units CAPS capsule  Prediabetes  At risk for diabetes mellitus  Class 1 obesity with serious comorbidity and body mass index (BMI) of 30.0 to 30.9 in adult, unspecified  obesity type - Starting BMI greater then 30  PLAN:  Vitamin D Deficiency Rebecca Rogers was informed that low vitamin D levels contributes to fatigue and are associated with obesity, breast, and colon cancer. She agrees to continue to take prescription Vit D @50 ,000 IU every week #4 with no refills and will follow up for routine testing of vitamin D, at least 2-3 times per year. She was informed of the risk of over-replacement of vitamin D and agrees to not increase her dose unless she discusses this with [name] first. Rebecca Rogers agrees to follow up in 2 to 3 weeks.  Pre-Diabetes Rebecca Rogers will continue to work on diet, weight loss, exercise, and decreasing simple carbohydrates in her diet to help decrease the risk of diabetes. She was informed that eating too many simple carbohydrates or too many calories at one sitting increases the likelihood of GI side effects. We will recheck labs in 2 weeks. Rebecca Rogers agreed to follow up with [name] as directed to monitor her progress.  Diabetes risk counseling Rebecca Rogers was given extended (25 minutes) diabetes prevention counseling today. She is 62 y.o. female and has risk factors for diabetes including pre-diabetes and obesity. We discussed intensive lifestyle modifications today with an emphasis on weight loss as well as increasing exercise and decreasing simple carbohydrates in her diet.  Obesity Rebecca Rogers is  currently in the action stage of change. As such, her goal is to continue with weight loss efforts. She has agreed to follow the Category 2 plan. Rebecca Rogers has been instructed to work up to a goal of 150 minutes of combined cardio and strengthening exercise per week for weight loss and overall health benefits. We discussed the following Behavioral Modification Strategies today: increasing lean protein intake and decreasing simple carbohydrates.   Zaryia has agreed to follow up with our clinic in 2 to 3 weeks. She was informed of the importance of frequent follow up visits to maximize her success with intensive lifestyle modifications for her multiple health conditions.   OBESITY BEHAVIORAL INTERVENTION VISIT  Today's visit was # 4   Starting weight: 180 lbs Starting date: 08/15/17 Today's weight : Weight: 173 lb (78.5 kg)  Today's date: 09/28/2017 Total lbs lost to date: 7  ASK: We discussed the diagnosis of obesity with Rebecca Rogers today and Rebecca Rogers agreed to give [name] permission to discuss obesity behavioral modification therapy today.  ASSESS: Rebecca Rogers has the diagnosis of obesity and her BMI today is 27.94 Rebecca Rogers is in the action stage of change.   ADVISE: Rebecca Rogers was educated on the multiple health risks of obesity as well as the benefit of weight loss to improve her health. She was advised of the need for long term treatment and the importance of lifestyle modifications to improve her current health and to decrease her risk of future health problems.  AGREE: Multiple dietary modification options and treatment options were discussed and Rebecca Rogers agreed to follow the recommendations documented in the above note.  ARRANGE: Rebecca Rogers was educated on the importance of frequent visits to treat obesity as outlined per CMS and USPSTF guidelines and agreed to schedule her next follow up appointment today.  I, Marcille Blanco, am acting as transcriptionist for Starlyn Skeans, MD  I have reviewed the  above documentation for accuracy and completeness, and I agree with the above. -Dennard Nip, MD

## 2017-10-16 ENCOUNTER — Ambulatory Visit (INDEPENDENT_AMBULATORY_CARE_PROVIDER_SITE_OTHER): Payer: 59 | Admitting: Family Medicine

## 2017-10-16 VITALS — BP 125/73 | HR 71 | Temp 98.1°F | Ht 66.0 in | Wt 172.0 lb

## 2017-10-16 DIAGNOSIS — I10 Essential (primary) hypertension: Secondary | ICD-10-CM | POA: Diagnosis not present

## 2017-10-16 DIAGNOSIS — Z6830 Body mass index (BMI) 30.0-30.9, adult: Secondary | ICD-10-CM | POA: Diagnosis not present

## 2017-10-16 DIAGNOSIS — E669 Obesity, unspecified: Secondary | ICD-10-CM | POA: Diagnosis not present

## 2017-10-18 NOTE — Progress Notes (Signed)
Office: [phone]  /  Fax: [phone]   HPI:   Chief Complaint: OBESITY Rebecca Rogers is here to discuss her progress with her obesity treatment plan. She is on the Category 2 plan and is following her eating plan approximately 90 % of the time. She states she is exercising 0 minutes 0 times per week. Rebecca Rogers was on vacation and had some increased celebration eating, but she was mindful about her food choices. She also increased walking while on vacation.  Her weight is 172 lb (78 kg) today and has had a weight loss of 1 pound over a period of 2 to 3 weeks since her last visit. She has lost 8 lbs since starting treatment with [name].  Hypertension Rebecca Rogers is a 62 y.o. female with hypertension. Rebecca Rogers's blood pressure is well controlled on medications, diet, and weight loss. She denies chest pain or headache. She is working weight loss to help control her blood pressure with the goal of decreasing her risk of heart attack and stroke.   ALLERGIES: No Known Allergies  MEDICATIONS: Current Outpatient Medications on File Prior to Visit  Medication Sig Dispense Refill  . levothyroxine (SYNTHROID) 50 MCG tablet Take 0.5 tablets (25 mcg total) by mouth daily before breakfast. 90 tablet 1  . nystatin-triamcinolone (MYCOLOG II) cream Apply 1 application topically 2 (two) times daily. 30 g 1  . sertraline (ZOLOFT) 50 MG tablet Take 1.5 tablets (75 mg total) by mouth daily. 1 & 1/2 tab daily 135 tablet 4  . triamterene-hydrochlorothiazide (MAXZIDE-25) 37.5-25 MG tablet Take 1 tablet by mouth daily. 90 tablet 4  . Vitamin D, Ergocalciferol, (DRISDOL) 50000 units CAPS capsule Take 1 capsule (50,000 Units total) by mouth every 7 (seven) days. 4 capsule 0   No current facility-administered medications on file prior to visit.     PAST MEDICAL HISTORY: Past Medical History:  Diagnosis Date  . Adenomyosis   . Anxiety   . Cyst of right breast    4-5 oclock 1/2 cm  . HLD (hyperlipidemia)   .  Hypertension    bordeline  . Hypothyroidism   . Increased BMI     PAST SURGICAL HISTORY: Past Surgical History:  Procedure Laterality Date  . ABDOMINAL HYSTERECTOMY     lsh- adenomysis and fibroids  . BREAST BIOPSY Right 2016   NEG  . COLONOSCOPY WITH PROPOFOL N/A 06/05/2015   Procedure: COLONOSCOPY WITH PROPOFOL;  Surgeon: Lucilla Lame, MD;  Location: [facility];  Service: Endoscopy;  Laterality: N/A;  . COLONOSCOPY WITH PROPOFOL N/A 12/04/2015   Procedure: COLONOSCOPY WITH PROPOFOL;  Surgeon: Lucilla Lame, MD;  Location: [place];  Service: Endoscopy;  Laterality: N/A;  . LAPAROSCOPIC SALPINGOOPHERECTOMY     prophylactic  . POLYPECTOMY  06/05/2015   Procedure: POLYPECTOMY;  Surgeon: Lucilla Lame, MD;  Location: [place];  Service: Endoscopy;;  . POLYPECTOMY  12/04/2015   Procedure: POLYPECTOMY;  Surgeon: Lucilla Lame, MD;  Location: [place];  Service: Endoscopy;;  . TONSILLECTOMY  1965    SOCIAL HISTORY: Social History   Tobacco Use  . Smoking status: Never Smoker  . Smokeless tobacco: Never Used  Substance Use Topics  . Alcohol use: No  . Drug use: No    FAMILY HISTORY: Family History  Problem Relation Age of Onset  . Ovarian cancer Mother   . Prostate cancer Father   . Breast cancer Neg Hx   . Colon cancer Neg Hx   . Diabetes Neg Hx   .  Heart disease Neg Hx     ROS: Review of Systems  Constitutional: Positive for weight loss.  Cardiovascular: Negative for chest pain.  Neurological: Negative for headaches.    PHYSICAL EXAM: Blood pressure 125/73, pulse 71, temperature 98.1 F (36.7 C), temperature source Oral, height 5\' 6"  (1.676 m), weight 172 lb (78 kg), SpO2 98 %. Body mass index is 27.76 kg/m. Physical Exam  Constitutional: She is oriented to person, place, and time. She appears well-developed and well-nourished.  Cardiovascular: Normal rate.  Pulmonary/Chest: Effort normal.  Musculoskeletal: Normal range of  motion.  Neurological: She is oriented to person, place, and time.  Skin: Skin is warm and dry.  Psychiatric: She has a normal mood and affect. Her behavior is normal.  Vitals reviewed.   RECENT LABS AND TESTS: BMET    Component Value Date/Time   NA 141 08/15/2017 0940   K 4.8 08/15/2017 0940   CL 99 08/15/2017 0940   CO2 26 08/15/2017 0940   GLUCOSE 99 08/15/2017 0940   BUN 15 08/15/2017 0940   CREATININE 0.84 08/15/2017 0940   CALCIUM 9.8 08/15/2017 0940   GFRNONAA 76 08/15/2017 0940   GFRAA 88 08/15/2017 0940   Lab Results  Component Value Date   HGBA1C 5.9 (H) 08/15/2017   HGBA1C 5.9 (H) 06/22/2017   HGBA1C 5.8 (H) 04/08/2016   HGBA1C 6.0 (H) 04/21/2015   Lab Results  Component Value Date   INSULIN 11.7 08/15/2017   CBC    Component Value Date/Time   WBC 6.3 08/15/2017 0940   RBC 4.72 08/15/2017 0940   HGB 14.2 08/15/2017 0940   HCT 43.7 08/15/2017 0940   PLT 365 06/03/2015 1045   MCV 93 08/15/2017 0940   MCH 30.1 08/15/2017 0940   MCHC 32.5 08/15/2017 0940   RDW 14.5 08/15/2017 0940   LYMPHSABS 2.2 08/15/2017 0940   EOSABS 0.3 08/15/2017 0940   BASOSABS 0.0 08/15/2017 0940   Iron/TIBC/Ferritin/ %Sat No results found for: IRON, TIBC, FERRITIN, IRONPCTSAT Lipid Panel     Component Value Date/Time   CHOL 232 (H) 08/15/2017 0940   TRIG 211 (H) 08/15/2017 0940   HDL 35 (L) 08/15/2017 0940   CHOLHDL 6.0 (H) 06/22/2017 0826   LDLCALC 155 (H) 08/15/2017 0940   Hepatic Function Panel     Component Value Date/Time   PROT 7.3 08/15/2017 0940   ALBUMIN 4.3 08/15/2017 0940   AST 24 08/15/2017 0940   ALT 20 08/15/2017 0940   ALKPHOS 101 08/15/2017 0940   BILITOT 0.5 08/15/2017 0940      Component Value Date/Time   TSH 3.810 08/15/2017 0940   TSH 2.760 06/22/2017 0826   TSH 3.750 07/25/2016 1258    ASSESSMENT AND PLAN: Essential hypertension  Class 1 obesity with serious comorbidity and body mass index (BMI) of 30.0 to 30.9 in adult, unspecified  obesity type - Starting BMI greater then 30  PLAN:  Hypertension We discussed sodium restriction, working on healthy weight loss, and a regular exercise program as the means to achieve improved blood pressure control. Rebecca Rogers agreed with this plan and agreed to follow up as directed. We will continue to monitor her blood pressure as well as her progress with the above lifestyle modifications. Rebecca Rogers agrees to continue [employer], the goal is to control with medications. She will watch for signs of hypotension as she continues her lifestyle modifications. Rebecca Rogers agrees to follow up with our clinic in 3 weeks.  I spent > than 50% of the 15  minute visit on counseling as documented in the note.  Obesity Rebecca Rogers is currently in the action stage of change. As such, her goal is to continue with weight loss efforts She has agreed to follow the Category 2 plan Rebecca Rogers has been instructed to work up to a goal of 150 minutes of combined cardio and strengthening exercise per week for weight loss and overall health benefits. We discussed the following Behavioral Modification Strategies today: increasing lean protein intake, decreasing simple carbohydrates, and travel eating strategies    Rebecca Rogers has agreed to follow up with our clinic in 3 weeks. She was informed of the importance of frequent follow up visits to maximize her success with intensive lifestyle modifications for her multiple health conditions.   OBESITY BEHAVIORAL INTERVENTION VISIT  Today's visit was # 5   Starting weight: 180 lbs Starting date: 08/15/17 Today's weight : 172 lbs  Today's date: 10/16/2017 Total lbs lost to date: 8    ASK: We discussed the diagnosis of obesity with Rebecca Rogers today and Rebecca Rogers agreed to give [name] permission to discuss obesity behavioral modification therapy today.  ASSESS: Rebecca Rogers has the diagnosis of obesity and her BMI today is 27.77 Rebecca Rogers is in the action stage of change   ADVISE: Rebecca Rogers was  educated on the multiple health risks of obesity as well as the benefit of weight loss to improve her health. She was advised of the need for long term treatment and the importance of lifestyle modifications to improve her current health and to decrease her risk of future health problems.  AGREE: Multiple dietary modification options and treatment options were discussed and  Rebecca Rogers agreed to follow the recommendations documented in the above note.  ARRANGE: Rebecca Rogers was educated on the importance of frequent visits to treat obesity as outlined per CMS and USPSTF guidelines and agreed to schedule her next follow up appointment today.  I, Trixie Dredge, am acting as transcriptionist for Dennard Nip, MD  I have reviewed the above documentation for accuracy and completeness, and I agree with the above. -Dennard Nip, MD

## 2017-10-30 ENCOUNTER — Other Ambulatory Visit: Payer: Self-pay | Admitting: Obstetrics and Gynecology

## 2017-10-30 DIAGNOSIS — N6310 Unspecified lump in the right breast, unspecified quadrant: Secondary | ICD-10-CM

## 2017-10-30 DIAGNOSIS — R928 Other abnormal and inconclusive findings on diagnostic imaging of breast: Secondary | ICD-10-CM

## 2017-10-31 ENCOUNTER — Other Ambulatory Visit: Payer: Self-pay | Admitting: Obstetrics and Gynecology

## 2017-10-31 DIAGNOSIS — R928 Other abnormal and inconclusive findings on diagnostic imaging of breast: Secondary | ICD-10-CM

## 2017-10-31 DIAGNOSIS — N6310 Unspecified lump in the right breast, unspecified quadrant: Secondary | ICD-10-CM

## 2017-11-06 ENCOUNTER — Ambulatory Visit (INDEPENDENT_AMBULATORY_CARE_PROVIDER_SITE_OTHER): Payer: 59 | Admitting: Family Medicine

## 2017-11-06 VITALS — BP 119/74 | HR 80 | Temp 97.6°F | Ht 66.0 in | Wt 169.0 lb

## 2017-11-06 DIAGNOSIS — E669 Obesity, unspecified: Secondary | ICD-10-CM

## 2017-11-06 DIAGNOSIS — E559 Vitamin D deficiency, unspecified: Secondary | ICD-10-CM

## 2017-11-06 DIAGNOSIS — Z6830 Body mass index (BMI) 30.0-30.9, adult: Secondary | ICD-10-CM

## 2017-11-06 DIAGNOSIS — Z9189 Other specified personal risk factors, not elsewhere classified: Secondary | ICD-10-CM

## 2017-11-06 MED ORDER — VITAMIN D (ERGOCALCIFEROL) 1.25 MG (50000 UNIT) PO CAPS: 50000.0000 [IU] | ORAL_CAPSULE | ORAL | 0 refills | Status: DC

## 2017-11-07 ENCOUNTER — Encounter (INDEPENDENT_AMBULATORY_CARE_PROVIDER_SITE_OTHER): Payer: Self-pay | Admitting: Family Medicine

## 2017-11-07 NOTE — Progress Notes (Signed)
Office: [phone]  /  Fax: [phone]   HPI:   Chief Complaint: OBESITY Rebecca Rogers is here to discuss her progress with her obesity treatment plan. She is on the Category 2 plan and is following her eating plan approximately 90 % of the time. She states she is exercising 0 minutes 0 times per week. Rebecca Rogers continues to do very well with weight loss on her Category 2 diet prescription. She is doing well with meal planning and prepping. She states that her hunger and cravings are under control.  Her weight is 169 lb (76.7 kg) today and has had a weight loss of 3 pounds over a period of 3 weeks since her last visit. She has lost 11 lbs since starting treatment with [name].  Vitamin D deficiency Rebecca Rogers has a diagnosis of vitamin D deficiency. She is currently taking vit D and denies nausea, vomiting, or muscle weakness. She is due for labs soon.  At risk for osteopenia and osteoporosis Rebecca Rogers is at higher risk of osteopenia and osteoporosis due to vitamin D deficiency.   ALLERGIES: No Known Allergies  MEDICATIONS: Current Outpatient Medications on File Prior to Visit  Medication Sig Dispense Refill  . levothyroxine (SYNTHROID) 50 MCG tablet Take 0.5 tablets (25 mcg total) by mouth daily before breakfast. 90 tablet 1  . nystatin-triamcinolone (MYCOLOG II) cream Apply 1 application topically 2 (two) times daily. 30 g 1  . sertraline (ZOLOFT) 50 MG tablet Take 1.5 tablets (75 mg total) by mouth daily. 1 & 1/2 tab daily 135 tablet 4  . triamterene-hydrochlorothiazide (MAXZIDE-25) 37.5-25 MG tablet Take 1 tablet by mouth daily. 90 tablet 4   No current facility-administered medications on file prior to visit.     PAST MEDICAL HISTORY: Past Medical History:  Diagnosis Date  . Adenomyosis   . Anxiety   . Cyst of right breast    4-5 oclock 1/2 cm  . HLD (hyperlipidemia)   . Hypertension    bordeline  . Hypothyroidism   . Increased BMI     PAST SURGICAL HISTORY: Past Surgical History:   Procedure Laterality Date  . ABDOMINAL HYSTERECTOMY     lsh- adenomysis and fibroids  . BREAST BIOPSY Right 2016   NEG  . COLONOSCOPY WITH PROPOFOL N/A 06/05/2015   Procedure: COLONOSCOPY WITH PROPOFOL;  Surgeon: Lucilla Lame, MD;  Location: [place];  Service: Endoscopy;  Laterality: N/A;  . COLONOSCOPY WITH PROPOFOL N/A 12/04/2015   Procedure: COLONOSCOPY WITH PROPOFOL;  Surgeon: Lucilla Lame, MD;  Location: [place];  Service: Endoscopy;  Laterality: N/A;  . LAPAROSCOPIC SALPINGOOPHERECTOMY     prophylactic  . POLYPECTOMY  06/05/2015   Procedure: POLYPECTOMY;  Surgeon: Lucilla Lame, MD;  Location: [facility];  Service: Endoscopy;;  . POLYPECTOMY  12/04/2015   Procedure: POLYPECTOMY;  Surgeon: Lucilla Lame, MD;  Location: [place];  Service: Endoscopy;;  . TONSILLECTOMY  1965    SOCIAL HISTORY: Social History   Tobacco Use  . Smoking status: Never Smoker  . Smokeless tobacco: Never Used  Substance Use Topics  . Alcohol use: No  . Drug use: No    FAMILY HISTORY: Family History  Problem Relation Age of Onset  . Ovarian cancer Mother   . Prostate cancer Father   . Breast cancer Neg Hx   . Colon cancer Neg Hx   . Diabetes Neg Hx   . Heart disease Neg Hx     ROS: Review of Systems  Constitutional: Positive for weight loss.  Gastrointestinal: Negative for nausea and vomiting.  Musculoskeletal:       Negative for muscle weakness.    PHYSICAL EXAM: Blood pressure 119/74, pulse 80, temperature 97.6 F (36.4 C), temperature source Oral, height 5\' 6"  (1.676 m), weight 169 lb (76.7 kg), SpO2 97 %. Body mass index is 27.28 kg/m. Physical Exam  Constitutional: She is oriented to person, place, and time. She appears well-developed and well-nourished.  Cardiovascular: Normal rate.  Pulmonary/Chest: Effort normal.  Musculoskeletal: Normal range of motion.  Neurological: She is oriented to person, place, and time.  Skin: Skin is warm and  dry.  Psychiatric: She has a normal mood and affect. Her behavior is normal.  Vitals reviewed.   RECENT LABS AND TESTS: BMET    Component Value Date/Time   NA 141 08/15/2017 0940   K 4.8 08/15/2017 0940   CL 99 08/15/2017 0940   CO2 26 08/15/2017 0940   GLUCOSE 99 08/15/2017 0940   BUN 15 08/15/2017 0940   CREATININE 0.84 08/15/2017 0940   CALCIUM 9.8 08/15/2017 0940   GFRNONAA 76 08/15/2017 0940   GFRAA 88 08/15/2017 0940   Lab Results  Component Value Date   HGBA1C 5.9 (H) 08/15/2017   HGBA1C 5.9 (H) 06/22/2017   HGBA1C 5.8 (H) 04/08/2016   HGBA1C 6.0 (H) 04/21/2015   Lab Results  Component Value Date   INSULIN 11.7 08/15/2017   CBC    Component Value Date/Time   WBC 6.3 08/15/2017 0940   RBC 4.72 08/15/2017 0940   HGB 14.2 08/15/2017 0940   HCT 43.7 08/15/2017 0940   PLT 365 06/03/2015 1045   MCV 93 08/15/2017 0940   MCH 30.1 08/15/2017 0940   MCHC 32.5 08/15/2017 0940   RDW 14.5 08/15/2017 0940   LYMPHSABS 2.2 08/15/2017 0940   EOSABS 0.3 08/15/2017 0940   BASOSABS 0.0 08/15/2017 0940   Iron/TIBC/Ferritin/ %Sat No results found for: IRON, TIBC, FERRITIN, IRONPCTSAT Lipid Panel     Component Value Date/Time   CHOL 232 (H) 08/15/2017 0940   TRIG 211 (H) 08/15/2017 0940   HDL 35 (L) 08/15/2017 0940   CHOLHDL 6.0 (H) 06/22/2017 0826   LDLCALC 155 (H) 08/15/2017 0940   Hepatic Function Panel     Component Value Date/Time   PROT 7.3 08/15/2017 0940   ALBUMIN 4.3 08/15/2017 0940   AST 24 08/15/2017 0940   ALT 20 08/15/2017 0940   ALKPHOS 101 08/15/2017 0940   BILITOT 0.5 08/15/2017 0940      Component Value Date/Time   TSH 3.810 08/15/2017 0940   TSH 2.760 06/22/2017 0826   TSH 3.750 07/25/2016 1258   Results for ZIARE, ORRICK (MRN [id number]) as of 11/07/2017 08:23  Ref. Range 08/15/2017 09:40  Vitamin D, 25-Hydroxy Latest Ref Range: 30.0 - 100.0 ng/mL 27.1 (L)   ASSESSMENT AND PLAN: Vitamin D deficiency - Plan: Vitamin D, Ergocalciferol,  (DRISDOL) 50000 units CAPS capsule  At risk for osteoporosis  Class 1 obesity with serious comorbidity and body mass index (BMI) of 30.0 to 30.9 in adult, unspecified obesity type - Starting BMI greater then 30  PLAN:  Vitamin D Deficiency Rebecca Rogers was informed that low vitamin D levels contributes to fatigue and are associated with obesity, breast, and colon cancer. She agrees to continue to take prescription Vit D @50 ,000 IU every week #4 with no refills and will follow up for routine testing of vitamin D, at least 2-3 times per year. She was informed of the risk of over-replacement of vitamin  D and agrees to not increase her dose unless she discusses this with [name] first. We will recheck labs at her next visit. Rebecca Rogers agrees to follow up in 2 to 3 weeks.  At risk for osteopenia and osteoporosis Rebecca Rogers was given extended (15 minutes) osteoporosis prevention counseling today. Rebecca Rogers is at risk for osteopenia and osteoporosis due to her vitamin D deficiency. She was encouraged to take her vitamin D and follow her higher calcium diet and increase strengthening exercise to help strengthen her bones and decrease her risk of osteopenia and osteoporosis.  Obesity Rebecca Rogers is currently in the action stage of change. As such, her goal is to continue with weight loss efforts. She has agreed to follow the Category 2 plan. Rebecca Rogers has been instructed to work up to a goal of 150 minutes of combined cardio and strengthening exercise per week for weight loss and overall health benefits. We discussed the following Behavioral Modification Strategies today: increasing lean protein intake, decreasing simple carbohydrates, and holiday eating strategies.   Rebecca Rogers has agreed to follow up with our clinic in 2 to 3 weeks for a fasting appointment. She was informed of the importance of frequent follow up visits to maximize her success with intensive lifestyle modifications for her multiple health conditions.   OBESITY  BEHAVIORAL INTERVENTION VISIT  Today's visit was # 6   Starting weight: 180 lbs Starting date: 08/15/17 Today's weight : Weight: 169 lb (76.7 kg)  Today's date: 11/06/2017 Total lbs lost to date: 11  ASK: We discussed the diagnosis of obesity with Rebecca Rogers today and Rebecca Rogers agreed to give [name] permission to discuss obesity behavioral modification therapy today.  ASSESS: Rebecca Rogers has the diagnosis of obesity and her BMI today is 27.29. Rebecca Rogers is in the action stage of change.   ADVISE: Rebecca Rogers was educated on the multiple health risks of obesity as well as the benefit of weight loss to improve her health. She was advised of the need for long term treatment and the importance of lifestyle modifications to improve her current health and to decrease her risk of future health problems.  AGREE: Multiple dietary modification options and treatment options were discussed and Rebecca Rogers agreed to follow the recommendations documented in the above note.  ARRANGE: Rebecca Rogers was educated on the importance of frequent visits to treat obesity as outlined per CMS and USPSTF guidelines and agreed to schedule her next follow up appointment today.  I, Marcille Blanco, am acting as transcriptionist for Starlyn Skeans, MD  I have reviewed the above documentation for accuracy and completeness, and I agree with the above. -Dennard Nip, MD

## 2017-11-20 ENCOUNTER — Ambulatory Visit (INDEPENDENT_AMBULATORY_CARE_PROVIDER_SITE_OTHER): Payer: 59 | Admitting: Family Medicine

## 2017-11-20 VITALS — BP 128/85 | HR 68 | Ht 66.0 in | Wt 168.0 lb

## 2017-11-20 DIAGNOSIS — E7849 Other hyperlipidemia: Secondary | ICD-10-CM | POA: Diagnosis not present

## 2017-11-20 DIAGNOSIS — Z6830 Body mass index (BMI) 30.0-30.9, adult: Secondary | ICD-10-CM

## 2017-11-20 DIAGNOSIS — E66811 Obesity, class 1: Secondary | ICD-10-CM

## 2017-11-20 DIAGNOSIS — E669 Obesity, unspecified: Secondary | ICD-10-CM | POA: Diagnosis not present

## 2017-11-20 DIAGNOSIS — E559 Vitamin D deficiency, unspecified: Secondary | ICD-10-CM | POA: Diagnosis not present

## 2017-11-20 DIAGNOSIS — E038 Other specified hypothyroidism: Secondary | ICD-10-CM | POA: Diagnosis not present

## 2017-11-20 DIAGNOSIS — Z9189 Other specified personal risk factors, not elsewhere classified: Secondary | ICD-10-CM

## 2017-11-20 DIAGNOSIS — R7303 Prediabetes: Secondary | ICD-10-CM | POA: Diagnosis not present

## 2017-11-20 MED ORDER — VITAMIN D (ERGOCALCIFEROL) 1.25 MG (50000 UNIT) PO CAPS: 50000.0000 [IU] | ORAL_CAPSULE | ORAL | 0 refills | Status: DC

## 2017-11-21 LAB — LIPID PANEL WITH LDL/HDL RATIO
Cholesterol, Total: 220 mg/dL — ABNORMAL HIGH (ref 100–199)
HDL: 34 mg/dL — AB (ref >39)
LDL Calculated: 148 mg/dL — ABNORMAL HIGH (ref 0–99)
LDL/HDL RATIO: 4.4 ratio — AB (ref 0.0–3.2)
TRIGLYCERIDES: 189 mg/dL — AB (ref 0–149)
VLDL Cholesterol Cal: 38 mg/dL (ref 5–40)

## 2017-11-21 LAB — COMPREHENSIVE METABOLIC PANEL
A/G RATIO: 1.7 (ref 1.2–2.2)
ALT: 14 IU/L (ref 0–32)
AST: 17 IU/L (ref 0–40)
Albumin: 4.5 g/dL (ref 3.6–4.8)
Alkaline Phosphatase: 97 IU/L (ref 39–117)
BUN/Creatinine Ratio: 17 (ref 12–28)
BUN: 14 mg/dL (ref 8–27)
Bilirubin Total: 0.4 mg/dL (ref 0.0–1.2)
CALCIUM: 10.2 mg/dL (ref 8.7–10.3)
CO2: 26 mmol/L (ref 20–29)
CREATININE: 0.84 mg/dL (ref 0.57–1.00)
Chloride: 96 mmol/L (ref 96–106)
GFR calc Af Amer: 87 mL/min/{1.73_m2} (ref >59)
GFR, EST NON AFRICAN AMERICAN: 76 mL/min/{1.73_m2} (ref >59)
Globulin, Total: 2.7 g/dL (ref 1.5–4.5)
Glucose: 101 mg/dL — ABNORMAL HIGH (ref 65–99)
POTASSIUM: 4.3 mmol/L (ref 3.5–5.2)
Sodium: 137 mmol/L (ref 134–144)
Total Protein: 7.2 g/dL (ref 6.0–8.5)

## 2017-11-21 LAB — HEMOGLOBIN A1C
Est. average glucose Bld gHb Est-mCnc: 120 mg/dL
Hgb A1c MFr Bld: 5.8 % — ABNORMAL HIGH (ref 4.8–5.6)

## 2017-11-21 LAB — T4, FREE: FREE T4: 1.03 ng/dL (ref 0.82–1.77)

## 2017-11-21 LAB — INSULIN, RANDOM: INSULIN: 8.5 u[IU]/mL (ref 2.6–24.9)

## 2017-11-21 LAB — T3: T3, Total: 96 ng/dL (ref 71–180)

## 2017-11-21 LAB — TSH: TSH: 5.73 u[IU]/mL — AB (ref 0.450–4.500)

## 2017-11-21 LAB — VITAMIN D 25 HYDROXY (VIT D DEFICIENCY, FRACTURES): Vit D, 25-Hydroxy: 50.2 ng/mL (ref 30.0–100.0)

## 2017-11-22 ENCOUNTER — Encounter (INDEPENDENT_AMBULATORY_CARE_PROVIDER_SITE_OTHER): Payer: Self-pay | Admitting: Family Medicine

## 2017-11-22 NOTE — Progress Notes (Signed)
Office: [phone]  /  Fax: [phone]   HPI:   Chief Complaint: OBESITY Rebecca Rogers is here to discuss her progress with her obesity treatment plan. She is on the Category 2 plan and is following her eating plan approximately 90 % of the time. She states she is exercising 0 minutes 0 times per week. Rebecca Rogers continues to do well with weight loss on Category 2 plan. She has questions about how to handle Thanksgiving eating.  Her weight is 168 lb (76.2 kg) today and has had a weight loss of 1 pound over a period of 2 weeks since her last visit. She has lost 12 lbs since starting treatment with [name].  Vitamin D Deficiency Rebecca Rogers has a diagnosis of vitamin D deficiency. She is stable on prescription Vit D and she is due for labs. She denies nausea, vomiting or muscle weakness.  Hyperlipidemia Rebecca Rogers has hyperlipidemia and she is attempting to control her cholesterol levels with intensive lifestyle modification including a low saturated fat diet, exercise and weight loss. She is due for labs and denies any chest pain, claudication or myalgias.  Pre-Diabetes Rebecca Rogers has a diagnosis of pre-diabetes based on her elevated Hgb A1c and was informed this puts her at greater risk of developing diabetes. She is not taking metformin currently and she is attempting to diet control. She notes decreased polyphagia and is doing well with diet prescription. She continues to work on diet and exercise to decrease risk of diabetes. She is due for labs and denies hypoglycemia.  At risk for diabetes Rebecca Rogers is at higher than average risk for developing diabetes due to her obesity and pre-diabetes. She currently denies polyuria or polydipsia.  Hypothyroidism Rebecca Rogers has a diagnosis of hypothyroidism. She is on Synthroid 50 mcg. She denies hot or cold intolerance or palpitations.  ALLERGIES: No Known Allergies  MEDICATIONS: Current Outpatient Medications on File Prior to Visit  Medication Sig Dispense Refill  .  levothyroxine (SYNTHROID) 50 MCG tablet Take 0.5 tablets (25 mcg total) by mouth daily before breakfast. 90 tablet 1  . nystatin-triamcinolone (MYCOLOG II) cream Apply 1 application topically 2 (two) times daily. 30 g 1  . sertraline (ZOLOFT) 50 MG tablet Take 1.5 tablets (75 mg total) by mouth daily. 1 & 1/2 tab daily 135 tablet 4  . triamterene-hydrochlorothiazide (MAXZIDE-25) 37.5-25 MG tablet Take 1 tablet by mouth daily. 90 tablet 4   No current facility-administered medications on file prior to visit.     PAST MEDICAL HISTORY: Past Medical History:  Diagnosis Date  . Adenomyosis   . Anxiety   . Cyst of right breast    4-5 oclock 1/2 cm  . HLD (hyperlipidemia)   . Hypertension    bordeline  . Hypothyroidism   . Increased BMI     PAST SURGICAL HISTORY: Past Surgical History:  Procedure Laterality Date  . ABDOMINAL HYSTERECTOMY     lsh- adenomysis and fibroids  . BREAST BIOPSY Right 2016   NEG  . COLONOSCOPY WITH PROPOFOL N/A 06/05/2015   Procedure: COLONOSCOPY WITH PROPOFOL;  Surgeon: Lucilla Lame, MD;  Location: [place];  Service: Endoscopy;  Laterality: N/A;  . COLONOSCOPY WITH PROPOFOL N/A 12/04/2015   Procedure: COLONOSCOPY WITH PROPOFOL;  Surgeon: Lucilla Lame, MD;  Location: [place];  Service: Endoscopy;  Laterality: N/A;  . LAPAROSCOPIC SALPINGOOPHERECTOMY     prophylactic  . POLYPECTOMY  06/05/2015   Procedure: POLYPECTOMY;  Surgeon: Lucilla Lame, MD;  Location: [place];  Service: Endoscopy;;  .  POLYPECTOMY  12/04/2015   Procedure: POLYPECTOMY;  Surgeon: Lucilla Lame, MD;  Location: [place];  Service: Endoscopy;;  . TONSILLECTOMY  1965    SOCIAL HISTORY: Social History   Tobacco Use  . Smoking status: Never Smoker  . Smokeless tobacco: Never Used  Substance Use Topics  . Alcohol use: No  . Drug use: No    FAMILY HISTORY: Family History  Problem Relation Age of Onset  . Ovarian cancer Mother   . Prostate cancer  Father   . Breast cancer Neg Hx   . Colon cancer Neg Hx   . Diabetes Neg Hx   . Heart disease Neg Hx     ROS: Review of Systems  Constitutional: Positive for weight loss.  Cardiovascular: Negative for chest pain, palpitations and claudication.  Gastrointestinal: Negative for nausea and vomiting.  Genitourinary: Negative for frequency.  Musculoskeletal: Negative for myalgias.       Negative muscle weakness  Endo/Heme/Allergies: Negative for polydipsia.       Positive polyphagia Negative hypoglycemia Negative hot/cold intolerance    PHYSICAL EXAM: Blood pressure 128/85, pulse 68, height 5\' 6"  (1.676 m), weight 168 lb (76.2 kg), SpO2 98 %. Body mass index is 27.12 kg/m. Physical Exam  Constitutional: She is oriented to person, place, and time. She appears well-developed and well-nourished.  Cardiovascular: Normal rate.  Pulmonary/Chest: Effort normal.  Musculoskeletal: Normal range of motion.  Neurological: She is oriented to person, place, and time.  Skin: Skin is warm and dry.  Psychiatric: She has a normal mood and affect. Her behavior is normal.  Vitals reviewed.   RECENT LABS AND TESTS: BMET    Component Value Date/Time   NA 137 11/20/2017 0939   K 4.3 11/20/2017 0939   CL 96 11/20/2017 0939   CO2 26 11/20/2017 0939   GLUCOSE 101 (H) 11/20/2017 0939   BUN 14 11/20/2017 0939   CREATININE 0.84 11/20/2017 0939   CALCIUM 10.2 11/20/2017 0939   GFRNONAA 76 11/20/2017 0939   GFRAA 87 11/20/2017 0939   Lab Results  Component Value Date   HGBA1C 5.8 (H) 11/20/2017   HGBA1C 5.9 (H) 08/15/2017   HGBA1C 5.9 (H) 06/22/2017   HGBA1C 5.8 (H) 04/08/2016   HGBA1C 6.0 (H) 04/21/2015   Lab Results  Component Value Date   INSULIN 8.5 11/20/2017   INSULIN 11.7 08/15/2017   CBC    Component Value Date/Time   WBC 6.3 08/15/2017 0940   RBC 4.72 08/15/2017 0940   HGB 14.2 08/15/2017 0940   HCT 43.7 08/15/2017 0940   PLT 365 06/03/2015 1045   MCV 93 08/15/2017 0940    MCH 30.1 08/15/2017 0940   MCHC 32.5 08/15/2017 0940   RDW 14.5 08/15/2017 0940   LYMPHSABS 2.2 08/15/2017 0940   EOSABS 0.3 08/15/2017 0940   BASOSABS 0.0 08/15/2017 0940   Iron/TIBC/Ferritin/ %Sat No results found for: IRON, TIBC, FERRITIN, IRONPCTSAT Lipid Panel     Component Value Date/Time   CHOL 220 (H) 11/20/2017 0939   TRIG 189 (H) 11/20/2017 0939   HDL 34 (L) 11/20/2017 0939   CHOLHDL 6.0 (H) 06/22/2017 0826   LDLCALC 148 (H) 11/20/2017 0939   Hepatic Function Panel     Component Value Date/Time   PROT 7.2 11/20/2017 0939   ALBUMIN 4.5 11/20/2017 0939   AST 17 11/20/2017 0939   ALT 14 11/20/2017 0939   ALKPHOS 97 11/20/2017 0939   BILITOT 0.4 11/20/2017 0939      Component Value Date/Time  TSH 5.730 (H) 11/20/2017 0939   TSH 3.810 08/15/2017 0940   TSH 2.760 06/22/2017 0826  Results for KARALEE, HAUTER (MRN [id number]) as of 11/22/2017 13:01  Ref. Range 08/15/2017 09:40  Vitamin D, 25-Hydroxy Latest Ref Range: 30.0 - 100.0 ng/mL 27.1 (L)    ASSESSMENT AND PLAN: Vitamin D deficiency - Plan: VITAMIN D 25 Hydroxy (Vit-D Deficiency, Fractures), Vitamin D, Ergocalciferol, (DRISDOL) 1.25 MG (50000 UT) CAPS capsule  Other hyperlipidemia  Prediabetes  Other specified hypothyroidism - Plan: T3, T4, free, TSH  At risk for diabetes mellitus  Class 1 obesity with serious comorbidity and body mass index (BMI) of 30.0 to 30.9 in adult, unspecified obesity type - beginning BMI >30  PLAN:  Vitamin D Deficiency Rebecca Rogers was informed that low vitamin D levels contributes to fatigue and are associated with obesity, breast, and colon cancer. Rebecca Rogers agrees to continue taking prescription Vit D @50 ,000 IU every week #4 and we will refill for 1 month. She will follow up for routine testing of vitamin D, at least 2-3 times per year. She was informed of the risk of over-replacement of vitamin D and agrees to not increase her dose unless she discusses this with [name] first. We will  check labs and Rebecca Rogers agrees to follow up with our clinic in 3 weeks.  Hyperlipidemia Rebecca Rogers was informed of the American Heart Association Guidelines emphasizing intensive lifestyle modifications as the first line treatment for hyperlipidemia. We discussed many lifestyle modifications today in depth, and Rebecca Rogers will continue to work on decreasing saturated fats such as fatty red meat, butter and many fried foods. She will also increase vegetables and lean protein in her diet and continue to work on diet, exercise, and weight loss efforts. We will check labs and Rebecca Rogers agrees to follow up with our clinic in 3 weeks.  Pre-Diabetes Rebecca Rogers will continue to work on weight loss, diet, exercise, and decreasing simple carbohydrates in her diet to help decrease the risk of diabetes. We dicussed metformin including benefits and risks. She was informed that eating too many simple carbohydrates or too many calories at one sitting increases the likelihood of GI side effects. Rebecca Rogers declined metformin for now and a prescription was not written today. We will check labs and Rebecca Rogers agrees to follow up with our clinic in 3 weeks as directed to monitor her progress.  Diabetes risk counselling Rebecca Rogers was given extended (15 minutes) diabetes prevention counseling today. She is 62 y.o. female and has risk factors for diabetes including obesity and pre-diabetes. We discussed intensive lifestyle modifications today with an emphasis on weight loss as well as increasing exercise and decreasing simple carbohydrates in her diet.  Hypothyroidism Rebecca Rogers was informed of the importance of good thyroid control to help with weight loss efforts. She was also informed that supertheraputic thyroid levels are dangerous and will not improve weight loss results. We will check labs and Rebecca Rogers agrees to follow up with our clinic in 3 weeks.  Obesity Rebecca Rogers is currently in the action stage of change. As such, her goal is to continue with  weight loss efforts She has agreed to follow the Category 2 plan Rebecca Rogers has been instructed to work up to a goal of 150 minutes of combined cardio and strengthening exercise per week for weight loss and overall health benefits. We discussed the following Behavioral Modification Strategies today: increasing lean protein intake, work on meal planning and easy cooking plans, holiday eating strategies  and emotional eating strategies   Rebecca Rogers has agreed  to follow up with our clinic in 3 weeks. She was informed of the importance of frequent follow up visits to maximize her success with intensive lifestyle modifications for her multiple health conditions.   OBESITY BEHAVIORAL INTERVENTION VISIT  Today's visit was # 7   Starting weight: 180 lbs Starting date: 08/15/17 Today's weight : 168 lbs  Today's date: 11/20/2017 Total lbs lost to date: 12    ASK: We discussed the diagnosis of obesity with Rebecca Rogers today and Rebecca Rogers agreed to give [name] permission to discuss obesity behavioral modification therapy today.  ASSESS: Rebecca Rogers has the diagnosis of obesity and her BMI today is 27.13 Rebecca Rogers is in the action stage of change   ADVISE: Rebecca Rogers was educated on the multiple health risks of obesity as well as the benefit of weight loss to improve her health. She was advised of the need for long term treatment and the importance of lifestyle modifications to improve her current health and to decrease her risk of future health problems.  AGREE: Multiple dietary modification options and treatment options were discussed and  Jayonna agreed to follow the recommendations documented in the above note.  ARRANGE: Honesti was educated on the importance of frequent visits to treat obesity as outlined per CMS and USPSTF guidelines and agreed to schedule her next follow up appointment today.  I, Trixie Dredge, am acting as transcriptionist for Dennard Nip, MD  I have reviewed the above documentation for  accuracy and completeness, and I agree with the above. -Dennard Nip, MD

## 2017-12-06 ENCOUNTER — Other Ambulatory Visit (HOSPITAL_COMMUNITY)
Admission: RE | Admit: 2017-12-06 | Discharge: 2017-12-06 | Disposition: A | Discharge status: Home / Self Care | Payer: 59 | Source: Ambulatory Visit | Attending: Obstetrics and Gynecology | Admitting: Obstetrics and Gynecology

## 2017-12-06 ENCOUNTER — Encounter: Payer: Self-pay | Admitting: Obstetrics and Gynecology

## 2017-12-06 ENCOUNTER — Ambulatory Visit (INDEPENDENT_AMBULATORY_CARE_PROVIDER_SITE_OTHER): Payer: 59 | Admitting: Obstetrics and Gynecology

## 2017-12-06 VITALS — BP 124/80 | HR 82 | Ht 66.0 in | Wt 170.6 lb

## 2017-12-06 DIAGNOSIS — B977 Papillomavirus as the cause of diseases classified elsewhere: Secondary | ICD-10-CM | POA: Diagnosis not present

## 2017-12-06 DIAGNOSIS — N870 Mild cervical dysplasia: Secondary | ICD-10-CM | POA: Diagnosis not present

## 2017-12-06 DIAGNOSIS — Z90711 Acquired absence of uterus with remaining cervical stump: Secondary | ICD-10-CM

## 2017-12-06 DIAGNOSIS — Z124 Encounter for screening for malignant neoplasm of cervix: Secondary | ICD-10-CM

## 2017-12-06 DIAGNOSIS — R638 Other symptoms and signs concerning food and fluid intake: Secondary | ICD-10-CM

## 2017-12-06 NOTE — Patient Instructions (Signed)
1.  Pap smear is done today. 2.  Return in 6 months for annual exam as scheduled.

## 2017-12-06 NOTE — Progress Notes (Signed)
Chief complaint: 1.  Pap smear 2.  History of CIN-1  Emilija presents today for Pap smear.  History of CIN-1. Patient reports no major interval health issues.  She is involved with weight loss program and has lost 12 pounds today.  She feels great at this time.  She is pleased with her new job and is with markedly reduced life stressors.  Pap smear history: 05/23/2017 colposcopy without biopsies; adequate-normal-appearing cervix and SCJ is visualized 11/08/2016 colposcopy with colposcopic directed biopsies; inadequate; acetowhite epithelium at 10:00  Pap/HPV ASCUS/positive (not HPV 16, not HPV 18)  ECC-negative  Cervix 10:00-CIN-1 05/04/2016 colposcopy with colposcopic directed biopsies; Inadequate; acetowhite epithelium between 10 and 11:00  ECC-negative  Cervix 10:00-CIN-1 04/07/2016 Pap smear/HPV negative/positive  Past medical history, past surgeries, problem list, medications, and allergies are reviewed  OBJECTIVE: BP 124/80   Pulse 82   Ht 5\' 6"  (1.676 m)   Wt 170 lb 9.6 oz (77.4 kg)   BMI 27.54 kg/m  Pleasant female no acute distress.  Alert and oriented.  Affect appropriate. Abdomen: Soft, nontender without organomegaly Pelvic exam: External genitalia-normal BUS-normal Vagina-healthy; no significant discharge Cervix-no lesions; no cervical motion tenderness Uterus-surgically absent   ASSESSMENT: 1.  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  Weight loss, desired, successful  PLAN: 1.  Pap smear/HPV; if high-grade dysplasia is identified, repeat colposcopy will be recommended. 2.  Return in 6 months for annual physical-Dr. Margie Ege, MD  Note: This dictation was prepared with Dragon dictation along with smaller phrase technology. Any transcriptional errors that result from this process are unintentional.

## 2017-12-08 ENCOUNTER — Other Ambulatory Visit: Payer: 59

## 2017-12-11 LAB — CYTOLOGY - PAP
Adequacy: ABSENT — AB
HPV: DETECTED — AB

## 2017-12-12 ENCOUNTER — Encounter (INDEPENDENT_AMBULATORY_CARE_PROVIDER_SITE_OTHER): Payer: Self-pay | Admitting: Family Medicine

## 2017-12-12 ENCOUNTER — Ambulatory Visit (INDEPENDENT_AMBULATORY_CARE_PROVIDER_SITE_OTHER): Payer: 59 | Admitting: Family Medicine

## 2017-12-12 VITALS — BP 116/75 | HR 74 | Temp 97.9°F | Ht 66.0 in | Wt 166.0 lb

## 2017-12-12 DIAGNOSIS — Z6830 Body mass index (BMI) 30.0-30.9, adult: Secondary | ICD-10-CM

## 2017-12-12 DIAGNOSIS — E669 Obesity, unspecified: Secondary | ICD-10-CM

## 2017-12-12 DIAGNOSIS — R7303 Prediabetes: Secondary | ICD-10-CM

## 2017-12-12 DIAGNOSIS — E559 Vitamin D deficiency, unspecified: Secondary | ICD-10-CM

## 2017-12-12 DIAGNOSIS — Z9189 Other specified personal risk factors, not elsewhere classified: Secondary | ICD-10-CM | POA: Diagnosis not present

## 2017-12-12 MED ORDER — VITAMIN D (ERGOCALCIFEROL) 1.25 MG (50000 UNIT) PO CAPS: 50000.0000 [IU] | ORAL_CAPSULE | ORAL | 0 refills | Status: DC

## 2017-12-12 NOTE — Progress Notes (Signed)
Office: [phone]  /  Fax: [phone]   HPI:   Chief Complaint: OBESITY Rebecca Rogers is here to discuss her progress with her obesity treatment plan. She is on the Category 2 plan and is following her eating plan approximately 75 % of the time. She states she is exercising 0 minutes 0 times per week. Rebecca Rogers continues to do well with weight loss on her Category 2 plan. Her hunger is controlled and she doesn't feel deprived. She is doing well avoiding temptations and she portion controls when she does have a little. Her weight is 166 lb (75.3 kg) today and has had a weight loss of 2 pounds over a period of 3 weeks since her last visit. She has lost 14 lbs since starting treatment with [name].  Vitamin D Deficiency Analysse has a diagnosis of vitamin D deficiency. She is stable on prescription Vit D and level is now at goal. She denies nausea, vomiting or muscle weakness.  Pre-Diabetes Rebecca Rogers has a diagnosis of pre-diabetes based on her elevated Hgb A1c and was informed this puts her at greater risk of developing diabetes. Her A1c has improved with diet and exercise. She is not on metformin and denies hypoglycemia.   At risk for diabetes Rebecca Rogers is at higher than average risk for developing diabetes due to her obesity and pre-diabetes. She currently denies polyuria or polydipsia.  ALLERGIES: No Known Allergies  MEDICATIONS: Current Outpatient Medications on File Prior to Visit  Medication Sig Dispense Refill  . levothyroxine (SYNTHROID) 50 MCG tablet Take 0.5 tablets (25 mcg total) by mouth daily before breakfast. 90 tablet 1  . nystatin-triamcinolone (MYCOLOG II) cream Apply 1 application topically 2 (two) times daily. 30 g 1  . sertraline (ZOLOFT) 50 MG tablet Take 1.5 tablets (75 mg total) by mouth daily. 1 & 1/2 tab daily 135 tablet 4  . triamterene-hydrochlorothiazide (MAXZIDE-25) 37.5-25 MG tablet Take 1 tablet by mouth daily. 90 tablet 4   No current facility-administered medications on  file prior to visit.     PAST MEDICAL HISTORY: Past Medical History:  Diagnosis Date  . Adenomyosis   . Anxiety   . Cyst of right breast    4-5 oclock 1/2 cm  . HLD (hyperlipidemia)   . Hypertension    bordeline  . Hypothyroidism   . Increased BMI     PAST SURGICAL HISTORY: Past Surgical History:  Procedure Laterality Date  . ABDOMINAL HYSTERECTOMY     lsh- adenomysis and fibroids  . BREAST BIOPSY Right 2016   NEG  . COLONOSCOPY WITH PROPOFOL N/A 06/05/2015   Procedure: COLONOSCOPY WITH PROPOFOL;  Surgeon: Lucilla Lame, MD;  Location: [place];  Service: Endoscopy;  Laterality: N/A;  . COLONOSCOPY WITH PROPOFOL N/A 12/04/2015   Procedure: COLONOSCOPY WITH PROPOFOL;  Surgeon: Lucilla Lame, MD;  Location: [place];  Service: Endoscopy;  Laterality: N/A;  . LAPAROSCOPIC SALPINGOOPHERECTOMY     prophylactic  . POLYPECTOMY  06/05/2015   Procedure: POLYPECTOMY;  Surgeon: Lucilla Lame, MD;  Location: [place];  Service: Endoscopy;;  . POLYPECTOMY  12/04/2015   Procedure: POLYPECTOMY;  Surgeon: Lucilla Lame, MD;  Location: [place];  Service: Endoscopy;;  . TONSILLECTOMY  1965    SOCIAL HISTORY: Social History   Tobacco Use  . Smoking status: Never Smoker  . Smokeless tobacco: Never Used  Substance Use Topics  . Alcohol use: No  . Drug use: No    FAMILY HISTORY: Family History  Problem Relation Age of Onset  .  Ovarian cancer Mother   . Prostate cancer Father   . Breast cancer Neg Hx   . Colon cancer Neg Hx   . Diabetes Neg Hx   . Heart disease Neg Hx     ROS: Review of Systems  Constitutional: Positive for weight loss.  Gastrointestinal: Negative for nausea and vomiting.  Genitourinary: Negative for frequency.  Musculoskeletal:       Negative muscle weakness  Endo/Heme/Allergies: Negative for polydipsia.       Negative hypoglycemia    PHYSICAL EXAM: Blood pressure 116/75, pulse 74, temperature 97.9 F (36.6 C),  temperature source Oral, height 5\' 6"  (1.676 m), weight 166 lb (75.3 kg), SpO2 98 %. Body mass index is 26.79 kg/m. Physical Exam  Constitutional: She is oriented to person, place, and time. She appears well-developed and well-nourished.  Cardiovascular: Normal rate.  Pulmonary/Chest: Effort normal.  Musculoskeletal: Normal range of motion.  Neurological: She is oriented to person, place, and time.  Skin: Skin is warm and dry.  Psychiatric: She has a normal mood and affect. Her behavior is normal.  Vitals reviewed.   RECENT LABS AND TESTS: BMET    Component Value Date/Time   NA 137 11/20/2017 0939   K 4.3 11/20/2017 0939   CL 96 11/20/2017 0939   CO2 26 11/20/2017 0939   GLUCOSE 101 (H) 11/20/2017 0939   BUN 14 11/20/2017 0939   CREATININE 0.84 11/20/2017 0939   CALCIUM 10.2 11/20/2017 0939   GFRNONAA 76 11/20/2017 0939   GFRAA 87 11/20/2017 0939   Lab Results  Component Value Date   HGBA1C 5.8 (H) 11/20/2017   HGBA1C 5.9 (H) 08/15/2017   HGBA1C 5.9 (H) 06/22/2017   HGBA1C 5.8 (H) 04/08/2016   HGBA1C 6.0 (H) 04/21/2015   Lab Results  Component Value Date   INSULIN 8.5 11/20/2017   INSULIN 11.7 08/15/2017   CBC    Component Value Date/Time   WBC 6.3 08/15/2017 0940   RBC 4.72 08/15/2017 0940   HGB 14.2 08/15/2017 0940   HCT 43.7 08/15/2017 0940   PLT 365 06/03/2015 1045   MCV 93 08/15/2017 0940   MCH 30.1 08/15/2017 0940   MCHC 32.5 08/15/2017 0940   RDW 14.5 08/15/2017 0940   LYMPHSABS 2.2 08/15/2017 0940   EOSABS 0.3 08/15/2017 0940   BASOSABS 0.0 08/15/2017 0940   Iron/TIBC/Ferritin/ %Sat No results found for: IRON, TIBC, FERRITIN, IRONPCTSAT Lipid Panel     Component Value Date/Time   CHOL 220 (H) 11/20/2017 0939   TRIG 189 (H) 11/20/2017 0939   HDL 34 (L) 11/20/2017 0939   CHOLHDL 6.0 (H) 06/22/2017 0826   LDLCALC 148 (H) 11/20/2017 0939   Hepatic Function Panel     Component Value Date/Time   PROT 7.2 11/20/2017 0939   ALBUMIN 4.5  11/20/2017 0939   AST 17 11/20/2017 0939   ALT 14 11/20/2017 0939   ALKPHOS 97 11/20/2017 0939   BILITOT 0.4 11/20/2017 0939      Component Value Date/Time   TSH 5.730 (H) 11/20/2017 0939   TSH 3.810 08/15/2017 0940   TSH 2.760 06/22/2017 0826  Results for SYANNE, LOONEY (MRN [id number]) as of 12/12/2017 17:04  Ref. Range 11/20/2017 09:39  Vitamin D, 25-Hydroxy Latest Ref Range: 30.0 - 100.0 ng/mL 50.2    ASSESSMENT AND PLAN: Vitamin D deficiency - Plan: Vitamin D, Ergocalciferol, (DRISDOL) 1.25 MG (50000 UT) CAPS capsule  Prediabetes  At risk for diabetes mellitus  Class 1 obesity with serious comorbidity and body mass index (  BMI) of 30.0 to 30.9 in adult, unspecified obesity type - Starting BMI greater then 30  PLAN:  Vitamin D Deficiency Swara was informed that low vitamin D levels contributes to fatigue and are associated with obesity, breast, and colon cancer. Rashi agrees to continue taking prescription Vit D @50 ,000 IU every week #4 and we will refill for 1 month. She will follow up for routine testing of vitamin D, at least 2-3 times per year. She was informed of the risk of over-replacement of vitamin D and agrees to not increase her dose unless she discusses this with [name] first. Bao agrees to follow up with our clinic in 3 to 4 weeks.  Pre-Diabetes Acelyn will continue to work on weight loss, diet, exercise, and decreasing simple carbohydrates in her diet to help decrease the risk of diabetes. We dicussed metformin including benefits and risks. She was informed that eating too many simple carbohydrates or too many calories at one sitting increases the likelihood of GI side effects. Tahara declined metformin for now and a prescription was not written today. We will recheck labs in 3 months. Bridgitt agrees to follow up with our clinic in 3 to 4 weeks as directed to monitor her progress.  Diabetes risk counselling Ireoluwa was given extended (15 minutes) diabetes prevention  counseling today. She is 62 y.o. female and has risk factors for diabetes including obesity and pre-diabetes. We discussed intensive lifestyle modifications today with an emphasis on weight loss as well as increasing exercise and decreasing simple carbohydrates in her diet.  Obesity Magin is currently in the action stage of change. As such, her goal is to continue with weight loss efforts She has agreed to follow the Category 2 plan Mashelle has been instructed to work up to a goal of 150 minutes of combined cardio and strengthening exercise per week for weight loss and overall health benefits. We discussed the following Behavioral Modification Strategies today: increasing lean protein intake, work on meal planning and easy cooking plans, holiday eating strategies, and celebration eating strategies   Charrisse has agreed to follow up with our clinic in 3 to 4 weeks. She was informed of the importance of frequent follow up visits to maximize her success with intensive lifestyle modifications for her multiple health conditions.   OBESITY BEHAVIORAL INTERVENTION VISIT  Today's visit was # 8   Starting weight: 180 lbs Starting date: 08/15/17 Today's weight : 166 lbs Today's date: 12/12/2017 Total lbs lost to date: 14    ASK: We discussed the diagnosis of obesity with Deland Pretty today and Auna agreed to give [name] permission to discuss obesity behavioral modification therapy today.  ASSESS: Airyn has the diagnosis of obesity and her BMI today is 26.81 Marli is in the action stage of change   ADVISE: Nandana was educated on the multiple health risks of obesity as well as the benefit of weight loss to improve her health. She was advised of the need for long term treatment and the importance of lifestyle modifications to improve her current health and to decrease her risk of future health problems.  AGREE: Multiple dietary modification options and treatment options were discussed and   Sebrena agreed to follow the recommendations documented in the above note.  ARRANGE: Korynn was educated on the importance of frequent visits to treat obesity as outlined per CMS and USPSTF guidelines and agreed to schedule her next follow up appointment today.  Wilhemena Durie, am acting as transcriptionist for Dennard Nip, MD  I have reviewed the above documentation for accuracy and completeness, and I agree with the above. -Dennard Nip, MD

## 2017-12-15 ENCOUNTER — Ambulatory Visit
Admission: RE | Admit: 2017-12-15 | Discharge: 2017-12-15 | Disposition: A | Discharge status: Home / Self Care | Payer: 59 | Source: Ambulatory Visit | Attending: Obstetrics and Gynecology | Admitting: Obstetrics and Gynecology

## 2017-12-15 DIAGNOSIS — N6310 Unspecified lump in the right breast, unspecified quadrant: Secondary | ICD-10-CM | POA: Insufficient documentation

## 2017-12-15 DIAGNOSIS — N6011 Diffuse cystic mastopathy of right breast: Secondary | ICD-10-CM | POA: Diagnosis not present

## 2018-01-05 DIAGNOSIS — L280 Lichen simplex chronicus: Secondary | ICD-10-CM | POA: Diagnosis not present

## 2018-01-05 DIAGNOSIS — L304 Erythema intertrigo: Secondary | ICD-10-CM | POA: Diagnosis not present

## 2018-01-10 ENCOUNTER — Ambulatory Visit (INDEPENDENT_AMBULATORY_CARE_PROVIDER_SITE_OTHER): Payer: 59 | Admitting: Family Medicine

## 2018-01-10 ENCOUNTER — Encounter (INDEPENDENT_AMBULATORY_CARE_PROVIDER_SITE_OTHER): Payer: Self-pay | Admitting: Family Medicine

## 2018-01-10 VITALS — BP 113/74 | HR 79 | Ht 66.0 in | Wt 164.0 lb

## 2018-01-10 DIAGNOSIS — E559 Vitamin D deficiency, unspecified: Secondary | ICD-10-CM | POA: Diagnosis not present

## 2018-01-10 DIAGNOSIS — E669 Obesity, unspecified: Secondary | ICD-10-CM

## 2018-01-10 DIAGNOSIS — Z9189 Other specified personal risk factors, not elsewhere classified: Secondary | ICD-10-CM | POA: Diagnosis not present

## 2018-01-10 DIAGNOSIS — Z6830 Body mass index (BMI) 30.0-30.9, adult: Secondary | ICD-10-CM | POA: Diagnosis not present

## 2018-01-10 MED ORDER — VITAMIN D (ERGOCALCIFEROL) 1.25 MG (50000 UNIT) PO CAPS: 50000.0000 [IU] | ORAL_CAPSULE | ORAL | 0 refills | Status: DC

## 2018-01-11 DIAGNOSIS — E559 Vitamin D deficiency, unspecified: Secondary | ICD-10-CM | POA: Insufficient documentation

## 2018-01-11 NOTE — Progress Notes (Signed)
Office: [phone]  /  Fax: [phone]   HPI:   Chief Complaint: OBESITY Rebecca Rogers is here to discuss her progress with her obesity treatment plan. Rebecca Rogers is on the Category 2 plan and is following her eating plan approximately 70 % of the time. Rebecca Rogers states Rebecca Rogers is exercising 0 minutes 0 times per week. Shakyia has done well with weight loss even over the holidays. Rebecca Rogers is getting close to her goal weight and wants to discuss maintenance strategies.  Her weight is 164 lb (74.4 kg) today and has had a weight loss of 2 pounds over a period of 4 weeks since her last visit. Rebecca Rogers has lost 16 lbs since starting treatment with [name].  Vitamin D deficiency Rebecca Rogers has a diagnosis of vitamin D deficiency. Rebecca Rogers is currently stable on vit D and is now at goal. Rebecca Rogers denies nausea, vomiting, or muscle weakness.  At risk for osteopenia and osteoporosis Rebecca Rogers is at higher risk of osteopenia and osteoporosis due to vitamin D deficiency.   ASSESSMENT AND PLAN:  Vitamin D deficiency - Plan: Vitamin D, Ergocalciferol, (DRISDOL) 1.25 MG (50000 UT) CAPS capsule  At risk for osteoporosis  Class 1 obesity with serious comorbidity and body mass index (BMI) of 30.0 to 30.9 in adult, unspecified obesity type - Starting BMI greater then 30  PLAN:  Vitamin D Deficiency Rebecca Rogers was informed that low vitamin D levels contributes to fatigue and are associated with obesity, breast, and colon cancer. Rebecca Rogers agrees to continue to take prescription Vit D @50 ,000 IU every week #4 with no refills and will follow up for routine testing of vitamin D, at least 2-3 times per year. Rebecca Rogers was informed of the risk of over-replacement of vitamin D and agrees to not increase her dose unless Rebecca Rogers discusses this with [name] first. Rebecca Rogers agrees to follow up in 4 weeks.  At risk for osteopenia and osteoporosis Rebecca Rogers was given extended (15 minutes) osteoporosis prevention counseling today. Rebecca Rogers is at risk for osteopenia and osteoporosis due to her  vitamin D deficiency. Rebecca Rogers was encouraged to take her vitamin D and follow her higher calcium diet and increase strengthening exercise to help strengthen her bones and decrease her risk of osteopenia and osteoporosis.  Obesity Rebecca Rogers is currently in the action stage of change. As such, her goal is to continue with weight loss efforts. Rebecca Rogers has agreed to follow the Category 2 plan and to keep a food journal with 400 to 500 calories and 35+ grams of protein for supper. Rebecca Rogers has been instructed to work up to a goal of 150 minutes of combined cardio and strengthening exercise per week for weight loss and overall health benefits. We discussed the following Behavioral Modification Strategies today: increasing lean protein intake, work on meal planning and easy cooking plans, and keeping a strict food journal.  Rebecca Rogers has agreed to follow up with our clinic in 4 weeks. Rebecca Rogers was informed of the importance of frequent follow up visits to maximize her success with intensive lifestyle modifications for her multiple health conditions.  ALLERGIES: No Known Allergies  MEDICATIONS: Current Outpatient Medications on File Prior to Visit  Medication Sig Dispense Refill  . levothyroxine (SYNTHROID) 50 MCG tablet Take 0.5 tablets (25 mcg total) by mouth daily before breakfast. 90 tablet 1  . nystatin-triamcinolone (MYCOLOG II) cream Apply 1 application topically 2 (two) times daily. 30 g 1  . sertraline (ZOLOFT) 50 MG tablet Take 1.5 tablets (75 mg total) by mouth daily. 1 & 1/2 tab daily  135 tablet 4  . triamterene-hydrochlorothiazide (MAXZIDE-25) 37.5-25 MG tablet Take 1 tablet by mouth daily. 90 tablet 4   No current facility-administered medications on file prior to visit.     PAST MEDICAL HISTORY: Past Medical History:  Diagnosis Date  . Adenomyosis   . Anxiety   . Cyst of right breast    4-5 oclock 1/2 cm  . HLD (hyperlipidemia)   . Hypertension    bordeline  . Hypothyroidism   . Increased BMI       PAST SURGICAL HISTORY: Past Surgical History:  Procedure Laterality Date  . ABDOMINAL HYSTERECTOMY     lsh- adenomysis and fibroids  . BREAST BIOPSY Right 2016   NEG  . COLONOSCOPY WITH PROPOFOL N/A 06/05/2015   Procedure: COLONOSCOPY WITH PROPOFOL;  Surgeon: Lucilla Lame, MD;  Location: [facility];  Service: Endoscopy;  Laterality: N/A;  . COLONOSCOPY WITH PROPOFOL N/A 12/04/2015   Procedure: COLONOSCOPY WITH PROPOFOL;  Surgeon: Lucilla Lame, MD;  Location: [place];  Service: Endoscopy;  Laterality: N/A;  . LAPAROSCOPIC SALPINGOOPHERECTOMY     prophylactic  . POLYPECTOMY  06/05/2015   Procedure: POLYPECTOMY;  Surgeon: Lucilla Lame, MD;  Location: [place];  Service: Endoscopy;;  . POLYPECTOMY  12/04/2015   Procedure: POLYPECTOMY;  Surgeon: Lucilla Lame, MD;  Location: [place];  Service: Endoscopy;;  . TONSILLECTOMY  1965    SOCIAL HISTORY: Social History   Tobacco Use  . Smoking status: Never Smoker  . Smokeless tobacco: Never Used  Substance Use Topics  . Alcohol use: No  . Drug use: No    FAMILY HISTORY: Family History  Problem Relation Age of Onset  . Ovarian cancer Mother   . Prostate cancer Father   . Breast cancer Neg Hx   . Colon cancer Neg Hx   . Diabetes Neg Hx   . Heart disease Neg Hx     ROS: Review of Systems  Constitutional: Positive for weight loss.  Gastrointestinal: Negative for nausea and vomiting.  Musculoskeletal:       Negative for muscle weakness.    PHYSICAL EXAM: Blood pressure 113/74, pulse 79, height 5\' 6"  (1.676 m), weight 164 lb (74.4 kg), SpO2 97 %. Body mass index is 26.47 kg/m. Physical Exam Vitals signs reviewed.  Constitutional:      Appearance: Normal appearance. Rebecca Rogers is obese.  Cardiovascular:     Rate and Rhythm: Normal rate.  Pulmonary:     Effort: Pulmonary effort is normal.  Musculoskeletal: Normal range of motion.  Skin:    General: Skin is warm and dry.  Neurological:      Mental Status: Rebecca Rogers is alert and oriented to person, place, and time.  Psychiatric:        Mood and Affect: Mood normal.        Behavior: Behavior normal.     RECENT LABS AND TESTS: BMET    Component Value Date/Time   NA 137 11/20/2017 0939   K 4.3 11/20/2017 0939   CL 96 11/20/2017 0939   CO2 26 11/20/2017 0939   GLUCOSE 101 (H) 11/20/2017 0939   BUN 14 11/20/2017 0939   CREATININE 0.84 11/20/2017 0939   CALCIUM 10.2 11/20/2017 0939   GFRNONAA 76 11/20/2017 0939   GFRAA 87 11/20/2017 0939   Lab Results  Component Value Date   HGBA1C 5.8 (H) 11/20/2017   HGBA1C 5.9 (H) 08/15/2017   HGBA1C 5.9 (H) 06/22/2017   HGBA1C 5.8 (H) 04/08/2016   HGBA1C 6.0 (H)  04/21/2015   Lab Results  Component Value Date   INSULIN 8.5 11/20/2017   INSULIN 11.7 08/15/2017   CBC    Component Value Date/Time   WBC 6.3 08/15/2017 0940   RBC 4.72 08/15/2017 0940   HGB 14.2 08/15/2017 0940   HCT 43.7 08/15/2017 0940   PLT 365 06/03/2015 1045   MCV 93 08/15/2017 0940   MCH 30.1 08/15/2017 0940   MCHC 32.5 08/15/2017 0940   RDW 14.5 08/15/2017 0940   LYMPHSABS 2.2 08/15/2017 0940   EOSABS 0.3 08/15/2017 0940   BASOSABS 0.0 08/15/2017 0940   Iron/TIBC/Ferritin/ %Sat No results found for: IRON, TIBC, FERRITIN, IRONPCTSAT Lipid Panel     Component Value Date/Time   CHOL 220 (H) 11/20/2017 0939   TRIG 189 (H) 11/20/2017 0939   HDL 34 (L) 11/20/2017 0939   CHOLHDL 6.0 (H) 06/22/2017 0826   LDLCALC 148 (H) 11/20/2017 0939   Hepatic Function Panel     Component Value Date/Time   PROT 7.2 11/20/2017 0939   ALBUMIN 4.5 11/20/2017 0939   AST 17 11/20/2017 0939   ALT 14 11/20/2017 0939   ALKPHOS 97 11/20/2017 0939   BILITOT 0.4 11/20/2017 0939      Component Value Date/Time   TSH 5.730 (H) 11/20/2017 0939   TSH 3.810 08/15/2017 0940   TSH 2.760 06/22/2017 0826   Results for TIFANIE, GARDINER (MRN [id number]) as of 01/11/2018 09:56  Ref. Range 11/20/2017 09:39  Vitamin D, 25-Hydroxy  Latest Ref Range: 30.0 - 100.0 ng/mL 50.2    OBESITY BEHAVIORAL INTERVENTION VISIT  Today's visit was # 9   Starting weight: 180 lbs Starting date: 08/15/17 Today's weight : Weight: 164 lb (74.4 kg)  Today's date: 01/10/2018 Total lbs lost to date: 16  ASK: We discussed the diagnosis of obesity with Rebecca Rogers today and Rebecca Rogers agreed to give [name] permission to discuss obesity behavioral modification therapy today.  ASSESS: Otelia has the diagnosis of obesity and her BMI today is 26.4. Edilia is in the action stage of change.   ADVISE: Inika was educated on the multiple health risks of obesity as well as the benefit of weight loss to improve her health. Rebecca Rogers was advised of the need for long term treatment and the importance of lifestyle modifications to improve her current health and to decrease her risk of future health problems.  AGREE: Multiple dietary modification options and treatment options were discussed and Helayna agreed to follow the recommendations documented in the above note.  ARRANGE: Cornelia was educated on the importance of frequent visits to treat obesity as outlined per CMS and USPSTF guidelines and agreed to schedule her next follow up appointment today.  I, Marcille Blanco, am acting as transcriptionist for Starlyn Skeans, MD  I have reviewed the above documentation for accuracy and completeness, and I agree with the above. -Dennard Nip, MD

## 2018-01-19 DIAGNOSIS — L304 Erythema intertrigo: Secondary | ICD-10-CM | POA: Diagnosis not present

## 2018-02-07 ENCOUNTER — Encounter (INDEPENDENT_AMBULATORY_CARE_PROVIDER_SITE_OTHER): Payer: Self-pay | Admitting: Family Medicine

## 2018-02-07 ENCOUNTER — Ambulatory Visit (INDEPENDENT_AMBULATORY_CARE_PROVIDER_SITE_OTHER): Payer: Commercial Managed Care - PPO | Admitting: Family Medicine

## 2018-02-07 VITALS — BP 122/72 | HR 65 | Ht 66.0 in | Wt 163.0 lb

## 2018-02-07 DIAGNOSIS — E559 Vitamin D deficiency, unspecified: Secondary | ICD-10-CM | POA: Diagnosis not present

## 2018-02-07 DIAGNOSIS — Z9189 Other specified personal risk factors, not elsewhere classified: Secondary | ICD-10-CM | POA: Diagnosis not present

## 2018-02-07 DIAGNOSIS — Z6830 Body mass index (BMI) 30.0-30.9, adult: Secondary | ICD-10-CM

## 2018-02-07 DIAGNOSIS — E669 Obesity, unspecified: Secondary | ICD-10-CM | POA: Diagnosis not present

## 2018-02-07 MED ORDER — VITAMIN D (ERGOCALCIFEROL) 1.25 MG (50000 UNIT) PO CAPS: 50000.0000 [IU] | ORAL_CAPSULE | ORAL | 0 refills | Status: DC

## 2018-02-07 NOTE — Progress Notes (Signed)
Office: [phone]  /  Fax: [phone]   HPI:   Chief Complaint: OBESITY Rebecca Rogers is here to discuss her progress with her obesity treatment plan. She is keeping a food journal with 400 to 500 calories and 35 grams of protein for supper and following the Category 2 plan and is following her eating plan approximately 80 % of the time. She states she is exercising 0 minutes 0 times per week. Rebecca Rogers continues to lose weight on the Category 2 plan, but is still struggling with the meal plan, especially for dinner. She sometimes skips meals and is not exercising yet.  Her weight is 163 lb (73.9 kg) today and has had a weight loss of 1 pound over a period of 4 weeks since her last visit. She has lost 17 lbs since starting treatment with [name].  Vitamin D deficiency Rebecca Rogers has a diagnosis of vitamin D deficiency. She is currently stable on vit D and denies nausea, vomiting, or muscle weakness.  At risk for osteopenia and osteoporosis Rebecca Rogers is at higher risk of osteopenia and osteoporosis due to vitamin D deficiency.   ASSESSMENT AND PLAN:  Vitamin D deficiency - Plan: Vitamin D, Ergocalciferol, (DRISDOL) 1.25 MG (50000 UT) CAPS capsule  At risk for osteoporosis  Class 1 obesity with serious comorbidity and body mass index (BMI) of 30.0 to 30.9 in adult, unspecified obesity type - Starting BMI greater then 30  PLAN:  Vitamin D Deficiency Rebecca Rogers was informed that low vitamin D levels contributes to fatigue and are associated with obesity, breast, and colon cancer. She agrees to continue to take prescription Vit D @50 ,000 IU every week #4 with no refills and will follow up for routine testing of vitamin D, at least 2-3 times per year. She was informed of the risk of over-replacement of vitamin D and agrees to not increase her dose unless she discusses this with [name] first. Rebecca Rogers agrees to follow up in 4 weeks as directed.  At risk for osteopenia and osteoporosis Rebecca Rogers was given extended (15  minutes) osteoporosis prevention counseling today. Rebecca Rogers is at risk for osteopenia and osteoporosis due to her vitamin D deficiency. She was encouraged to take her vitamin D and follow her higher calcium diet and increase strengthening exercise to help strengthen her bones and decrease her risk of osteopenia and osteoporosis.  Obesity Rebecca Rogers is currently in the action stage of change. As such, her goal is to continue with weight loss efforts. She has agreed to keep a food journal with 400 to 500 calories and 35 grams of protein for supper and follow the Category 2 plan. Rebecca Rogers has been instructed to start strengthening exercises. We discussed the following Behavioral Modification Strategies today: increasing lean protein intake, decreasing simple carbohydrates, and no skipping meals.   Rebecca Rogers has agreed to follow up with our clinic in 4 weeks. She was informed of the importance of frequent follow up visits to maximize her success with intensive lifestyle modifications for her multiple health conditions.  ALLERGIES: No Known Allergies  MEDICATIONS: Current Outpatient Medications on File Prior to Visit  Medication Sig Dispense Refill  . levothyroxine (SYNTHROID) 50 MCG tablet Take 0.5 tablets (25 mcg total) by mouth daily before breakfast. 90 tablet 1  . nystatin-triamcinolone (MYCOLOG II) cream Apply 1 application topically 2 (two) times daily. 30 g 1  . sertraline (ZOLOFT) 50 MG tablet Take 1.5 tablets (75 mg total) by mouth daily. 1 & 1/2 tab daily 135 tablet 4  . triamterene-hydrochlorothiazide (MAXZIDE-25) 37.5-25  MG tablet Take 1 tablet by mouth daily. 90 tablet 4   No current facility-administered medications on file prior to visit.     PAST MEDICAL HISTORY: Past Medical History:  Diagnosis Date  . Adenomyosis   . Anxiety   . Cyst of right breast    4-5 oclock 1/2 cm  . HLD (hyperlipidemia)   . Hypertension    bordeline  . Hypothyroidism   . Increased BMI     PAST  SURGICAL HISTORY: Past Surgical History:  Procedure Laterality Date  . ABDOMINAL HYSTERECTOMY     lsh- adenomysis and fibroids  . BREAST BIOPSY Right 2016   NEG  . COLONOSCOPY WITH PROPOFOL N/A 06/05/2015   Procedure: COLONOSCOPY WITH PROPOFOL;  Surgeon: Lucilla Lame, MD;  Location: [facility];  Service: Endoscopy;  Laterality: N/A;  . COLONOSCOPY WITH PROPOFOL N/A 12/04/2015   Procedure: COLONOSCOPY WITH PROPOFOL;  Surgeon: Lucilla Lame, MD;  Location: [place];  Service: Endoscopy;  Laterality: N/A;  . LAPAROSCOPIC SALPINGOOPHERECTOMY     prophylactic  . POLYPECTOMY  06/05/2015   Procedure: POLYPECTOMY;  Surgeon: Lucilla Lame, MD;  Location: [place];  Service: Endoscopy;;  . POLYPECTOMY  12/04/2015   Procedure: POLYPECTOMY;  Surgeon: Lucilla Lame, MD;  Location: [facility];  Service: Endoscopy;;  . TONSILLECTOMY  1965    SOCIAL HISTORY: Social History   Tobacco Use  . Smoking status: Never Smoker  . Smokeless tobacco: Never Used  Substance Use Topics  . Alcohol use: No  . Drug use: No    FAMILY HISTORY: Family History  Problem Relation Age of Onset  . Ovarian cancer Mother   . Prostate cancer Father   . Breast cancer Neg Hx   . Colon cancer Neg Hx   . Diabetes Neg Hx   . Heart disease Neg Hx     ROS: Review of Systems  Constitutional: Positive for weight loss.  Gastrointestinal: Negative for nausea and vomiting.  Musculoskeletal:       Negative for muscle weakness.    PHYSICAL EXAM: Blood pressure 122/72, pulse 65, height 5\' 6"  (1.676 m), weight 163 lb (73.9 kg), SpO2 99 %. Body mass index is 26.31 kg/m. Physical Exam Vitals signs reviewed.  Constitutional:      Appearance: Normal appearance. She is obese.  Cardiovascular:     Rate and Rhythm: Normal rate.  Pulmonary:     Effort: Pulmonary effort is normal.  Musculoskeletal: Normal range of motion.  Skin:    General: Skin is warm and dry.  Neurological:     Mental  Status: She is alert and oriented to person, place, and time.  Psychiatric:        Mood and Affect: Mood normal.        Behavior: Behavior normal.     RECENT LABS AND TESTS: BMET    Component Value Date/Time   NA 137 11/20/2017 0939   K 4.3 11/20/2017 0939   CL 96 11/20/2017 0939   CO2 26 11/20/2017 0939   GLUCOSE 101 (H) 11/20/2017 0939   BUN 14 11/20/2017 0939   CREATININE 0.84 11/20/2017 0939   CALCIUM 10.2 11/20/2017 0939   GFRNONAA 76 11/20/2017 0939   GFRAA 87 11/20/2017 0939   Lab Results  Component Value Date   HGBA1C 5.8 (H) 11/20/2017   HGBA1C 5.9 (H) 08/15/2017   HGBA1C 5.9 (H) 06/22/2017   HGBA1C 5.8 (H) 04/08/2016   HGBA1C 6.0 (H) 04/21/2015   Lab Results  Component Value Date  INSULIN 8.5 11/20/2017   INSULIN 11.7 08/15/2017   CBC    Component Value Date/Time   WBC 6.3 08/15/2017 0940   RBC 4.72 08/15/2017 0940   HGB 14.2 08/15/2017 0940   HCT 43.7 08/15/2017 0940   PLT 365 06/03/2015 1045   MCV 93 08/15/2017 0940   MCH 30.1 08/15/2017 0940   MCHC 32.5 08/15/2017 0940   RDW 14.5 08/15/2017 0940   LYMPHSABS 2.2 08/15/2017 0940   EOSABS 0.3 08/15/2017 0940   BASOSABS 0.0 08/15/2017 0940   Iron/TIBC/Ferritin/ %Sat No results found for: IRON, TIBC, FERRITIN, IRONPCTSAT Lipid Panel     Component Value Date/Time   CHOL 220 (H) 11/20/2017 0939   TRIG 189 (H) 11/20/2017 0939   HDL 34 (L) 11/20/2017 0939   CHOLHDL 6.0 (H) 06/22/2017 0826   LDLCALC 148 (H) 11/20/2017 0939   Hepatic Function Panel     Component Value Date/Time   PROT 7.2 11/20/2017 0939   ALBUMIN 4.5 11/20/2017 0939   AST 17 11/20/2017 0939   ALT 14 11/20/2017 0939   ALKPHOS 97 11/20/2017 0939   BILITOT 0.4 11/20/2017 0939      Component Value Date/Time   TSH 5.730 (H) 11/20/2017 0939   TSH 3.810 08/15/2017 0940   TSH 2.760 06/22/2017 0826   Results for WERONIKA, BIRCH (MRN [id number]) as of 02/07/2018 15:04  Ref. Range 11/20/2017 09:39  Vitamin D, 25-Hydroxy Latest Ref  Range: 30.0 - 100.0 ng/mL 50.2   OBESITY BEHAVIORAL INTERVENTION VISIT  Today's visit was # 10   Starting weight: 180 lbs Starting date: 08/15/17 Today's weight : Weight: 163 lb (73.9 kg)  Today's date: 02/07/2018 Total lbs lost to date: 17  ASK: We discussed the diagnosis of obesity with Deland Pretty today and [name] agreed to give [name] permission to discuss obesity behavioral modification therapy today.  ASSESS: Euretha has the diagnosis of obesity and her BMI today is 26.3. Rebecca Rogers is in the action stage of change.   ADVISE: Abbe was educated on the multiple health risks of obesity as well as the benefit of weight loss to improve her health. She was advised of the need for long term treatment and the importance of lifestyle modifications to improve her current health and to decrease her risk of future health problems.  AGREE: Multiple dietary modification options and treatment options were discussed and Fernanda agreed to follow the recommendations documented in the above note.  ARRANGE: Charice was educated on the importance of frequent visits to treat obesity as outlined per CMS and USPSTF guidelines and agreed to schedule her next follow up appointment today.  IMarcille Blanco, CMA, am acting as transcriptionist for Starlyn Skeans, MD  I have reviewed the above documentation for accuracy and completeness, and I agree with the above. -Dennard Nip, MD

## 2018-03-02 DIAGNOSIS — Z1283 Encounter for screening for malignant neoplasm of skin: Secondary | ICD-10-CM | POA: Diagnosis not present

## 2018-03-02 DIAGNOSIS — L738 Other specified follicular disorders: Secondary | ICD-10-CM | POA: Diagnosis not present

## 2018-03-02 DIAGNOSIS — L821 Other seborrheic keratosis: Secondary | ICD-10-CM | POA: Diagnosis not present

## 2018-03-02 DIAGNOSIS — L570 Actinic keratosis: Secondary | ICD-10-CM | POA: Diagnosis not present

## 2018-03-02 DIAGNOSIS — L814 Other melanin hyperpigmentation: Secondary | ICD-10-CM | POA: Diagnosis not present

## 2018-03-02 DIAGNOSIS — L304 Erythema intertrigo: Secondary | ICD-10-CM | POA: Diagnosis not present

## 2018-03-02 DIAGNOSIS — D229 Melanocytic nevi, unspecified: Secondary | ICD-10-CM | POA: Diagnosis not present

## 2018-03-02 DIAGNOSIS — D1800 Hemangioma unspecified site: Secondary | ICD-10-CM | POA: Diagnosis not present

## 2018-03-07 ENCOUNTER — Encounter (INDEPENDENT_AMBULATORY_CARE_PROVIDER_SITE_OTHER): Payer: Self-pay | Admitting: Family Medicine

## 2018-03-07 ENCOUNTER — Ambulatory Visit (INDEPENDENT_AMBULATORY_CARE_PROVIDER_SITE_OTHER): Payer: 59 | Admitting: Family Medicine

## 2018-03-07 VITALS — BP 103/61 | HR 75 | Ht 66.0 in | Wt 163.0 lb

## 2018-03-07 DIAGNOSIS — E669 Obesity, unspecified: Secondary | ICD-10-CM | POA: Diagnosis not present

## 2018-03-07 DIAGNOSIS — E559 Vitamin D deficiency, unspecified: Secondary | ICD-10-CM | POA: Diagnosis not present

## 2018-03-07 DIAGNOSIS — Z9189 Other specified personal risk factors, not elsewhere classified: Secondary | ICD-10-CM

## 2018-03-07 DIAGNOSIS — Z6830 Body mass index (BMI) 30.0-30.9, adult: Secondary | ICD-10-CM

## 2018-03-07 MED ORDER — VITAMIN D (ERGOCALCIFEROL) 1.25 MG (50000 UNIT) PO CAPS: 50000.0000 [IU] | ORAL_CAPSULE | ORAL | 0 refills | Status: DC

## 2018-03-08 NOTE — Progress Notes (Signed)
Office: [phone]  /  Fax: [phone]   HPI:   Chief Complaint: OBESITY Rebecca Rogers is here to discuss her progress with her obesity treatment plan. She is on the keep a food journal with 400-500 calories and 35 grams of protein at supper daily and follow the Category 2 plan and is following her eating plan approximately 70 % of the time. She states she is exercising 0 minutes 0 times per week. Rebecca Rogers has done very well maintaining her weight. She is almost at her goal weight and is tolerating Category 2 well.  Her weight is 163 lb (73.9 kg) today and has not lost weight since her last visit. She has lost 17 lbs since starting treatment with [name].  Vitamin D Deficiency Rebecca Rogers has a diagnosis of vitamin D deficiency. She is stable on prescription Vit D and denies nausea, vomiting or muscle weakness.  At risk for osteopenia and osteoporosis Rebecca Rogers is at higher risk of osteopenia and osteoporosis due to vitamin D deficiency.   ASSESSMENT AND PLAN:  Vitamin D deficiency - Plan: Vitamin D, Ergocalciferol, (DRISDOL) 1.25 MG (50000 UT) CAPS capsule  At risk for osteoporosis  Class 1 obesity with serious comorbidity and body mass index (BMI) of 30.0 to 30.9 in adult, unspecified obesity type - beginning BMI >30  PLAN:  Vitamin D Deficiency Rebecca Rogers was informed that low vitamin D levels contributes to fatigue and are associated with obesity, breast, and colon cancer. Rebecca Rogers agrees to continue taking prescription Vit D @50 ,000 IU every week #4 and we will refill for 1 month. She will follow up for routine testing of vitamin D, at least 2-3 times per year. She was informed of the risk of over-replacement of vitamin D and agrees to not increase her dose unless she discusses this with [name] first. Rebecca Rogers agrees to follow up with our clinic in 4 weeks.  At risk for osteopenia and osteoporosis Rebecca Rogers was given extended (15 minutes) osteoporosis prevention counseling today. Rebecca Rogers is at risk for  osteopenia and osteoporsis due to her vitamin D deficiency. She was encouraged to take her vitamin D and follow her higher calcium diet and increase strengthening exercise to help strengthen her bones and decrease her risk of osteopenia and osteoporosis.  Obesity Rebecca Rogers is currently in the action stage of change. As such, her goal is to continue with weight loss efforts She has agreed to follow the keep a food journal with 400-500 calories and 35 grams of protein at supper daily and follow the Category 2 plan Rebecca Rogers has been instructed to work up to a goal of 150 minutes of combined cardio and strengthening exercise per week or start strengthening exercises for 10-15 minutes daily for weight loss and overall health benefits. We discussed the following Behavioral Modification Strategies today: increasing lean protein and meal planning & cooking strategies  Rebecca Rogers has agreed to follow up with our clinic in 4 weeks. She was informed of the importance of frequent follow up visits to maximize her success with intensive lifestyle modifications for her multiple health conditions.  ALLERGIES: No Known Allergies  MEDICATIONS: Current Outpatient Medications on File Prior to Visit  Medication Sig Dispense Refill  . levothyroxine (SYNTHROID) 50 MCG tablet Take 0.5 tablets (25 mcg total) by mouth daily before breakfast. 90 tablet 1  . nystatin-triamcinolone (MYCOLOG II) cream Apply 1 application topically 2 (two) times daily. 30 g 1  . sertraline (ZOLOFT) 50 MG tablet Take 1.5 tablets (75 mg total) by mouth daily. 1 & 1/2  tab daily 135 tablet 4  . triamterene-hydrochlorothiazide (MAXZIDE-25) 37.5-25 MG tablet Take 1 tablet by mouth daily. 90 tablet 4   No current facility-administered medications on file prior to visit.     PAST MEDICAL HISTORY: Past Medical History:  Diagnosis Date  . Adenomyosis   . Anxiety   . Cyst of right breast    4-5 oclock 1/2 cm  . HLD (hyperlipidemia)   . Hypertension      bordeline  . Hypothyroidism   . Increased BMI     PAST SURGICAL HISTORY: Past Surgical History:  Procedure Laterality Date  . ABDOMINAL HYSTERECTOMY     lsh- adenomysis and fibroids  . BREAST BIOPSY Right 2016   NEG  . COLONOSCOPY WITH PROPOFOL N/A 06/05/2015   Procedure: COLONOSCOPY WITH PROPOFOL;  Surgeon: Lucilla Lame, MD;  Location: [facility];  Service: Endoscopy;  Laterality: N/A;  . COLONOSCOPY WITH PROPOFOL N/A 12/04/2015   Procedure: COLONOSCOPY WITH PROPOFOL;  Surgeon: Lucilla Lame, MD;  Location: [place];  Service: Endoscopy;  Laterality: N/A;  . LAPAROSCOPIC SALPINGOOPHERECTOMY     prophylactic  . POLYPECTOMY  06/05/2015   Procedure: POLYPECTOMY;  Surgeon: Lucilla Lame, MD;  Location: [place];  Service: Endoscopy;;  . POLYPECTOMY  12/04/2015   Procedure: POLYPECTOMY;  Surgeon: Lucilla Lame, MD;  Location: [facility];  Service: Endoscopy;;  . TONSILLECTOMY  1965    SOCIAL HISTORY: Social History   Tobacco Use  . Smoking status: Never Smoker  . Smokeless tobacco: Never Used  Substance Use Topics  . Alcohol use: No  . Drug use: No    FAMILY HISTORY: Family History  Problem Relation Age of Onset  . Ovarian cancer Mother   . Prostate cancer Father   . Breast cancer Neg Hx   . Colon cancer Neg Hx   . Diabetes Neg Hx   . Heart disease Neg Hx     ROS: Review of Systems  Constitutional: Negative for weight loss.  Gastrointestinal: Negative for nausea and vomiting.  Musculoskeletal:       Negative muscle weakness    PHYSICAL EXAM: Blood pressure 103/61, pulse 75, height 5\' 6"  (1.676 m), weight 163 lb (73.9 kg), SpO2 100 %. Body mass index is 26.31 kg/m. Physical Exam Vitals signs reviewed.  Constitutional:      Appearance: Normal appearance. She is obese.  Cardiovascular:     Rate and Rhythm: Normal rate.     Pulses: Normal pulses.  Pulmonary:     Effort: Pulmonary effort is normal.     Breath sounds: Normal  breath sounds.  Musculoskeletal: Normal range of motion.  Skin:    General: Skin is warm and dry.  Neurological:     Mental Status: She is alert and oriented to person, place, and time.  Psychiatric:        Mood and Affect: Mood normal.        Behavior: Behavior normal.     RECENT LABS AND TESTS: BMET    Component Value Date/Time   NA 137 11/20/2017 0939   K 4.3 11/20/2017 0939   CL 96 11/20/2017 0939   CO2 26 11/20/2017 0939   GLUCOSE 101 (H) 11/20/2017 0939   BUN 14 11/20/2017 0939   CREATININE 0.84 11/20/2017 0939   CALCIUM 10.2 11/20/2017 0939   GFRNONAA 76 11/20/2017 0939   GFRAA 87 11/20/2017 0939   Lab Results  Component Value Date   HGBA1C 5.8 (H) 11/20/2017   HGBA1C 5.9 (H) 08/15/2017  HGBA1C 5.9 (H) 06/22/2017   HGBA1C 5.8 (H) 04/08/2016   HGBA1C 6.0 (H) 04/21/2015   Lab Results  Component Value Date   INSULIN 8.5 11/20/2017   INSULIN 11.7 08/15/2017   CBC    Component Value Date/Time   WBC 6.3 08/15/2017 0940   RBC 4.72 08/15/2017 0940   HGB 14.2 08/15/2017 0940   HCT 43.7 08/15/2017 0940   PLT 365 06/03/2015 1045   MCV 93 08/15/2017 0940   MCH 30.1 08/15/2017 0940   MCHC 32.5 08/15/2017 0940   RDW 14.5 08/15/2017 0940   LYMPHSABS 2.2 08/15/2017 0940   EOSABS 0.3 08/15/2017 0940   BASOSABS 0.0 08/15/2017 0940   Iron/TIBC/Ferritin/ %Sat No results found for: IRON, TIBC, FERRITIN, IRONPCTSAT Lipid Panel     Component Value Date/Time   CHOL 220 (H) 11/20/2017 0939   TRIG 189 (H) 11/20/2017 0939   HDL 34 (L) 11/20/2017 0939   CHOLHDL 6.0 (H) 06/22/2017 0826   LDLCALC 148 (H) 11/20/2017 0939   Hepatic Function Panel     Component Value Date/Time   PROT 7.2 11/20/2017 0939   ALBUMIN 4.5 11/20/2017 0939   AST 17 11/20/2017 0939   ALT 14 11/20/2017 0939   ALKPHOS 97 11/20/2017 0939   BILITOT 0.4 11/20/2017 0939      Component Value Date/Time   TSH 5.730 (H) 11/20/2017 0939   TSH 3.810 08/15/2017 0940   TSH 2.760 06/22/2017 0826       OBESITY BEHAVIORAL INTERVENTION VISIT  Today's visit was # 11   Starting weight: 180 lbs Starting date: 08/15/17 Today's weight : 163 lbs  Today's date: 03/07/2018 Total lbs lost to date: 17    03/07/2018  Height 5\' 6"  (1.676 m)  Weight 163 lb (73.9 kg)  BMI (Calculated) 26.32  BLOOD PRESSURE - SYSTOLIC 845  BLOOD PRESSURE - DIASTOLIC 61   Body Fat % 36.4 %  Total Body Water (lbs) 69.4 lbs     ASK: We discussed the diagnosis of obesity with Rebecca Rogers today and Rebecca Rogers agreed to give [name] permission to discuss obesity behavioral modification therapy today.  ASSESS: Rebecca Rogers has the diagnosis of obesity and her BMI today is 26.32 Rebecca Rogers is in the action stage of change   ADVISE: Rebecca Rogers was educated on the multiple health risks of obesity as well as the benefit of weight loss to improve her health. She was advised of the need for long term treatment and the importance of lifestyle modifications to improve her current health and to decrease her risk of future health problems.  AGREE: Multiple dietary modification options and treatment options were discussed and  Rebecca Rogers agreed to follow the recommendations documented in the above note.  ARRANGE: Rebecca Rogers was educated on the importance of frequent visits to treat obesity as outlined per CMS and USPSTF guidelines and agreed to schedule her next follow up appointment today.  I, Trixie Dredge, am acting as transcriptionist for Dennard Nip, MD  I have reviewed the above documentation for accuracy and completeness, and I agree with the above. -Dennard Nip, MD

## 2018-03-26 ENCOUNTER — Encounter (INDEPENDENT_AMBULATORY_CARE_PROVIDER_SITE_OTHER): Payer: Self-pay | Admitting: Family Medicine

## 2018-03-26 NOTE — Telephone Encounter (Signed)
Please see test

## 2018-03-29 ENCOUNTER — Encounter (INDEPENDENT_AMBULATORY_CARE_PROVIDER_SITE_OTHER): Payer: Self-pay

## 2018-04-04 ENCOUNTER — Other Ambulatory Visit: Payer: Self-pay

## 2018-04-04 ENCOUNTER — Ambulatory Visit (INDEPENDENT_AMBULATORY_CARE_PROVIDER_SITE_OTHER): Payer: 59 | Admitting: Family Medicine

## 2018-04-04 ENCOUNTER — Encounter (INDEPENDENT_AMBULATORY_CARE_PROVIDER_SITE_OTHER): Payer: Self-pay | Admitting: Family Medicine

## 2018-04-04 VITALS — BP 103/67 | HR 72 | Ht 66.0 in | Wt 161.0 lb

## 2018-04-04 DIAGNOSIS — E669 Obesity, unspecified: Secondary | ICD-10-CM | POA: Diagnosis not present

## 2018-04-04 DIAGNOSIS — F3289 Other specified depressive episodes: Secondary | ICD-10-CM | POA: Diagnosis not present

## 2018-04-04 DIAGNOSIS — Z6830 Body mass index (BMI) 30.0-30.9, adult: Secondary | ICD-10-CM

## 2018-04-04 DIAGNOSIS — E559 Vitamin D deficiency, unspecified: Secondary | ICD-10-CM

## 2018-04-04 DIAGNOSIS — Z9189 Other specified personal risk factors, not elsewhere classified: Secondary | ICD-10-CM | POA: Diagnosis not present

## 2018-04-04 MED ORDER — VITAMIN D (ERGOCALCIFEROL) 1.25 MG (50000 UNIT) PO CAPS: 50000.0000 [IU] | ORAL_CAPSULE | ORAL | 1 refills | Status: DC

## 2018-04-04 NOTE — Progress Notes (Signed)
Office: [phone]  /  Fax: [phone]   HPI:   Chief Complaint: OBESITY Rebecca Rogers is here to discuss her progress with her obesity treatment plan. She is on the Category 2 plan and is following her eating plan approximately 70 % of the time. She states she is exercising 0 minutes 0 times per week. Ilyse continues to do well with weight loss and is almost at her goal. She continues to follow her Category 2 plan in general and is mindful when she eats out. Her hunger is controlled and she is not doing much stress eating.  Her weight is 161 lb (73 kg) today and has had a weight loss of 2 pounds over a period of 4 weeks since her last visit. She has lost 19 lbs since starting treatment with [name].  Vitamin D Deficiency Rebecca Rogers has a diagnosis of vitamin D deficiency. She is currently stable on vit D. Rebecca Rogers denies nausea, vomiting, or muscle weakness.  At risk for osteopenia and osteoporosis Rebecca Rogers is at higher risk of osteopenia and osteoporosis due to vitamin D deficiency.   Depression with emotional eating behaviors Rebecca Rogers's mood is stable on Zoloft. She is happy in her marriage and at work. Rebecca Rogers is sleeping well and avoiding comfort eating and drinking. She has been working on behavior modification techniques to help reduce her emotional eating and has been somewhat successful. She shows no sign of suicidal or homicidal ideations.  ASSESSMENT AND PLAN:  Vitamin D deficiency - Plan: VITAMIN D 25 Hydroxy (Vit-D Deficiency, Fractures), Vitamin D, Ergocalciferol, (DRISDOL) 1.25 MG (50000 UT) CAPS capsule  Other depression - with emotional eating  At risk for osteoporosis  Class 1 obesity with serious comorbidity and body mass index (BMI) of 30.0 to 30.9 in adult, unspecified obesity type - Starting BMI greater then 30  PLAN:  Vitamin D Deficiency Rebecca Rogers was informed that low vitamin D levels contribute to fatigue and are associated with obesity, breast, and colon cancer. Rebecca Rogers  agrees to continue to take prescription Vit D @50 ,000 IU every week #4 with 1 refill and will follow up for routine testing of vitamin D, at least 2-3 times per year. She was informed of the risk of over-replacement of vitamin D and agrees to not increase her dose unless she discusses this with [name] first. Labs will be ordered and Rebecca Rogers agrees to follow up in 8 weeks as directed.  At risk for osteopenia and osteoporosis Rebecca Rogers was given extended (15 minutes) osteoporosis prevention counseling today. Rebecca Rogers is at risk for osteopenia and osteoporosis due to her vitamin D deficiency. She was encouraged to take her vitamin D and follow her higher calcium diet and increase strengthening exercise to help strengthen her bones and decrease her risk of osteopenia and osteoporosis.  Depression with Emotional Eating Behaviors We discussed behavior modification techniques today to help Rebecca Rogers deal with her emotional eating and depression. She has agreed to continue Zoloft and cognitive behavioral therapies to help reduce stress eating.  Obesity Rebecca Rogers is currently in the action stage of change. As such, her goal is to continue with weight loss efforts. She has agreed to follow the Category 2 plan. Rebecca Rogers has been instructed to work up to a goal of 150 minutes of combined cardio and strengthening exercise per week for weight loss and overall health benefits. We discussed the following Behavioral Modification Strategies today: increasing lean protein intake, decreasing simple carbohydrates, and work on meal planning and easy cooking plans.  Rebecca Rogers has agreed to follow  up with our clinic in 8 weeks. She was informed of the importance of frequent follow up visits to maximize her success with intensive lifestyle modifications for her multiple health conditions.  ALLERGIES: No Known Allergies  MEDICATIONS: Current Outpatient Medications on File Prior to Visit  Medication Sig Dispense Refill  . levothyroxine  (SYNTHROID) 50 MCG tablet Take 0.5 tablets (25 mcg total) by mouth daily before breakfast. 90 tablet 1  . nystatin-triamcinolone (MYCOLOG II) cream Apply 1 application topically 2 (two) times daily. 30 g 1  . sertraline (ZOLOFT) 50 MG tablet Take 1.5 tablets (75 mg total) by mouth daily. 1 & 1/2 tab daily 135 tablet 4  . triamterene-hydrochlorothiazide (MAXZIDE-25) 37.5-25 MG tablet Take 1 tablet by mouth daily. 90 tablet 4   No current facility-administered medications on file prior to visit.     PAST MEDICAL HISTORY: Past Medical History:  Diagnosis Date  . Adenomyosis   . Anxiety   . Cyst of right breast    4-5 oclock 1/2 cm  . HLD (hyperlipidemia)   . Hypertension    bordeline  . Hypothyroidism   . Increased BMI     PAST SURGICAL HISTORY: Past Surgical History:  Procedure Laterality Date  . ABDOMINAL HYSTERECTOMY     lsh- adenomysis and fibroids  . BREAST BIOPSY Right 2016   NEG  . COLONOSCOPY WITH PROPOFOL N/A 06/05/2015   Procedure: COLONOSCOPY WITH PROPOFOL;  Surgeon: Lucilla Lame, MD;  Location: [facility];  Service: Endoscopy;  Laterality: N/A;  . COLONOSCOPY WITH PROPOFOL N/A 12/04/2015   Procedure: COLONOSCOPY WITH PROPOFOL;  Surgeon: Lucilla Lame, MD;  Location: [facility];  Service: Endoscopy;  Laterality: N/A;  . LAPAROSCOPIC SALPINGOOPHERECTOMY     prophylactic  . POLYPECTOMY  06/05/2015   Procedure: POLYPECTOMY;  Surgeon: Lucilla Lame, MD;  Location: [facility];  Service: Endoscopy;;  . POLYPECTOMY  12/04/2015   Procedure: POLYPECTOMY;  Surgeon: Lucilla Lame, MD;  Location: [place];  Service: Endoscopy;;  . TONSILLECTOMY  1965    SOCIAL HISTORY: Social History   Tobacco Use  . Smoking status: Never Smoker  . Smokeless tobacco: Never Used  Substance Use Topics  . Alcohol use: No  . Drug use: No    FAMILY HISTORY: Family History  Problem Relation Age of Onset  . Ovarian cancer Mother   . Prostate cancer Father   .  Breast cancer Neg Hx   . Colon cancer Neg Hx   . Diabetes Neg Hx   . Heart disease Neg Hx     ROS: Review of Systems  Constitutional: Positive for weight loss.  Gastrointestinal: Negative for nausea and vomiting.  Musculoskeletal:       Negative for muscle weakness.  Psychiatric/Behavioral: Positive for depression. Negative for suicidal ideas.       Negative for homicidal ideations.    PHYSICAL EXAM: Blood pressure 103/67, pulse 72, height 5\' 6"  (1.676 m), weight 161 lb (73 kg), SpO2 98 %. Body mass index is 25.99 kg/m. Physical Exam Vitals signs reviewed.  Constitutional:      Appearance: Normal appearance. She is obese.  Cardiovascular:     Rate and Rhythm: Normal rate.  Pulmonary:     Effort: Pulmonary effort is normal.  Musculoskeletal: Normal range of motion.  Skin:    General: Skin is warm and dry.  Neurological:     Mental Status: She is alert and oriented to person, place, and time.  Psychiatric:  Mood and Affect: Mood normal.        Behavior: Behavior normal.     RECENT LABS AND TESTS: BMET    Component Value Date/Time   NA 137 11/20/2017 0939   K 4.3 11/20/2017 0939   CL 96 11/20/2017 0939   CO2 26 11/20/2017 0939   GLUCOSE 101 (H) 11/20/2017 0939   BUN 14 11/20/2017 0939   CREATININE 0.84 11/20/2017 0939   CALCIUM 10.2 11/20/2017 0939   GFRNONAA 76 11/20/2017 0939   GFRAA 87 11/20/2017 0939   Lab Results  Component Value Date   HGBA1C 5.8 (H) 11/20/2017   HGBA1C 5.9 (H) 08/15/2017   HGBA1C 5.9 (H) 06/22/2017   HGBA1C 5.8 (H) 04/08/2016   HGBA1C 6.0 (H) 04/21/2015   Lab Results  Component Value Date   INSULIN 8.5 11/20/2017   INSULIN 11.7 08/15/2017   CBC    Component Value Date/Time   WBC 6.3 08/15/2017 0940   RBC 4.72 08/15/2017 0940   HGB 14.2 08/15/2017 0940   HCT 43.7 08/15/2017 0940   PLT 365 06/03/2015 1045   MCV 93 08/15/2017 0940   MCH 30.1 08/15/2017 0940   MCHC 32.5 08/15/2017 0940   RDW 14.5 08/15/2017 0940    LYMPHSABS 2.2 08/15/2017 0940   EOSABS 0.3 08/15/2017 0940   BASOSABS 0.0 08/15/2017 0940   Iron/TIBC/Ferritin/ %Sat No results found for: IRON, TIBC, FERRITIN, IRONPCTSAT Lipid Panel     Component Value Date/Time   CHOL 220 (H) 11/20/2017 0939   TRIG 189 (H) 11/20/2017 0939   HDL 34 (L) 11/20/2017 0939   CHOLHDL 6.0 (H) 06/22/2017 0826   LDLCALC 148 (H) 11/20/2017 0939   Hepatic Function Panel     Component Value Date/Time   PROT 7.2 11/20/2017 0939   ALBUMIN 4.5 11/20/2017 0939   AST 17 11/20/2017 0939   ALT 14 11/20/2017 0939   ALKPHOS 97 11/20/2017 0939   BILITOT 0.4 11/20/2017 0939      Component Value Date/Time   TSH 5.730 (H) 11/20/2017 0939   TSH 3.810 08/15/2017 0940   TSH 2.760 06/22/2017 0826   Results for REISHA, WOS (MRN [id number]) as of 04/04/2018 12:19  Ref. Range 11/20/2017 09:39  Vitamin D, 25-Hydroxy Latest Ref Range: 30.0 - 100.0 ng/mL 50.2    OBESITY BEHAVIORAL INTERVENTION VISIT  Today's visit was # 12  Starting weight: 180 lbs Starting date: 08/15/17 Today's weight : Weight: 161 lb (73 kg)  Today's date: 04/04/2018 Total lbs lost to date: 19 lbs     04/04/2018  Height 5\' 6"  (1.676 m)  Weight 161 lb (73 kg)  BMI (Calculated) 26  BLOOD PRESSURE - SYSTOLIC 580  BLOOD PRESSURE - DIASTOLIC 67   Body Fat % 99.8 %  Total Body Water (lbs) 67.2 lbs   ASK: We discussed the diagnosis of obesity with Deland Pretty today and Mckayla agreed to give [name] permission to discuss obesity behavioral modification therapy today.  ASSESS: Mio has the diagnosis of obesity and her BMI today is 25.9. Saree is in the action stage of change.   ADVISE: Faline was educated on the multiple health risks of obesity as well as the benefit of weight loss to improve her health. She was advised of the need for long term treatment and the importance of lifestyle modifications to improve her current health and to decrease her risk of future health problems.  AGREE:  Multiple dietary modification options and treatment options were discussed and Corynn agreed to follow the  recommendations documented in the above note.  ARRANGE: Kataleena was educated on the importance of frequent visits to treat obesity as outlined per CMS and USPSTF guidelines and agreed to schedule her next follow up appointment today.  IMarcille Blanco, CMA, am acting as transcriptionist for Starlyn Skeans, MD I have reviewed the above documentation for accuracy and completeness, and I agree with the above. -Dennard Nip, MD

## 2018-04-05 ENCOUNTER — Other Ambulatory Visit (INDEPENDENT_AMBULATORY_CARE_PROVIDER_SITE_OTHER): Payer: Self-pay

## 2018-04-05 DIAGNOSIS — R7303 Prediabetes: Secondary | ICD-10-CM | POA: Diagnosis not present

## 2018-04-05 DIAGNOSIS — E559 Vitamin D deficiency, unspecified: Secondary | ICD-10-CM

## 2018-04-06 LAB — INSULIN, RANDOM: INSULIN: 7.8 u[IU]/mL (ref 2.6–24.9)

## 2018-04-06 LAB — COMPREHENSIVE METABOLIC PANEL
ALT: 12 IU/L (ref 0–32)
AST: 15 IU/L (ref 0–40)
Albumin/Globulin Ratio: 1.5 (ref 1.2–2.2)
Albumin: 4.2 g/dL (ref 3.8–4.9)
Alkaline Phosphatase: 89 IU/L (ref 39–117)
BUN/Creatinine Ratio: 18 (ref 12–28)
BUN: 14 mg/dL (ref 8–27)
Bilirubin Total: 0.3 mg/dL (ref 0.0–1.2)
CO2: 27 mmol/L (ref 20–29)
Calcium: 9.6 mg/dL (ref 8.7–10.3)
Chloride: 99 mmol/L (ref 96–106)
Creatinine, Ser: 0.79 mg/dL (ref 0.57–1.00)
GFR calc Af Amer: 94 mL/min/{1.73_m2} (ref >59)
GFR calc non Af Amer: 82 mL/min/{1.73_m2} (ref >59)
Globulin, Total: 2.8 g/dL (ref 1.5–4.5)
Glucose: 98 mg/dL (ref 65–99)
Potassium: 4.1 mmol/L (ref 3.5–5.2)
Sodium: 141 mmol/L (ref 134–144)
Total Protein: 7 g/dL (ref 6.0–8.5)

## 2018-04-06 LAB — CBC WITH DIFFERENTIAL
Basophils Absolute: 0 10*3/uL (ref 0.0–0.2)
Basos: 1 %
EOS (ABSOLUTE): 0.4 10*3/uL (ref 0.0–0.4)
Eos: 9 %
Hematocrit: 40.8 % (ref 34.0–46.6)
Hemoglobin: 13.8 g/dL (ref 11.1–15.9)
Immature Grans (Abs): 0 10*3/uL (ref 0.0–0.1)
Immature Granulocytes: 0 %
Lymphocytes Absolute: 1.7 10*3/uL (ref 0.7–3.1)
Lymphs: 36 %
MCH: 29.6 pg (ref 26.6–33.0)
MCHC: 33.8 g/dL (ref 31.5–35.7)
MCV: 87 fL (ref 79–97)
Monocytes Absolute: 0.4 10*3/uL (ref 0.1–0.9)
Monocytes: 8 %
Neutrophils Absolute: 2.3 10*3/uL (ref 1.4–7.0)
Neutrophils: 46 %
RBC: 4.67 x10E6/uL (ref 3.77–5.28)
RDW: 13.6 % (ref 11.7–15.4)
WBC: 4.9 10*3/uL (ref 3.4–10.8)

## 2018-04-06 LAB — LIPID PANEL WITH LDL/HDL RATIO
Cholesterol, Total: 205 mg/dL — ABNORMAL HIGH (ref 100–199)
HDL: 34 mg/dL — ABNORMAL LOW (ref >39)
LDL Calculated: 141 mg/dL — ABNORMAL HIGH (ref 0–99)
LDl/HDL Ratio: 4.1 ratio — ABNORMAL HIGH (ref 0.0–3.2)
Triglycerides: 151 mg/dL — ABNORMAL HIGH (ref 0–149)
VLDL Cholesterol Cal: 30 mg/dL (ref 5–40)

## 2018-04-06 LAB — HEMOGLOBIN A1C
Est. average glucose Bld gHb Est-mCnc: 123 mg/dL
Hgb A1c MFr Bld: 5.9 % — ABNORMAL HIGH (ref 4.8–5.6)

## 2018-04-06 LAB — T4, FREE: Free T4: 1.1 ng/dL (ref 0.82–1.77)

## 2018-04-06 LAB — TSH: TSH: 3.31 u[IU]/mL (ref 0.450–4.500)

## 2018-04-06 LAB — T3: T3, Total: 96 ng/dL (ref 71–180)

## 2018-04-06 LAB — FOLATE: Folate: 7.2 ng/mL (ref >3.0)

## 2018-04-06 LAB — VITAMIN B12: Vitamin B-12: 547 pg/mL (ref 232–1245)

## 2018-04-06 LAB — VITAMIN D 25 HYDROXY (VIT D DEFICIENCY, FRACTURES): Vit D, 25-Hydroxy: 76.3 ng/mL (ref 30.0–100.0)

## 2018-04-18 ENCOUNTER — Other Ambulatory Visit: Payer: Self-pay

## 2018-04-18 DIAGNOSIS — F419 Anxiety disorder, unspecified: Secondary | ICD-10-CM

## 2018-04-18 MED ORDER — TRIAMTERENE-HCTZ 37.5-25 MG PO TABS: 1.0000 | ORAL_TABLET | Freq: Every day | ORAL | 0 refills | Status: DC

## 2018-04-18 MED ORDER — SERTRALINE HCL 50 MG PO TABS: 75.0000 mg | ORAL_TABLET | Freq: Every day | ORAL | 0 refills | Status: DC

## 2018-04-19 ENCOUNTER — Encounter: Payer: 59 | Admitting: Obstetrics and Gynecology

## 2018-05-10 ENCOUNTER — Other Ambulatory Visit: Payer: Self-pay

## 2018-05-10 MED ORDER — LEVOTHYROXINE SODIUM 50 MCG PO TABS: 25.0000 ug | ORAL_TABLET | Freq: Every day | ORAL | 1 refills | Status: DC

## 2018-05-15 ENCOUNTER — Encounter: Payer: 59 | Admitting: Obstetrics and Gynecology

## 2018-05-30 ENCOUNTER — Encounter (INDEPENDENT_AMBULATORY_CARE_PROVIDER_SITE_OTHER): Payer: Self-pay | Admitting: Family Medicine

## 2018-05-30 ENCOUNTER — Other Ambulatory Visit: Payer: Self-pay

## 2018-05-30 ENCOUNTER — Ambulatory Visit (INDEPENDENT_AMBULATORY_CARE_PROVIDER_SITE_OTHER): Payer: 59 | Admitting: Family Medicine

## 2018-05-30 VITALS — BP 95/60 | HR 63 | Temp 98.1°F | Ht 66.0 in | Wt 159.0 lb

## 2018-05-30 DIAGNOSIS — R7303 Prediabetes: Secondary | ICD-10-CM

## 2018-05-30 DIAGNOSIS — Z6830 Body mass index (BMI) 30.0-30.9, adult: Secondary | ICD-10-CM | POA: Diagnosis not present

## 2018-05-30 DIAGNOSIS — E669 Obesity, unspecified: Secondary | ICD-10-CM | POA: Diagnosis not present

## 2018-05-30 DIAGNOSIS — E559 Vitamin D deficiency, unspecified: Secondary | ICD-10-CM | POA: Diagnosis not present

## 2018-05-30 DIAGNOSIS — E66811 Obesity, class 1: Secondary | ICD-10-CM

## 2018-05-30 DIAGNOSIS — Z9189 Other specified personal risk factors, not elsewhere classified: Secondary | ICD-10-CM | POA: Diagnosis not present

## 2018-05-30 MED ORDER — METFORMIN HCL 500 MG PO TABS: 500.0000 mg | ORAL_TABLET | Freq: Every day | ORAL | 0 refills | Status: DC

## 2018-05-30 MED ORDER — VITAMIN D 50 MCG (2000 UT) PO TABS: 2000.0000 [IU] | ORAL_TABLET | Freq: Every day | ORAL | 0 refills | Status: DC

## 2018-05-30 NOTE — Progress Notes (Signed)
Office: [phone]  /  Fax: [phone]   HPI:   Chief Complaint: OBESITY Rebecca Rogers is here to discuss her progress with her obesity treatment plan. She is on the Category 2 plan and keeping a food journal and is following her eating plan approximately 40 % of the time. She states she is exercising 0 minutes 0 times per week. Rebecca Rogers continues to do well with weight loss. She is journaling on and off and her average calorie amount has been 1300 to 1400 calories. She is mindful of her food choices, but her hunger has increased.  Her weight is 159 lb (72.1 kg) today and has had a weight loss of 2 pounds over a period of 8 weeks since her last visit. She has lost 21 lbs since starting treatment with [name].  Vitamin D Deficiency Rebecca Rogers has a diagnosis of vitamin D deficiency. She is currently on vit D, but is now at goal and she is at risk of over-replacement with her weight loss. Rebecca Rogers denies nausea, vomiting, or muscle weakness.  Pre-Diabetes Rebecca Rogers has a diagnosis of pre-diabetes based on her elevated Hgb A1c and was informed this puts her at greater risk of developing diabetes. Her A1c has not improved and she is noting increased polyphagia in the daytime. She is not taking metformin currently and continues to work on diet and exercise to decrease risk of diabetes. She denies hypoglycemia.   At risk for diabetes Rebecca Rogers is at higher than average risk for developing diabetes due to her pre-diabetes and obesity.   ASSESSMENT AND PLAN:  Vitamin D deficiency - Plan: Cholecalciferol (VITAMIN D) 53 MCG (2000 UT) tablet  Prediabetes - Plan: metFORMIN (GLUCOPHAGE) 500 MG tablet  At risk for diabetes mellitus  Class 1 obesity with serious comorbidity and body mass index (BMI) of 30.0 to 30.9 in adult, unspecified obesity type - Starting BMI greater then 30  PLAN:  Vitamin D Deficiency Rebecca Rogers was informed that low vitamin D levels contribute to fatigue and are associated with obesity, breast,  and colon cancer. Rebecca Rogers agrees to continue to take prescription Vit D @2 ,000 IU every day #30 with no refills and will follow up for routine testing of vitamin D, at least 2-3 times per year. She was informed of the risk of over-replacement of vitamin D and agrees to not increase her dose unless she discusses this with [name] first. Rebecca Rogers agrees to follow up in 4 weeks as directed.  Pre-Diabetes Rebecca Rogers will continue to work on weight loss, exercise, and decreasing simple carbohydrates in her diet to help decrease the risk of diabetes. She was informed that eating too many simple carbohydrates or too many calories at one sitting increases the likelihood of GI side effects. Rebecca Rogers agreed to start metformin 500 mg qAM #30 with no refills and a prescription was written today. Rebecca Rogers agreed to follow up with [name] as directed to monitor her progress in 4 weeks.   Diabetes risk counseling Rebecca Rogers was given extended (15 minutes) diabetes prevention counseling today. She is 63 y.o. female and has risk factors for diabetes including pre-diabetes and obesity. We discussed intensive lifestyle modifications today with an emphasis on weight loss as well as increasing exercise and decreasing simple carbohydrates in her diet.  Obesity Rebecca Rogers is currently in the action stage of change. As such, her goal is to continue with weight loss efforts. She has agreed to keep a food journal with 1300 to 1400 calories and 90+ grams of protein.  Rebecca Rogers has been instructed  to work up to a goal of 150 minutes of combined cardio and strengthening exercise per week for weight loss and overall health benefits. We discussed the following Behavioral Modification Strategies today: increasing vegetables, keeping healthy foods in the home, keep a strict food journal, and emotional eating strategies.  Rebecca Rogers has agreed to follow up with our clinic in 4 weeks. She was informed of the importance of frequent follow up visits to maximize her success  with intensive lifestyle modifications for her multiple health conditions.  ALLERGIES: No Known Allergies  MEDICATIONS: Current Outpatient Medications on File Prior to Visit  Medication Sig Dispense Refill  . levothyroxine (SYNTHROID) 50 MCG tablet Take 0.5 tablets (25 mcg total) by mouth daily before breakfast. 90 tablet 1  . nystatin-triamcinolone (MYCOLOG II) cream Apply 1 application topically 2 (two) times daily. 30 g 1  . sertraline (ZOLOFT) 50 MG tablet Take 1.5 tablets (75 mg total) by mouth daily. 1 & 1/2 tab daily 135 tablet 0  . triamterene-hydrochlorothiazide (MAXZIDE-25) 37.5-25 MG tablet Take 1 tablet by mouth daily. 90 tablet 0   No current facility-administered medications on file prior to visit.     PAST MEDICAL HISTORY: Past Medical History:  Diagnosis Date  . Adenomyosis   . Anxiety   . Cyst of right breast    4-5 oclock 1/2 cm  . HLD (hyperlipidemia)   . Hypertension    bordeline  . Hypothyroidism   . Increased BMI     PAST SURGICAL HISTORY: Past Surgical History:  Procedure Laterality Date  . ABDOMINAL HYSTERECTOMY     lsh- adenomysis and fibroids  . BREAST BIOPSY Right 2016   NEG  . COLONOSCOPY WITH PROPOFOL N/A 06/05/2015   Procedure: COLONOSCOPY WITH PROPOFOL;  Surgeon: Lucilla Lame, MD;  Location: [facility];  Service: Endoscopy;  Laterality: N/A;  . COLONOSCOPY WITH PROPOFOL N/A 12/04/2015   Procedure: COLONOSCOPY WITH PROPOFOL;  Surgeon: Lucilla Lame, MD;  Location: [place];  Service: Endoscopy;  Laterality: N/A;  . LAPAROSCOPIC SALPINGOOPHERECTOMY     prophylactic  . POLYPECTOMY  06/05/2015   Procedure: POLYPECTOMY;  Surgeon: Lucilla Lame, MD;  Location: [facility];  Service: Endoscopy;;  . POLYPECTOMY  12/04/2015   Procedure: POLYPECTOMY;  Surgeon: Lucilla Lame, MD;  Location: [facility];  Service: Endoscopy;;  . TONSILLECTOMY  1965    SOCIAL HISTORY: Social History   Tobacco Use  . Smoking status: Never  Smoker  . Smokeless tobacco: Never Used  Substance Use Topics  . Alcohol use: No  . Drug use: No    FAMILY HISTORY: Family History  Problem Relation Age of Onset  . Ovarian cancer Mother   . Prostate cancer Father   . Breast cancer Neg Hx   . Colon cancer Neg Hx   . Diabetes Neg Hx   . Heart disease Neg Hx     ROS: Review of Systems  Gastrointestinal: Negative for nausea and vomiting.  Musculoskeletal:       Negative for muscle weakness.  Endo/Heme/Allergies:       Positive for polyphagia. Negative for hypoglycemia.    PHYSICAL EXAM: Blood pressure 95/60, pulse 63, temperature 98.1 F (36.7 C), temperature source Oral, height 5\' 6"  (1.676 m), weight 159 lb (72.1 kg), SpO2 97 %. Body mass index is 25.66 kg/m. Physical Exam Vitals signs reviewed.  Constitutional:      Appearance: Normal appearance. She is obese.  Cardiovascular:     Rate and Rhythm: Normal rate.  Pulmonary:  Effort: Pulmonary effort is normal.  Musculoskeletal: Normal range of motion.  Skin:    General: Skin is warm and dry.  Neurological:     Mental Status: She is alert and oriented to person, place, and time.  Psychiatric:        Mood and Affect: Mood normal.        Behavior: Behavior normal.     RECENT LABS AND TESTS: BMET    Component Value Date/Time   NA 141 04/05/2018 1149   K 4.1 04/05/2018 1149   CL 99 04/05/2018 1149   CO2 27 04/05/2018 1149   GLUCOSE 98 04/05/2018 1149   BUN 14 04/05/2018 1149   CREATININE 0.79 04/05/2018 1149   CALCIUM 9.6 04/05/2018 1149   GFRNONAA 82 04/05/2018 1149   GFRAA 94 04/05/2018 1149   Lab Results  Component Value Date   HGBA1C 5.9 (H) 04/05/2018   HGBA1C 5.8 (H) 11/20/2017   HGBA1C 5.9 (H) 08/15/2017   HGBA1C 5.9 (H) 06/22/2017   HGBA1C 5.8 (H) 04/08/2016   Lab Results  Component Value Date   INSULIN 7.8 04/05/2018   INSULIN 8.5 11/20/2017   INSULIN 11.7 08/15/2017   CBC    Component Value Date/Time   WBC 4.9 04/05/2018 1149    RBC 4.67 04/05/2018 1149   HGB 13.8 04/05/2018 1149   HCT 40.8 04/05/2018 1149   PLT 365 06/03/2015 1045   MCV 87 04/05/2018 1149   MCH 29.6 04/05/2018 1149   MCHC 33.8 04/05/2018 1149   RDW 13.6 04/05/2018 1149   LYMPHSABS 1.7 04/05/2018 1149   EOSABS 0.4 04/05/2018 1149   BASOSABS 0.0 04/05/2018 1149   Iron/TIBC/Ferritin/ %Sat No results found for: IRON, TIBC, FERRITIN, IRONPCTSAT Lipid Panel     Component Value Date/Time   CHOL 205 (H) 04/05/2018 1149   TRIG 151 (H) 04/05/2018 1149   HDL 34 (L) 04/05/2018 1149   CHOLHDL 6.0 (H) 06/22/2017 0826   LDLCALC 141 (H) 04/05/2018 1149   Hepatic Function Panel     Component Value Date/Time   PROT 7.0 04/05/2018 1149   ALBUMIN 4.2 04/05/2018 1149   AST 15 04/05/2018 1149   ALT 12 04/05/2018 1149   ALKPHOS 89 04/05/2018 1149   BILITOT 0.3 04/05/2018 1149      Component Value Date/Time   TSH 3.310 04/05/2018 1149   TSH 5.730 (H) 11/20/2017 0939   TSH 3.810 08/15/2017 0940   Results for Rebecca Rogers, FACER (MRN [id number]) as of 05/30/2018 14:32  Ref. Range 04/05/2018 11:49  Vitamin D, 25-Hydroxy Latest Ref Range: 30.0 - 100.0 ng/mL 76.3    OBESITY BEHAVIORAL INTERVENTION VISIT  Today's visit was # 13   Starting weight: 180 lbs Starting date: 08/15/17 Today's weight : Weight: 159 lb (72.1 kg)  Today's date: 05/30/2018 Total lbs lost to date: 21    05/30/2018  Height 5\' 6"  (1.676 m)  Weight 159 lb (72.1 kg)  BMI (Calculated) 25.68  BLOOD PRESSURE - SYSTOLIC 95  BLOOD PRESSURE - DIASTOLIC 60   Body Fat % 95.2 %  Total Body Water (lbs) 68.4 lbs   ASK: We discussed the diagnosis of obesity with Rebecca Rogers today and Rebecca Rogers agreed to give [name] permission to discuss obesity behavioral modification therapy today.  ASSESS: Deneka has the diagnosis of obesity and her BMI today is 25.6. Rebecca Rogers is in the action stage of change.   ADVISE: Rebecca Rogers was educated on the multiple health risks of obesity as well as the benefit of  weight  loss to improve her health. She was advised of the need for long term treatment and the importance of lifestyle modifications to improve her current health and to decrease her risk of future health problems.  AGREE: Multiple dietary modification options and treatment options were discussed and Rebecca Rogers agreed to follow the recommendations documented in the above note.  ARRANGE: Rebecca Rogers was educated on the importance of frequent visits to treat obesity as outlined per CMS and USPSTF guidelines and agreed to schedule her next follow up appointment today.  IMarcille Blanco, CMA, am acting as transcriptionist for Starlyn Skeans, MD I have reviewed the above documentation for accuracy and completeness, and I agree with the above. -Dennard Nip, MD

## 2018-06-19 ENCOUNTER — Other Ambulatory Visit: Payer: Self-pay

## 2018-06-19 MED ORDER — TRIAMTERENE-HCTZ 37.5-25 MG PO TABS: 1.0000 | ORAL_TABLET | Freq: Every day | ORAL | 0 refills | Status: DC

## 2018-06-26 ENCOUNTER — Encounter: Payer: 59 | Admitting: Obstetrics and Gynecology

## 2018-07-02 ENCOUNTER — Encounter (INDEPENDENT_AMBULATORY_CARE_PROVIDER_SITE_OTHER): Payer: Self-pay | Admitting: Family Medicine

## 2018-07-02 ENCOUNTER — Other Ambulatory Visit: Payer: Self-pay

## 2018-07-02 ENCOUNTER — Ambulatory Visit (INDEPENDENT_AMBULATORY_CARE_PROVIDER_SITE_OTHER): Payer: 59 | Admitting: Family Medicine

## 2018-07-02 VITALS — BP 115/69 | HR 68 | Temp 98.1°F | Ht 66.0 in | Wt 159.0 lb

## 2018-07-02 DIAGNOSIS — Z6830 Body mass index (BMI) 30.0-30.9, adult: Secondary | ICD-10-CM

## 2018-07-02 DIAGNOSIS — Z9189 Other specified personal risk factors, not elsewhere classified: Secondary | ICD-10-CM | POA: Diagnosis not present

## 2018-07-02 DIAGNOSIS — E669 Obesity, unspecified: Secondary | ICD-10-CM

## 2018-07-02 DIAGNOSIS — R7303 Prediabetes: Secondary | ICD-10-CM | POA: Diagnosis not present

## 2018-07-02 MED ORDER — METFORMIN HCL 500 MG PO TABS: 500.0000 mg | ORAL_TABLET | Freq: Every day | ORAL | 0 refills | Status: DC

## 2018-07-02 NOTE — Progress Notes (Signed)
Office: [phone]  /  Fax: [phone]   HPI:   Chief Complaint: OBESITY Rebecca Rogers is here to discuss her progress with her obesity treatment plan. She is on the Category 2 plan and is following her eating plan approximately 50 % of the time. She states she is exercising 0 minutes 0 times per week. Rebecca Rogers hasn't been journaling often and she is mostly controlling her portions and making smarter food choices. She has traveled and she increased eating out, but she has been mindful and she has been working on increasing lean protein. Her weight is 159 lb (72.1 kg) today and she has maintained weight since her last visit. She has lost 21 lbs since starting treatment with [name].  Pre-Diabetes Rebecca Rogers has a diagnosis of prediabetes based on her elevated Hgb A1c and was informed this puts her at greater risk of developing diabetes. Rebecca Rogers started metformin, and she noted mild GI upset for a couple of days, and then it resolved. She continues to work on diet and exercise to decrease risk of diabetes.   At risk for diabetes Rebecca Rogers is at higher than average risk for developing diabetes due to her obesity and prediabetes. She currently denies polyuria or polydipsia.  ASSESSMENT AND PLAN:  Prediabetes - Plan: metFORMIN (GLUCOPHAGE) 500 MG tablet  At risk for diabetes mellitus  Class 1 obesity with serious comorbidity and body mass index (BMI) of 30.0 to 30.9 in adult, unspecified obesity type  PLAN:  Pre-Diabetes Rebecca Rogers will continue to work on weight loss, exercise, and decreasing simple carbohydrates in her diet to help decrease the risk of diabetes. We dicussed metformin including benefits and risks. She was informed that eating too many simple carbohydrates or too many calories at one sitting increases the likelihood of GI side effects. Rebecca Rogers agrees to continue metformin 500 mg daily with breakfast #30 with no refills and follow up with [name] as directed to monitor her progress.  Diabetes risk  counselling Rebecca Rogers was given extended (15 minutes) diabetes prevention counseling today. She is 63 y.o. female and has risk factors for diabetes including obesity and prediabetes. We discussed intensive lifestyle modifications today with an emphasis on weight loss as well as increasing exercise and decreasing simple carbohydrates in her diet.  Obesity Rebecca Rogers is currently in the action stage of change. As such, her goal is to continue with weight loss efforts She has agreed to keep a food journal with 1300 to 1400 calories and 90+ grams of protein daily Rebecca Rogers has been instructed to work up to a goal of 150 minutes of combined cardio and strengthening exercise per week for weight loss and overall health benefits. We discussed the following Behavioral Modification Strategies today: increasing lean protein intake, decreasing simple carbohydrates and work on meal planning and easy cooking plans  Rebecca Rogers has agreed to follow up with our clinic in 4 weeks. She was informed of the importance of frequent follow up visits to maximize her success with intensive lifestyle modifications for her multiple health conditions.  ALLERGIES: No Known Allergies  MEDICATIONS: Current Outpatient Medications on File Prior to Visit  Medication Sig Dispense Refill  . Cholecalciferol (VITAMIN D) 50 MCG (2000 UT) tablet Take 1 tablet (2,000 Units total) by mouth daily. 30 tablet 0  . levothyroxine (SYNTHROID) 50 MCG tablet Take 0.5 tablets (25 mcg total) by mouth daily before breakfast. 90 tablet 1  . metFORMIN (GLUCOPHAGE) 500 MG tablet Take 1 tablet (500 mg total) by mouth daily with breakfast. 30 tablet 0  .  nystatin-triamcinolone (MYCOLOG II) cream Apply 1 application topically 2 (two) times daily. 30 g 1  . sertraline (ZOLOFT) 50 MG tablet Take 1.5 tablets (75 mg total) by mouth daily. 1 & 1/2 tab daily 135 tablet 0  . triamterene-hydrochlorothiazide (MAXZIDE-25) 37.5-25 MG tablet Take 1 tablet by mouth daily. 90  tablet 0   No current facility-administered medications on file prior to visit.     PAST MEDICAL HISTORY: Past Medical History:  Diagnosis Date  . Adenomyosis   . Anxiety   . Cyst of right breast    4-5 oclock 1/2 cm  . HLD (hyperlipidemia)   . Hypertension    bordeline  . Hypothyroidism   . Increased BMI     PAST SURGICAL HISTORY: Past Surgical History:  Procedure Laterality Date  . ABDOMINAL HYSTERECTOMY     lsh- adenomysis and fibroids  . BREAST BIOPSY Right 2016   NEG  . COLONOSCOPY WITH PROPOFOL N/A 06/05/2015   Procedure: COLONOSCOPY WITH PROPOFOL;  Surgeon: Lucilla Lame, MD;  Location: [facility];  Service: Endoscopy;  Laterality: N/A;  . COLONOSCOPY WITH PROPOFOL N/A 12/04/2015   Procedure: COLONOSCOPY WITH PROPOFOL;  Surgeon: Lucilla Lame, MD;  Location: [facility];  Service: Endoscopy;  Laterality: N/A;  . LAPAROSCOPIC SALPINGOOPHERECTOMY     prophylactic  . POLYPECTOMY  06/05/2015   Procedure: POLYPECTOMY;  Surgeon: Lucilla Lame, MD;  Location: [facility];  Service: Endoscopy;;  . POLYPECTOMY  12/04/2015   Procedure: POLYPECTOMY;  Surgeon: Lucilla Lame, MD;  Location: [place] [facility];  Service: Endoscopy;;  . TONSILLECTOMY  1965    SOCIAL HISTORY: Social History   Tobacco Use  . Smoking status: Never Smoker  . Smokeless tobacco: Never Used  Substance Use Topics  . Alcohol use: No  . Drug use: No    FAMILY HISTORY: Family History  Problem Relation Age of Onset  . Ovarian cancer Mother   . Prostate cancer Father   . Breast cancer Neg Hx   . Colon cancer Neg Hx   . Diabetes Neg Hx   . Heart disease Neg Hx     ROS: Review of Systems  Constitutional: Negative for weight loss.  Gastrointestinal: Positive for diarrhea and nausea.  Genitourinary: Negative for frequency.  Endo/Heme/Allergies: Negative for polydipsia.    PHYSICAL EXAM: Blood pressure 115/69, pulse 68, temperature 98.1 F (36.7 C), height 5\' 6"  (1.676 m),  weight 159 lb (72.1 kg), SpO2 99 %. Body mass index is 25.66 kg/m. Physical Exam Vitals signs reviewed.  Constitutional:      Appearance: Normal appearance. She is well-developed. She is obese.  Cardiovascular:     Rate and Rhythm: Normal rate.  Pulmonary:     Effort: Pulmonary effort is normal.  Musculoskeletal: Normal range of motion.  Skin:    General: Skin is warm and dry.  Neurological:     Mental Status: She is alert and oriented to person, place, and time.  Psychiatric:        Mood and Affect: Mood normal.        Behavior: Behavior normal.     RECENT LABS AND TESTS: BMET    Component Value Date/Time   NA 141 04/05/2018 1149   K 4.1 04/05/2018 1149   CL 99 04/05/2018 1149   CO2 27 04/05/2018 1149   GLUCOSE 98 04/05/2018 1149   BUN 14 04/05/2018 1149   CREATININE 0.79 04/05/2018 1149   CALCIUM 9.6 04/05/2018 1149   GFRNONAA 82 04/05/2018 1149   GFRAA  94 04/05/2018 1149   Lab Results  Component Value Date   HGBA1C 5.9 (H) 04/05/2018   HGBA1C 5.8 (H) 11/20/2017   HGBA1C 5.9 (H) 08/15/2017   HGBA1C 5.9 (H) 06/22/2017   HGBA1C 5.8 (H) 04/08/2016   Lab Results  Component Value Date   INSULIN 7.8 04/05/2018   INSULIN 8.5 11/20/2017   INSULIN 11.7 08/15/2017   CBC    Component Value Date/Time   WBC 4.9 04/05/2018 1149   RBC 4.67 04/05/2018 1149   HGB 13.8 04/05/2018 1149   HCT 40.8 04/05/2018 1149   PLT 365 06/03/2015 1045   MCV 87 04/05/2018 1149   MCH 29.6 04/05/2018 1149   MCHC 33.8 04/05/2018 1149   RDW 13.6 04/05/2018 1149   LYMPHSABS 1.7 04/05/2018 1149   EOSABS 0.4 04/05/2018 1149   BASOSABS 0.0 04/05/2018 1149   Iron/TIBC/Ferritin/ %Sat No results found for: IRON, TIBC, FERRITIN, IRONPCTSAT Lipid Panel     Component Value Date/Time   CHOL 205 (H) 04/05/2018 1149   TRIG 151 (H) 04/05/2018 1149   HDL 34 (L) 04/05/2018 1149   CHOLHDL 6.0 (H) 06/22/2017 0826   LDLCALC 141 (H) 04/05/2018 1149   Hepatic Function Panel     Component Value  Date/Time   PROT 7.0 04/05/2018 1149   ALBUMIN 4.2 04/05/2018 1149   AST 15 04/05/2018 1149   ALT 12 04/05/2018 1149   ALKPHOS 89 04/05/2018 1149   BILITOT 0.3 04/05/2018 1149      Component Value Date/Time   TSH 3.310 04/05/2018 1149   TSH 5.730 (H) 11/20/2017 0939   TSH 3.810 08/15/2017 0940    Results for Rebecca Rogers, Rebecca Rogers (MRN [id number]) as of 07/02/2018 15:35  Ref. Range 04/05/2018 11:49  Vitamin D, 25-Hydroxy Latest Ref Range: 30.0 - 100.0 ng/mL 76.3    OBESITY BEHAVIORAL INTERVENTION VISIT  Today's visit was # 14   Starting weight: 180 lbs Starting date: 08/15/2017 Today's weight : 159 lbs Today's date: 07/02/2018 Total lbs lost to date: 21    07/02/2018  Height 5\' 6"  (1.676 m)  Weight 159 lb (72.1 kg)  BMI (Calculated) 25.68  BLOOD PRESSURE - SYSTOLIC 283  BLOOD PRESSURE - DIASTOLIC 69   Body Fat % 66.2 %  Total Body Water (lbs) 66.81 lbs    ASK: We discussed the diagnosis of obesity with Deland Pretty today and Zola agreed to give [name] permission to discuss obesity behavioral modification therapy today.  ASSESS: Malaiya has the diagnosis of obesity and her BMI today is 25.68 Jameshia is in the action stage of change   ADVISE: Nanda was educated on the multiple health risks of obesity as well as the benefit of weight loss to improve her health. She was advised of the need for long term treatment and the importance of lifestyle modifications to improve her current health and to decrease her risk of future health problems.  AGREE: Multiple dietary modification options and treatment options were discussed and  Livvy agreed to follow the recommendations documented in the above note.  ARRANGE: Chonda was educated on the importance of frequent visits to treat obesity as outlined per CMS and USPSTF guidelines and agreed to schedule her next follow up appointment today.  I, Doreene Nest, am acting as transcriptionist for Dennard Nip, MD I have reviewed the above  documentation for accuracy and completeness, and I agree with the above. -Dennard Nip, MD

## 2018-07-17 ENCOUNTER — Encounter: Payer: Self-pay | Admitting: Obstetrics and Gynecology

## 2018-07-17 ENCOUNTER — Other Ambulatory Visit: Payer: Self-pay

## 2018-07-17 ENCOUNTER — Ambulatory Visit (INDEPENDENT_AMBULATORY_CARE_PROVIDER_SITE_OTHER): Payer: 59 | Admitting: Obstetrics and Gynecology

## 2018-07-17 ENCOUNTER — Other Ambulatory Visit (HOSPITAL_COMMUNITY)
Admission: RE | Admit: 2018-07-17 | Discharge: 2018-07-17 | Disposition: A | Discharge status: Home / Self Care | Payer: 59 | Source: Ambulatory Visit | Attending: Obstetrics and Gynecology | Admitting: Obstetrics and Gynecology

## 2018-07-17 VITALS — BP 128/64 | HR 90 | Ht 66.0 in | Wt 164.0 lb

## 2018-07-17 DIAGNOSIS — F419 Anxiety disorder, unspecified: Secondary | ICD-10-CM | POA: Diagnosis not present

## 2018-07-17 DIAGNOSIS — K641 Second degree hemorrhoids: Secondary | ICD-10-CM

## 2018-07-17 DIAGNOSIS — Z124 Encounter for screening for malignant neoplasm of cervix: Secondary | ICD-10-CM | POA: Diagnosis not present

## 2018-07-17 DIAGNOSIS — Z01419 Encounter for gynecological examination (general) (routine) without abnormal findings: Secondary | ICD-10-CM | POA: Diagnosis not present

## 2018-07-17 DIAGNOSIS — Z1239 Encounter for other screening for malignant neoplasm of breast: Secondary | ICD-10-CM

## 2018-07-17 MED ORDER — SERTRALINE HCL 50 MG PO TABS: 75.0000 mg | ORAL_TABLET | Freq: Every day | ORAL | 0 refills | Status: DC

## 2018-07-17 MED ORDER — HYDROCORTISONE ACETATE 25 MG RE SUPP: 25.0000 mg | Freq: Two times a day (BID) | RECTAL | 1 refills | Status: AC

## 2018-07-17 NOTE — Progress Notes (Signed)
Patient comes in today for physical. Patient states she is having some rectal pain.

## 2018-07-17 NOTE — Progress Notes (Signed)
HPI:      [name] is a 63 y.o. G0P0000 who LMP was No LMP recorded. Patient has had a hysterectomy.  Subjective:   She presents today for her annual examination.  She had a sudden onset of rectal pain over the last few days.  She states her anal area is uncomfortable to touch and definitely has significant pain with bowel movements.  She reports no rectal bleeding.  She has pain in general not just with bowel movements. 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  She is scheduled for a follow-up Pap smear today. She has been taking Zoloft for anxiety/depression and is doing well-would like to continue. She has had significant weight loss -intended. She says she is very happy at her new job and much more content.    Hx: The following portions of the patient's history were reviewed and updated as appropriate:             She  has a past medical history of Adenomyosis, Anxiety, Cyst of right breast, HLD (hyperlipidemia), Hypertension, Hypothyroidism, and Increased BMI. She does not have any pertinent problems on file. She  has a past surgical history that includes Tonsillectomy (1965); Laparoscopic salpingoopherectomy; Abdominal hysterectomy; Colonoscopy with propofol (N/A, 06/05/2015); polypectomy (06/05/2015); Colonoscopy with propofol (N/A, 12/04/2015); polypectomy (12/04/2015); and Breast biopsy (Right, 2016). Her family history includes Ovarian cancer in her mother; Prostate cancer in her father. She  reports that she has never smoked. She has never used smokeless tobacco. She reports that she does not drink alcohol or use drugs. She has a current medication list which includes the following prescription(s): vitamin d, levothyroxine, metformin, sertraline, triamterene-hydrochlorothiazide, hydrocortisone, and nystatin-triamcinolone. She has No Known Allergies.       Review of Systems:  Review of Systems  Constitutional: Denied constitutional  symptoms, night sweats, recent illness, fatigue, fever, insomnia and weight loss.  Eyes: Denied eye symptoms, eye pain, photophobia, vision change and visual disturbance.  Ears/Nose/Throat/Neck: Denied ear, nose, throat or neck symptoms, hearing loss, nasal discharge, sinus congestion and sore throat.  Cardiovascular: Denied cardiovascular symptoms, arrhythmia, chest pain/pressure, edema, exercise intolerance, orthopnea and palpitations.  Respiratory: Denied pulmonary symptoms, asthma, pleuritic pain, productive sputum, cough, dyspnea and wheezing.  Gastrointestinal: Denied, gastro-esophageal reflux, melena, nausea and vomiting.  Genitourinary:  Significant rectal perianal pain of sudden onset.  Musculoskeletal: Denied musculoskeletal symptoms, stiffness, swelling, muscle weakness and myalgia.  Dermatologic: Denied dermatology symptoms, rash and scar.  Neurologic: Denied neurology symptoms, dizziness, headache, neck pain and syncope.  Psychiatric: Denied psychiatric symptoms, anxiety and depression.  Endocrine: Denied endocrine symptoms including hot flashes and night sweats.   Meds:   Current Outpatient Medications on File Prior to Visit  Medication Sig Dispense Refill  . Cholecalciferol (VITAMIN D) 50 MCG (2000 UT) tablet Take 1 tablet (2,000 Units total) by mouth daily. 30 tablet 0  . levothyroxine (SYNTHROID) 50 MCG tablet Take 0.5 tablets (25 mcg total) by mouth daily before breakfast. 90 tablet 1  . metFORMIN (GLUCOPHAGE) 500 MG tablet Take 1 tablet (500 mg total) by mouth daily with breakfast. 30 tablet 0  . triamterene-hydrochlorothiazide (MAXZIDE-25) 37.5-25 MG tablet Take 1 tablet by mouth daily. 90 tablet 0  . nystatin-triamcinolone (MYCOLOG II) cream Apply 1 application topically 2 (two) times daily. 30 g 1   No current facility-administered medications on file prior to visit.     Objective:     Vitals:   07/17/18 1333  BP: 128/64  Pulse: 90  Physical  examination General NAD, Conversant  HEENT Atraumatic; Op clear with mmm.  Normo-cephalic. Pupils reactive. Anicteric sclerae  Thyroid/Neck Smooth without nodularity or enlargement. Normal ROM.  Neck Supple.  Skin No rashes, lesions or ulceration. Normal palpated skin turgor. No nodularity.  Breasts: No masses or discharge.  Symmetric.  No axillary adenopathy.  Lungs: Clear to auscultation.No rales or wheezes. Normal Respiratory effort, no retractions.  Heart: NSR.  No murmurs or rubs appreciated. No periferal edema  Abdomen: Soft.  Non-tender.  No masses.  No HSM. No hernia  Extremities: Moves all appropriately.  Normal ROM for age. No lymphadenopathy.  Neuro: Oriented to PPT.  Normal mood. Normal affect.     Pelvic:   Vulva: Normal appearance.  No lesions.   Vagina: No lesions or abnormalities noted.  Support: Normal pelvic support.  Urethra No masses tenderness or scarring.  Meatus Normal size without lesions or prolapse.  Cervix:  Normal appearance-no lesions noted  Anus:  Enlarged hemorrhoid noted -does not appear to be thrombosed.  Perineum: Normal exam.  No lesions.        Bimanual   Uterus: Surgically absent   Adnexae: No masses.  Non-tender to palpation.  Cul-de-sac: Negative for abnormality.     Assessment:    G0P0000 Patient Active Problem List   Diagnosis Date Noted  . Vitamin D deficiency 01/11/2018  . Other fatigue 08/15/2017  . Shortness of breath on exertion 08/15/2017  . Hyperglycemia 08/15/2017  . Greater trochanteric bursitis of right hip 04/18/2017  . Right hip pain 03/06/2017  . Radiculitis of right cervical region 02/06/2017  . 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 Perineal folliculitis 11/94/1740  . Hypothyroidism 04/14/2016  . Joint pain 04/14/2016  . Hx of colonic polyps   . Benign neoplasm of transverse colon   . Personal history of colonic polyps   . Benign neoplasm of ascending colon   . Benign neoplasm of cecum   . Benign neoplasm of sigmoid colon    . Anxiety 04/07/2015  . Status post bilateral salpingo-oophorectomy (BSO) 04/07/2015  . Status post laparoscopic supracervical hysterectomy 04/07/2015  . Family history of ovarian cancer 04/07/2015  . Increased BMI 04/07/2015  . Essential hypertension 04/07/2015     1. Well woman exam with routine gynecological exam   2. Screening for breast cancer   3. Screening for cervical cancer   4. Grade II hemorrhoids   5. Anxiety     Hemorrhoid likely causing anal pain.    Plan:    Basic Screening Recommendations The basic screening recommendations for asymptomatic women were discussed with the patient during her visit.  The age-appropriate recommendations were discussed with her and the rational for the tests reviewed.  When I am informed by the patient that another primary care physician has previously obtained the age-appropriate tests and they are up-to-date, only outstanding tests are ordered and referrals given as necessary.  Abnormal results of tests will be discussed with her when all of her results are completed.         1.  Anusol HC for anal pain for 10 days as directed then Preparation H.  Keep stool soft using fiber laxatives and increased fluid intake.  2.  Pap smear performed for follow-up of CIN-1-if this is abnormal consider colposcopy.  3.  Continue Zoloft for anxiety/depression  4.  Mammogram ordered Orders Orders Placed This Encounter  Procedures  . MM 3D SCREEN BREAST BILATERAL     Meds ordered this encounter  Medications  .  hydrocortisone (ANUSOL-HC) 25 MG suppository    Sig: Place 1 suppository (25 mg total) rectally 2 (two) times daily for 10 days.    Dispense:  20 suppository    Refill:  1  . sertraline (ZOLOFT) 50 MG tablet    Sig: Take 1.5 tablets (75 mg total) by mouth daily. 1 & 1/2 tab daily    Dispense:  135 tablet    Refill:  0        F/U  Return in about 1 year (around 07/17/2019) for Annual Physical, We will contact her with any abnormal test  results.  Finis Bud, M.D. 07/17/2018 2:37 PM

## 2018-07-20 ENCOUNTER — Encounter: Payer: 59 | Admitting: Obstetrics and Gynecology

## 2018-07-20 LAB — CYTOLOGY - PAP: Diagnosis: NEGATIVE

## 2018-07-30 ENCOUNTER — Encounter (INDEPENDENT_AMBULATORY_CARE_PROVIDER_SITE_OTHER): Payer: Self-pay | Admitting: Family Medicine

## 2018-07-30 ENCOUNTER — Ambulatory Visit (INDEPENDENT_AMBULATORY_CARE_PROVIDER_SITE_OTHER): Payer: 59 | Admitting: Family Medicine

## 2018-07-30 ENCOUNTER — Other Ambulatory Visit: Payer: Self-pay

## 2018-07-30 VITALS — BP 105/59 | HR 67 | Temp 97.7°F | Ht 66.0 in | Wt 158.0 lb

## 2018-07-30 DIAGNOSIS — Z9189 Other specified personal risk factors, not elsewhere classified: Secondary | ICD-10-CM

## 2018-07-30 DIAGNOSIS — R7303 Prediabetes: Secondary | ICD-10-CM

## 2018-07-30 DIAGNOSIS — E559 Vitamin D deficiency, unspecified: Secondary | ICD-10-CM

## 2018-07-30 DIAGNOSIS — E669 Obesity, unspecified: Secondary | ICD-10-CM

## 2018-07-30 DIAGNOSIS — E7849 Other hyperlipidemia: Secondary | ICD-10-CM | POA: Diagnosis not present

## 2018-07-30 DIAGNOSIS — Z6830 Body mass index (BMI) 30.0-30.9, adult: Secondary | ICD-10-CM

## 2018-07-30 MED ORDER — METFORMIN HCL 500 MG PO TABS: 500.0000 mg | ORAL_TABLET | Freq: Every day | ORAL | 0 refills | Status: DC

## 2018-07-30 NOTE — Progress Notes (Signed)
Office: [phone]  /  Fax: [phone]   HPI:   Chief Complaint: OBESITY Rebecca Rogers is here to discuss her progress with her obesity treatment plan. She is on the Category 2 plan and is following her eating plan approximately 50 % of the time. She states she is exercising 0 minutes 0 times per week. Rebecca Rogers continue to do well maintaining her weight. She is mostly trying to portion control and make smarter food choices, and she feels she is doing well with increased lean protein.  Her weight is 158 lb (71.7 kg) today and has had a weight loss of 1 pound over a period of 4 weeks since her last visit. She has lost 22 lbs since starting treatment with [name].  Pre-Diabetes Rebecca Rogers has a diagnosis of pre-diabetes based on her elevated Hgb A1c and was informed this puts her at greater risk of developing diabetes. She is stable on metformin, and is due for labs. She denies nausea, vomiting, or hypoglycemia. She continues to work on diet and exercise to decrease risk of diabetes.   At risk for diabetes Rebecca Rogers is at higher than average risk for developing diabetes due to her obesity and pre-diabetes. She currently denies polyuria or polydipsia.  Vitamin D Deficiency Rebecca Rogers has a diagnosis of vitamin D deficiency. She changed Vit D to 2,000 IU daily and is due for labs. She denies nausea, vomiting or muscle weakness.  Hyperlipidemia Rebecca Rogers has hyperlipidemia and has been attempting to improve her cholesterol levels with intensive lifestyle modification including a low saturated fat diet, exercise and weight loss. She denies any chest pain, claudication or myalgias.  ASSESSMENT AND PLAN:  Prediabetes - Plan: Comprehensive metabolic panel, Hemoglobin A1c, Insulin, random, metFORMIN (GLUCOPHAGE) 500 MG tablet  Vitamin D deficiency - Plan: VITAMIN D 25 Hydroxy (Vit-D Deficiency, Fractures)  Other hyperlipidemia - Plan: Lipid Panel With LDL/HDL Ratio  At risk for diabetes mellitus  Class 1 obesity  with serious comorbidity and body mass index (BMI) of 30.0 to 30.9 in adult, unspecified obesity type - Starting BMI greater then 30  PLAN:  Pre-Diabetes Rebecca Rogers will continue to work on weight loss, exercise, and decreasing simple carbohydrates in her diet to help decrease the risk of diabetes. We dicussed metformin including benefits and risks. She was informed that eating too many simple carbohydrates or too many calories at one sitting increases the likelihood of GI side effects. Audyn agrees to continue taking metformin 500 mg qd #30 and we will refill for 1 month. We will check labs today. Dezaray agrees to follow up with our clinic in 8 weeks as directed to monitor her progress.  Diabetes risk counseling Rebecca Rogers was given extended (15 minutes) diabetes prevention counseling today. She is 63 y.o. female and has risk factors for diabetes including obesity and pre-diabetes. We discussed intensive lifestyle modifications today with an emphasis on weight loss as well as increasing exercise and decreasing simple carbohydrates in her diet.  Vitamin D Deficiency Rebecca Rogers was informed that low vitamin D levels contributes to fatigue and are associated with obesity, breast, and colon cancer. Rebecca Rogers agrees to continue taking Vit D 2,000 IU daily will follow up for routine testing of vitamin D, at least 2-3 times per year. She was informed of the risk of over-replacement of vitamin D and agrees to not increase her dose unless she discusses this with [name] first. We will check labs today. Rebecca Rogers agrees to follow up with our clinic in 8 weeks.  Hyperlipidemia Rebecca Rogers was informed of  the American Heart Association Guidelines emphasizing intensive lifestyle modifications as the first line treatment for hyperlipidemia. We discussed many lifestyle modifications today in depth, and Orpha will continue to work on decreasing saturated fats such as fatty red meat, butter and many fried foods. She will also increase  vegetables and lean protein in her diet and continue to work on diet, exercise, and weight loss efforts. We will check labs today. Rebecca Rogers agrees to follow up with our clinic in 8 weeks.  Obesity Rebecca Rogers is currently in the action stage of change. As such, her goal is to maintain her weight loss for now She has agreed to portion control better and make smarter food choices, such as increase vegetables and decrease simple carbohydrates  Rebecca Rogers has been instructed to work up to a goal of 150 minutes of combined cardio and strengthening exercise per week for weight loss and overall health benefits. We discussed the following Behavioral Modification Strategies today: increasing lean protein intake, decreasing simple carbohydrates  and work on meal planning and easy cooking plans   Rebecca Rogers has agreed to follow up with our clinic in 8 weeks. She was informed of the importance of frequent follow up visits to maximize her success with intensive lifestyle modifications for her multiple health conditions.  ALLERGIES: No Known Allergies  MEDICATIONS: Current Outpatient Medications on File Prior to Visit  Medication Sig Dispense Refill   Cholecalciferol (VITAMIN D) 50 MCG (2000 UT) tablet Take 1 tablet (2,000 Units total) by mouth daily. 30 tablet 0   levothyroxine (SYNTHROID) 50 MCG tablet Take 0.5 tablets (25 mcg total) by mouth daily before breakfast. 90 tablet 1   nystatin-triamcinolone (MYCOLOG II) cream Apply 1 application topically 2 (two) times daily. 30 g 1   sertraline (ZOLOFT) 50 MG tablet Take 1.5 tablets (75 mg total) by mouth daily. 1 & 1/2 tab daily 135 tablet 0   triamterene-hydrochlorothiazide (MAXZIDE-25) 37.5-25 MG tablet Take 1 tablet by mouth daily. 90 tablet 0   No current facility-administered medications on file prior to visit.     PAST MEDICAL HISTORY: Past Medical History:  Diagnosis Date   Adenomyosis    Anxiety    Cyst of right breast    4-5 oclock 1/2 cm   HLD  (hyperlipidemia)    Hypertension    bordeline   Hypothyroidism    Increased BMI     PAST SURGICAL HISTORY: Past Surgical History:  Procedure Laterality Date   ABDOMINAL HYSTERECTOMY     lsh- adenomysis and fibroids   BREAST BIOPSY Right 2016   NEG   COLONOSCOPY WITH PROPOFOL N/A 06/05/2015   Procedure: COLONOSCOPY WITH PROPOFOL;  Surgeon: Lucilla Lame, MD;  Location: [facility];  Service: Endoscopy;  Laterality: N/A;   COLONOSCOPY WITH PROPOFOL N/A 12/04/2015   Procedure: COLONOSCOPY WITH PROPOFOL;  Surgeon: Lucilla Lame, MD;  Location: [facility];  Service: Endoscopy;  Laterality: N/A;   LAPAROSCOPIC SALPINGOOPHERECTOMY     prophylactic   POLYPECTOMY  06/05/2015   Procedure: POLYPECTOMY;  Surgeon: Lucilla Lame, MD;  Location: [facility];  Service: Endoscopy;;   POLYPECTOMY  12/04/2015   Procedure: POLYPECTOMY;  Surgeon: Lucilla Lame, MD;  Location: [facility];  Service: Endoscopy;;   TONSILLECTOMY  1965    SOCIAL HISTORY: Social History   Tobacco Use   Smoking status: Never Smoker   Smokeless tobacco: Never Used  Substance Use Topics   Alcohol use: No   Drug use: No    FAMILY HISTORY: Family History  Problem Relation Age of Onset   Ovarian cancer Mother    Prostate cancer Father    Breast cancer Neg Hx    Colon cancer Neg Hx    Diabetes Neg Hx    Heart disease Neg Hx     ROS: Review of Systems  Constitutional: Positive for weight loss.  Cardiovascular: Negative for chest pain and claudication.  Gastrointestinal: Negative for nausea and vomiting.  Genitourinary: Negative for frequency.  Musculoskeletal: Negative for myalgias.       Negative muscle weakness  Endo/Heme/Allergies: Negative for polydipsia.       Negative hypoglycemia    PHYSICAL EXAM: Blood pressure (!) 105/59, pulse 67, temperature 97.7 F (36.5 C), temperature source Oral, height 5\' 6"  (1.676 m), weight 158 lb (71.7 kg), SpO2 98 %. Body mass  index is 25.5 kg/m. Physical Exam Vitals signs reviewed.  Constitutional:      Appearance: Normal appearance. She is obese.  Cardiovascular:     Rate and Rhythm: Normal rate.     Pulses: Normal pulses.  Pulmonary:     Effort: Pulmonary effort is normal.     Breath sounds: Normal breath sounds.  Musculoskeletal: Normal range of motion.  Skin:    General: Skin is warm and dry.  Neurological:     Mental Status: She is alert and oriented to person, place, and time.  Psychiatric:        Mood and Affect: Mood normal.        Behavior: Behavior normal.     RECENT LABS AND TESTS: BMET    Component Value Date/Time   NA 141 04/05/2018 1149   K 4.1 04/05/2018 1149   CL 99 04/05/2018 1149   CO2 27 04/05/2018 1149   GLUCOSE 98 04/05/2018 1149   BUN 14 04/05/2018 1149   CREATININE 0.79 04/05/2018 1149   CALCIUM 9.6 04/05/2018 1149   GFRNONAA 82 04/05/2018 1149   GFRAA 94 04/05/2018 1149   Lab Results  Component Value Date   HGBA1C 5.9 (H) 04/05/2018   HGBA1C 5.8 (H) 11/20/2017   HGBA1C 5.9 (H) 08/15/2017   HGBA1C 5.9 (H) 06/22/2017   HGBA1C 5.8 (H) 04/08/2016   Lab Results  Component Value Date   INSULIN 7.8 04/05/2018   INSULIN 8.5 11/20/2017   INSULIN 11.7 08/15/2017   CBC    Component Value Date/Time   WBC 4.9 04/05/2018 1149   RBC 4.67 04/05/2018 1149   HGB 13.8 04/05/2018 1149   HCT 40.8 04/05/2018 1149   PLT 365 06/03/2015 1045   MCV 87 04/05/2018 1149   MCH 29.6 04/05/2018 1149   MCHC 33.8 04/05/2018 1149   RDW 13.6 04/05/2018 1149   LYMPHSABS 1.7 04/05/2018 1149   EOSABS 0.4 04/05/2018 1149   BASOSABS 0.0 04/05/2018 1149   Iron/TIBC/Ferritin/ %Sat No results found for: IRON, TIBC, FERRITIN, IRONPCTSAT Lipid Panel     Component Value Date/Time   CHOL 205 (H) 04/05/2018 1149   TRIG 151 (H) 04/05/2018 1149   HDL 34 (L) 04/05/2018 1149   CHOLHDL 6.0 (H) 06/22/2017 0826   LDLCALC 141 (H) 04/05/2018 1149   Hepatic Function Panel     Component Value  Date/Time   PROT 7.0 04/05/2018 1149   ALBUMIN 4.2 04/05/2018 1149   AST 15 04/05/2018 1149   ALT 12 04/05/2018 1149   ALKPHOS 89 04/05/2018 1149   BILITOT 0.3 04/05/2018 1149      Component Value Date/Time   TSH 3.310 04/05/2018 1149   TSH 5.730 (H) 11/20/2017  6063   TSH 3.810 08/15/2017 0940      OBESITY BEHAVIORAL INTERVENTION VISIT  Today's visit was # 15   Starting weight: 180 lbs Starting date: 08/15/17 Today's weight : 158 lbs Today's date: 07/30/2018 Total lbs lost to date: 17    ASK: We discussed the diagnosis of obesity with Rebecca Rogers today and Rebecca Rogers agreed to give [name] permission to discuss obesity behavioral modification therapy today.  ASSESS: Rebecca Rogers has the diagnosis of obesity and her BMI today is 25.51 Rebecca Rogers is in the action stage of change   ADVISE: Tierra was educated on the multiple health risks of obesity as well as the benefit of weight loss to improve her health. She was advised of the need for long term treatment and the importance of lifestyle modifications to improve her current health and to decrease her risk of future health problems.  AGREE: Multiple dietary modification options and treatment options were discussed and  Lyric agreed to follow the recommendations documented in the above note.  ARRANGE: Miley was educated on the importance of frequent visits to treat obesity as outlined per CMS and USPSTF guidelines and agreed to schedule her next follow up appointment today.  I, Rebecca Rogers, am acting as transcriptionist for Dennard Nip, MD  I have reviewed the above documentation for accuracy and completeness, and I agree with the above. -Dennard Nip, MD

## 2018-07-31 LAB — COMPREHENSIVE METABOLIC PANEL
ALT: 12 IU/L (ref 0–32)
AST: 17 IU/L (ref 0–40)
Albumin/Globulin Ratio: 2.4 — ABNORMAL HIGH (ref 1.2–2.2)
Albumin: 4 g/dL (ref 3.8–4.9)
Alkaline Phosphatase: 66 IU/L (ref 39–117)
BUN/Creatinine Ratio: 22 (ref 12–28)
BUN: 19 mg/dL (ref 8–27)
Bilirubin Total: 0.4 mg/dL (ref 0.0–1.2)
CO2: 28 mmol/L (ref 20–29)
Calcium: 9.3 mg/dL (ref 8.7–10.3)
Chloride: 99 mmol/L (ref 96–106)
Creatinine, Ser: 0.87 mg/dL (ref 0.57–1.00)
GFR calc Af Amer: 84 mL/min/{1.73_m2} (ref >59)
GFR calc non Af Amer: 73 mL/min/{1.73_m2} (ref >59)
Globulin, Total: 1.7 g/dL (ref 1.5–4.5)
Glucose: 102 mg/dL — ABNORMAL HIGH (ref 65–99)
Potassium: 4.1 mmol/L (ref 3.5–5.2)
Sodium: 141 mmol/L (ref 134–144)
Total Protein: 5.7 g/dL — ABNORMAL LOW (ref 6.0–8.5)

## 2018-07-31 LAB — LIPID PANEL WITH LDL/HDL RATIO
Cholesterol, Total: 218 mg/dL — ABNORMAL HIGH (ref 100–199)
HDL: 41 mg/dL (ref >39)
LDL Calculated: 145 mg/dL — ABNORMAL HIGH (ref 0–99)
LDl/HDL Ratio: 3.5 ratio — ABNORMAL HIGH (ref 0.0–3.2)
Triglycerides: 158 mg/dL — ABNORMAL HIGH (ref 0–149)
VLDL Cholesterol Cal: 32 mg/dL (ref 5–40)

## 2018-07-31 LAB — VITAMIN D 25 HYDROXY (VIT D DEFICIENCY, FRACTURES): Vit D, 25-Hydroxy: 35.6 ng/mL (ref 30.0–100.0)

## 2018-07-31 LAB — HEMOGLOBIN A1C
Est. average glucose Bld gHb Est-mCnc: 114 mg/dL
Hgb A1c MFr Bld: 5.6 % (ref 4.8–5.6)

## 2018-07-31 LAB — INSULIN, RANDOM: INSULIN: 6.1 u[IU]/mL (ref 2.6–24.9)

## 2018-08-01 ENCOUNTER — Other Ambulatory Visit: Payer: Self-pay | Admitting: Obstetrics and Gynecology

## 2018-08-01 DIAGNOSIS — Z09 Encounter for follow-up examination after completed treatment for conditions other than malignant neoplasm: Secondary | ICD-10-CM

## 2018-08-31 ENCOUNTER — Ambulatory Visit
Admission: RE | Admit: 2018-08-31 | Discharge: 2018-08-31 | Disposition: A | Discharge status: Home / Self Care | Payer: 59 | Source: Ambulatory Visit | Attending: Obstetrics and Gynecology | Admitting: Obstetrics and Gynecology

## 2018-08-31 DIAGNOSIS — N6011 Diffuse cystic mastopathy of right breast: Secondary | ICD-10-CM | POA: Diagnosis not present

## 2018-08-31 DIAGNOSIS — Z09 Encounter for follow-up examination after completed treatment for conditions other than malignant neoplasm: Secondary | ICD-10-CM | POA: Insufficient documentation

## 2018-09-04 ENCOUNTER — Other Ambulatory Visit (INDEPENDENT_AMBULATORY_CARE_PROVIDER_SITE_OTHER): Payer: Self-pay | Admitting: Family Medicine

## 2018-09-04 DIAGNOSIS — R7303 Prediabetes: Secondary | ICD-10-CM

## 2018-09-04 MED ORDER — METFORMIN HCL 500 MG PO TABS: 500.0000 mg | ORAL_TABLET | Freq: Every day | ORAL | 0 refills | Status: DC

## 2018-09-13 ENCOUNTER — Other Ambulatory Visit: Payer: Self-pay

## 2018-09-13 MED ORDER — TRIAMTERENE-HCTZ 37.5-25 MG PO TABS: 1.0000 | ORAL_TABLET | Freq: Every day | ORAL | 1 refills | Status: DC

## 2018-09-24 ENCOUNTER — Other Ambulatory Visit: Payer: Self-pay

## 2018-09-24 ENCOUNTER — Ambulatory Visit (INDEPENDENT_AMBULATORY_CARE_PROVIDER_SITE_OTHER): Payer: 59 | Admitting: Family Medicine

## 2018-09-24 ENCOUNTER — Encounter (INDEPENDENT_AMBULATORY_CARE_PROVIDER_SITE_OTHER): Payer: Self-pay | Admitting: Family Medicine

## 2018-09-24 VITALS — BP 94/60 | HR 71 | Temp 98.0°F | Ht 66.0 in | Wt 162.0 lb

## 2018-09-24 DIAGNOSIS — Z6830 Body mass index (BMI) 30.0-30.9, adult: Secondary | ICD-10-CM

## 2018-09-24 DIAGNOSIS — E669 Obesity, unspecified: Secondary | ICD-10-CM | POA: Diagnosis not present

## 2018-09-24 DIAGNOSIS — E782 Mixed hyperlipidemia: Secondary | ICD-10-CM

## 2018-09-24 DIAGNOSIS — E559 Vitamin D deficiency, unspecified: Secondary | ICD-10-CM

## 2018-09-24 DIAGNOSIS — Z9189 Other specified personal risk factors, not elsewhere classified: Secondary | ICD-10-CM | POA: Diagnosis not present

## 2018-09-24 DIAGNOSIS — R7303 Prediabetes: Secondary | ICD-10-CM | POA: Diagnosis not present

## 2018-09-24 MED ORDER — METFORMIN HCL 500 MG PO TABS: 500.0000 mg | ORAL_TABLET | Freq: Every day | ORAL | 0 refills | Status: DC

## 2018-09-24 MED ORDER — VITAMIN D (ERGOCALCIFEROL) 1.25 MG (50000 UNIT) PO CAPS: 50000.0000 [IU] | ORAL_CAPSULE | ORAL | 0 refills | Status: DC

## 2018-09-25 NOTE — Progress Notes (Signed)
Office: [phone]  /  Fax: [phone]   HPI:   Chief Complaint: OBESITY Rebecca Rogers is here to discuss her progress with her obesity treatment plan. She is on the portion control better and make smarter food choices, such as increase vegetables and decrease simple carbohydrates and is following her eating plan approximately 90 % of the time. She states she is exercising 0 minutes 0 times per week. Trudee was on vacation and had some celebration eating. She tried to be mindful, but gave in to extra sugar at times. [name] did meal plan, which is better than what she has done in the past.  Her weight is 162 lb (73.5 kg) today and has had a weight gain of 4 pounds over a period of 8 weeks since her last visit. She has lost 18 lbs since starting treatment with [name].  Vitamin D Deficiency Rebecca Rogers has a diagnosis of vitamin D deficiency. She changed to OTC vitamin D 2,000 IU per day and her level dropped below goal. Rebecca Rogers denies nausea, vomiting, or muscle weakness.  Hyperlipidemia (mixed) Daylani has hyperlipidemia and has been trying to improve her cholesterol levels with intensive lifestyle modification including a low saturated fat diet, exercise, and weight loss. Rebecca Rogers's ASCVD score is 3.5. Her HDL is better, but her LDL and triglycerides are still elevated. She denies chest pain.  At risk for cardiovascular disease Rebecca Rogers is at a higher than average risk for cardiovascular disease due to hyperlipidemia and obesity. She currently denies any chest pain.  Pre-Diabetes Rebecca Rogers has a diagnosis of pre-diabetes based on her elevated Hgb A1c and was informed this puts her at greater risk of developing diabetes. She is taking metformin currently and is stable on diet and exercise to decrease risk of diabetes. Her A1c has improved. She denies nausea, vomiting, or hypoglycemia.   ASSESSMENT AND PLAN:  Vitamin D deficiency - Plan: Vitamin D, Ergocalciferol, (DRISDOL) 1.25 MG (50000 UT) CAPS capsule   Mixed hyperlipidemia  Prediabetes - Plan: metFORMIN (GLUCOPHAGE) 500 MG tablet  At risk for heart disease  Class 1 obesity with serious comorbidity and body mass index (BMI) of 30.0 to 30.9 in adult, unspecified obesity type  PLAN:  Vitamin D Deficiency Rebecca Rogers was informed that low vitamin D levels contribute to fatigue and are associated with obesity, breast, and colon cancer. Rebecca Rogers agrees to restart prescription Vit D @50 ,000 IU every week #4 with no refills and will follow up for routine testing of vitamin D, at least 2-3 times per year. She was informed of the risk of over-replacement of vitamin D and agrees to not increase her dose unless she discusses this with [name] first. We will check labs in 2 months. Rebecca Rogers agrees to follow up in 8 weeks as directed.  Hyperlipidemia Rebecca Rogers was informed of the American Heart Association Guidelines emphasizing intensive lifestyle modifications as the first line treatment for hyperlipidemia. We discussed many lifestyle modifications today in depth, such as diet and exercise, as well as the eventual need for a statin. Rebecca Rogers agrees to continue to work on decreasing saturated fats such as fatty red meat, butter, and many fried foods. She will also increase vegetables and lean protein in her diet and continue to work on exercise and weight loss efforts.  Cardiovascular risk counseling Rebecca Rogers was given extended (15 minutes) coronary artery disease prevention counseling today. She is 63 y.o. female and has risk factors for heart disease including hyperlipidemia and obesity. We discussed intensive lifestyle modifications today with an emphasis on specific  weight loss instructions and strategies. Pt was also informed of the importance of increasing exercise and decreasing saturated fats to help prevent heart disease.  Pre-Diabetes Rebecca Rogers will continue to work on weight loss, exercise, and decreasing simple carbohydrates in her diet to help decrease the risk of  diabetes. She was informed that eating too many simple carbohydrates or too many calories at one sitting increases the likelihood of GI side effects. Rebecca Rogers agreed to continue metformin 500 mg qAM #30 with no refills and a prescription was written today. Rebecca Rogers agreed to follow up with [name] as directed to monitor her progress in 8 weeks.   Obesity Rebecca Rogers is currently in the action stage of change. As such, her goal is to continue with weight loss efforts. She has agreed to portion control better and make smarter food choices, such as increase vegetables, and decrease simple carbohydrates. Rebecca Rogers has been instructed to work up to a goal of 150 minutes of combined cardio and strengthening exercise per week for weight loss and overall health benefits. We discussed the following Behavioral Modification Strategies today: increasing lean protein intake and decreasing simple carbohydrates.   Rebecca Rogers has agreed to follow up with our clinic in 8 weeks for a fasting appointment. She was informed of the importance of frequent follow up visits to maximize her success with intensive lifestyle modifications for her multiple health conditions.  ALLERGIES: No Known Allergies  MEDICATIONS: Current Outpatient Medications on File Prior to Visit  Medication Sig Dispense Refill  . Cholecalciferol (VITAMIN D) 50 MCG (2000 UT) tablet Take 1 tablet (2,000 Units total) by mouth daily. 30 tablet 0  . levothyroxine (SYNTHROID) 50 MCG tablet Take 0.5 tablets (25 mcg total) by mouth daily before breakfast. 90 tablet 1  . nystatin-triamcinolone (MYCOLOG II) cream Apply 1 application topically 2 (two) times daily. 30 g 1  . sertraline (ZOLOFT) 50 MG tablet Take 1.5 tablets (75 mg total) by mouth daily. 1 & 1/2 tab daily 135 tablet 0  . triamterene-hydrochlorothiazide (MAXZIDE-25) 37.5-25 MG tablet Take 1 tablet by mouth daily. 90 tablet 1   No current facility-administered medications on file prior to visit.     PAST  MEDICAL HISTORY: Past Medical History:  Diagnosis Date  . Adenomyosis   . Anxiety   . Cyst of right breast    4-5 oclock 1/2 cm  . HLD (hyperlipidemia)   . Hypertension    bordeline  . Hypothyroidism   . Increased BMI     PAST SURGICAL HISTORY: Past Surgical History:  Procedure Laterality Date  . ABDOMINAL HYSTERECTOMY     lsh- adenomysis and fibroids  . BREAST BIOPSY Right 2016   NEG  . COLONOSCOPY WITH PROPOFOL N/A 06/05/2015   Procedure: COLONOSCOPY WITH PROPOFOL;  Surgeon: Lucilla Lame, MD;  Location: [facility];  Service: Endoscopy;  Laterality: N/A;  . COLONOSCOPY WITH PROPOFOL N/A 12/04/2015   Procedure: COLONOSCOPY WITH PROPOFOL;  Surgeon: Lucilla Lame, MD;  Location: [facility];  Service: Endoscopy;  Laterality: N/A;  . LAPAROSCOPIC SALPINGOOPHERECTOMY     prophylactic  . POLYPECTOMY  06/05/2015   Procedure: POLYPECTOMY;  Surgeon: Lucilla Lame, MD;  Location: [facility] [facility];  Service: Endoscopy;;  . POLYPECTOMY  12/04/2015   Procedure: POLYPECTOMY;  Surgeon: Lucilla Lame, MD;  Location: [facility];  Service: Endoscopy;;  . TONSILLECTOMY  1965    SOCIAL HISTORY: Social History   Tobacco Use  . Smoking status: Never Smoker  . Smokeless tobacco: Never Used  Substance Use Topics  .  Alcohol use: No  . Drug use: No    FAMILY HISTORY: Family History  Problem Relation Age of Onset  . Ovarian cancer Mother   . Prostate cancer Father   . Breast cancer Neg Hx   . Colon cancer Neg Hx   . Diabetes Neg Hx   . Heart disease Neg Hx     ROS: Review of Systems  Constitutional: Negative for weight loss.  Cardiovascular: Negative for chest pain.  Gastrointestinal: Negative for nausea and vomiting.  Musculoskeletal:       Negative for muscle weakness.  Endo/Heme/Allergies:       Negative for hypoglycemia.    PHYSICAL EXAM: Blood pressure 94/60, pulse 71, temperature 98 F (36.7 C), temperature source Oral, height 5\' 6"  (1.676 m), weight  162 lb (73.5 kg), SpO2 100 %. Body mass index is 26.15 kg/m. Physical Exam Vitals signs reviewed.  Constitutional:      Appearance: Normal appearance. She is obese.  Cardiovascular:     Rate and Rhythm: Normal rate.  Pulmonary:     Effort: Pulmonary effort is normal.  Musculoskeletal: Normal range of motion.  Skin:    General: Skin is warm and dry.  Neurological:     Mental Status: She is alert and oriented to person, place, and time.  Psychiatric:        Mood and Affect: Mood normal.        Behavior: Behavior normal.     RECENT LABS AND TESTS: BMET    Component Value Date/Time   NA 141 07/30/2018 1129   K 4.1 07/30/2018 1129   CL 99 07/30/2018 1129   CO2 28 07/30/2018 1129   GLUCOSE 102 (H) 07/30/2018 1129   BUN 19 07/30/2018 1129   CREATININE 0.87 07/30/2018 1129   CALCIUM 9.3 07/30/2018 1129   GFRNONAA 73 07/30/2018 1129   GFRAA 84 07/30/2018 1129   Lab Results  Component Value Date   HGBA1C 5.6 07/30/2018   HGBA1C 5.9 (H) 04/05/2018   HGBA1C 5.8 (H) 11/20/2017   HGBA1C 5.9 (H) 08/15/2017   HGBA1C 5.9 (H) 06/22/2017   Lab Results  Component Value Date   INSULIN 6.1 07/30/2018   INSULIN 7.8 04/05/2018   INSULIN 8.5 11/20/2017   INSULIN 11.7 08/15/2017   CBC    Component Value Date/Time   WBC 4.9 04/05/2018 1149   RBC 4.67 04/05/2018 1149   HGB 13.8 04/05/2018 1149   HCT 40.8 04/05/2018 1149   PLT 365 06/03/2015 1045   MCV 87 04/05/2018 1149   MCH 29.6 04/05/2018 1149   MCHC 33.8 04/05/2018 1149   RDW 13.6 04/05/2018 1149   LYMPHSABS 1.7 04/05/2018 1149   EOSABS 0.4 04/05/2018 1149   BASOSABS 0.0 04/05/2018 1149   Iron/TIBC/Ferritin/ %Sat No results found for: IRON, TIBC, FERRITIN, IRONPCTSAT Lipid Panel     Component Value Date/Time   CHOL 218 (H) 07/30/2018 1129   TRIG 158 (H) 07/30/2018 1129   HDL 41 07/30/2018 1129   CHOLHDL 6.0 (H) 06/22/2017 0826   LDLCALC 145 (H) 07/30/2018 1129   Hepatic Function Panel     Component Value  Date/Time   PROT 5.7 (L) 07/30/2018 1129   ALBUMIN 4.0 07/30/2018 1129   AST 17 07/30/2018 1129   ALT 12 07/30/2018 1129   ALKPHOS 66 07/30/2018 1129   BILITOT 0.4 07/30/2018 1129      Component Value Date/Time   TSH 3.310 04/05/2018 1149   TSH 5.730 (H) 11/20/2017 0939   TSH 3.810 08/15/2017 0940  Results for SHAILAH, WIEDERKEHR (MRN [id number]) as of 09/25/2018 17:12  Ref. Range 07/30/2018 11:29  Vitamin D, 25-Hydroxy Latest Ref Range: 30.0 - 100.0 ng/mL 35.6    OBESITY BEHAVIORAL INTERVENTION VISIT  Today's visit was # 16   Starting weight: 180 lbs Starting date: 08/15/17 Today's weight : Weight: 162 lb (73.5 kg)  Today's date: 09/24/2018 Total lbs lost to date: 18    09/24/2018  Height 5\' 6"  (1.676 m)  Weight 162 lb (73.5 kg)  BMI (Calculated) 26.16  BLOOD PRESSURE - SYSTOLIC 94  BLOOD PRESSURE - DIASTOLIC 60   Body Fat % 123XX123 %  Total Body Water (lbs) 67.2 lbs   ASK: We discussed the diagnosis of obesity with Deland Pretty today and Arloine agreed to give [name] permission to discuss obesity behavioral modification therapy today.  ASSESS: Ronecia has the diagnosis of obesity and her BMI today is 26.16. Zooey is in the action stage of change.   ADVISE: Winona was educated on the multiple health risks of obesity as well as the benefit of weight loss to improve her health. She was advised of the need for long term treatment and the importance of lifestyle modifications to improve her current health and to decrease her risk of future health problems.  AGREE: Multiple dietary modification options and treatment options were discussed and Kabrina agreed to follow the recommendations documented in the above note.  ARRANGE: Georgene was educated on the importance of frequent visits to treat obesity as outlined per CMS and USPSTF guidelines and agreed to schedule her next follow up appointment today.  IMarcille Blanco, CMA, am acting as transcriptionist for Starlyn Skeans, MD  I have  reviewed the above documentation for accuracy and completeness, and I agree with the above. -Dennard Nip, MD

## 2018-09-27 IMAGING — DX DG CERVICAL SPINE COMPLETE 4+V
5 series · 5 of 5 positions shown · non-contrast
Comparison: None in PACs

CLINICAL DATA: Right-sided neck pain radiating to the right arm for
the past 2 months. No known injury.

EXAM:
CERVICAL SPINE - COMPLETE 4+ VIEW

[c-spine lat]
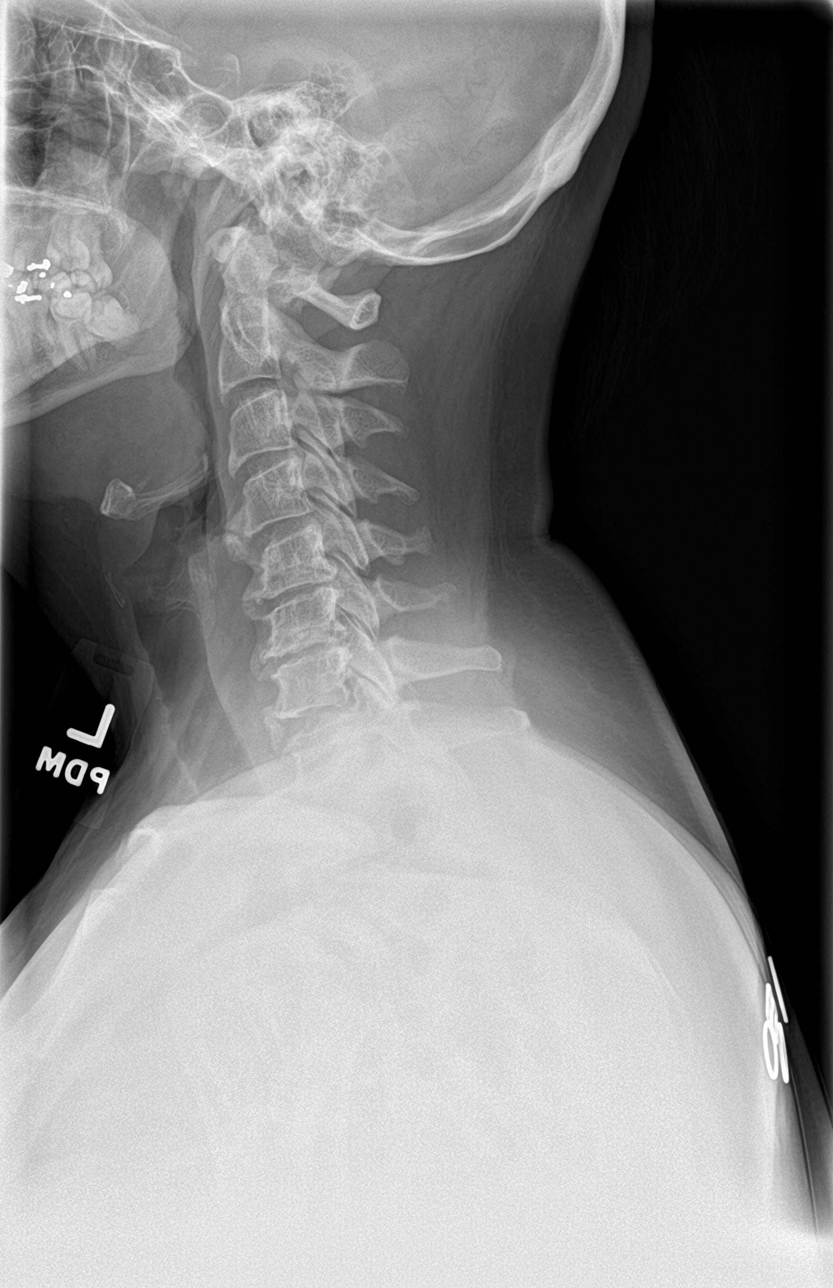

[c-spine obl (1 of 2)]
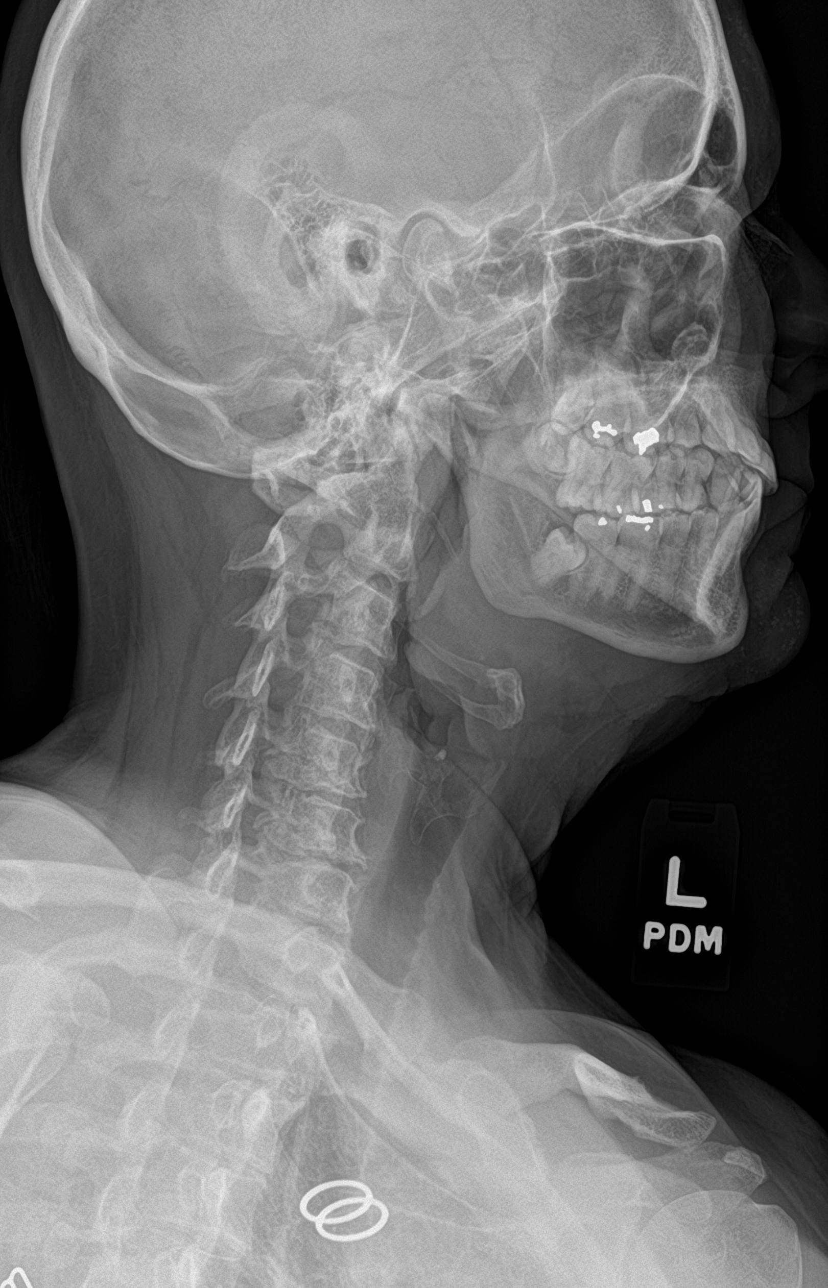

[c-spine obl (2 of 2)]
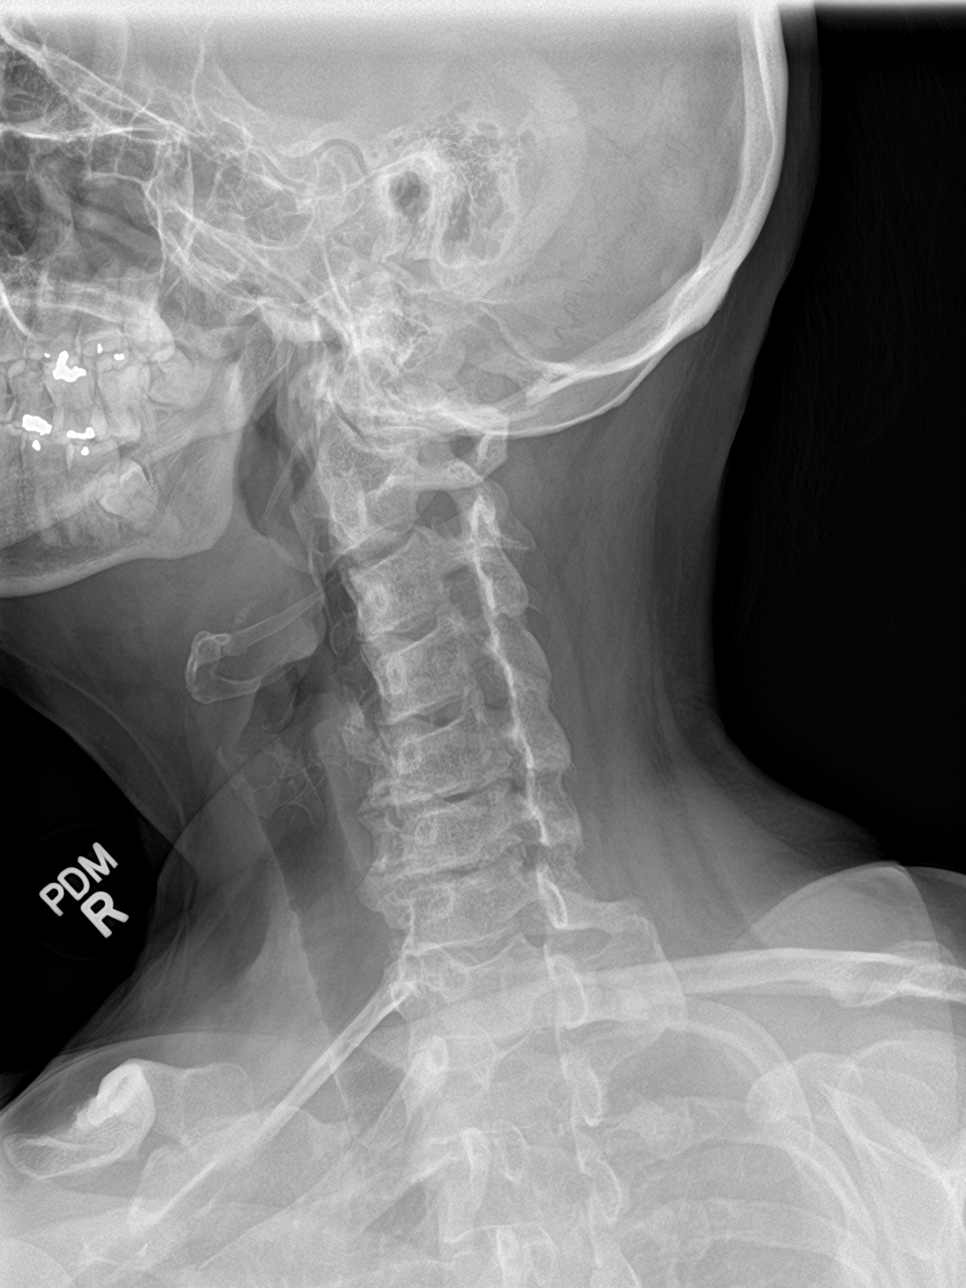

[c-spine ap]
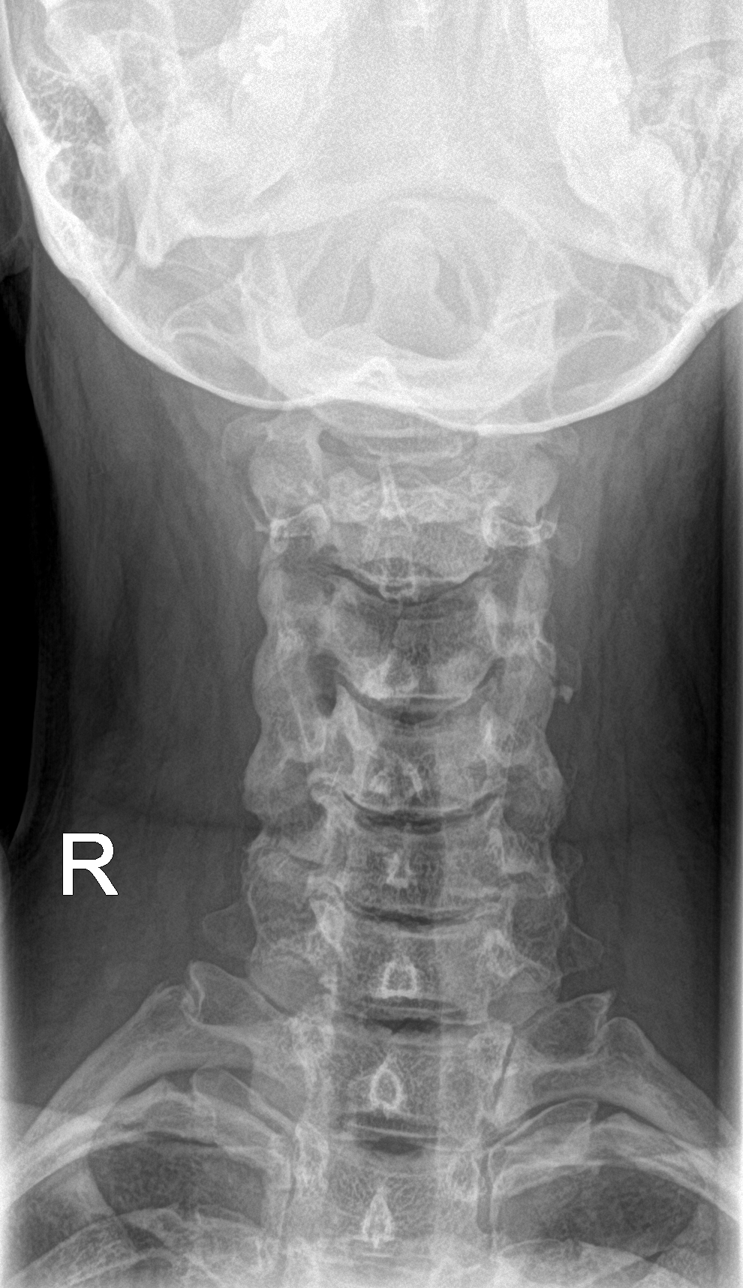

[c-spine open mouth]
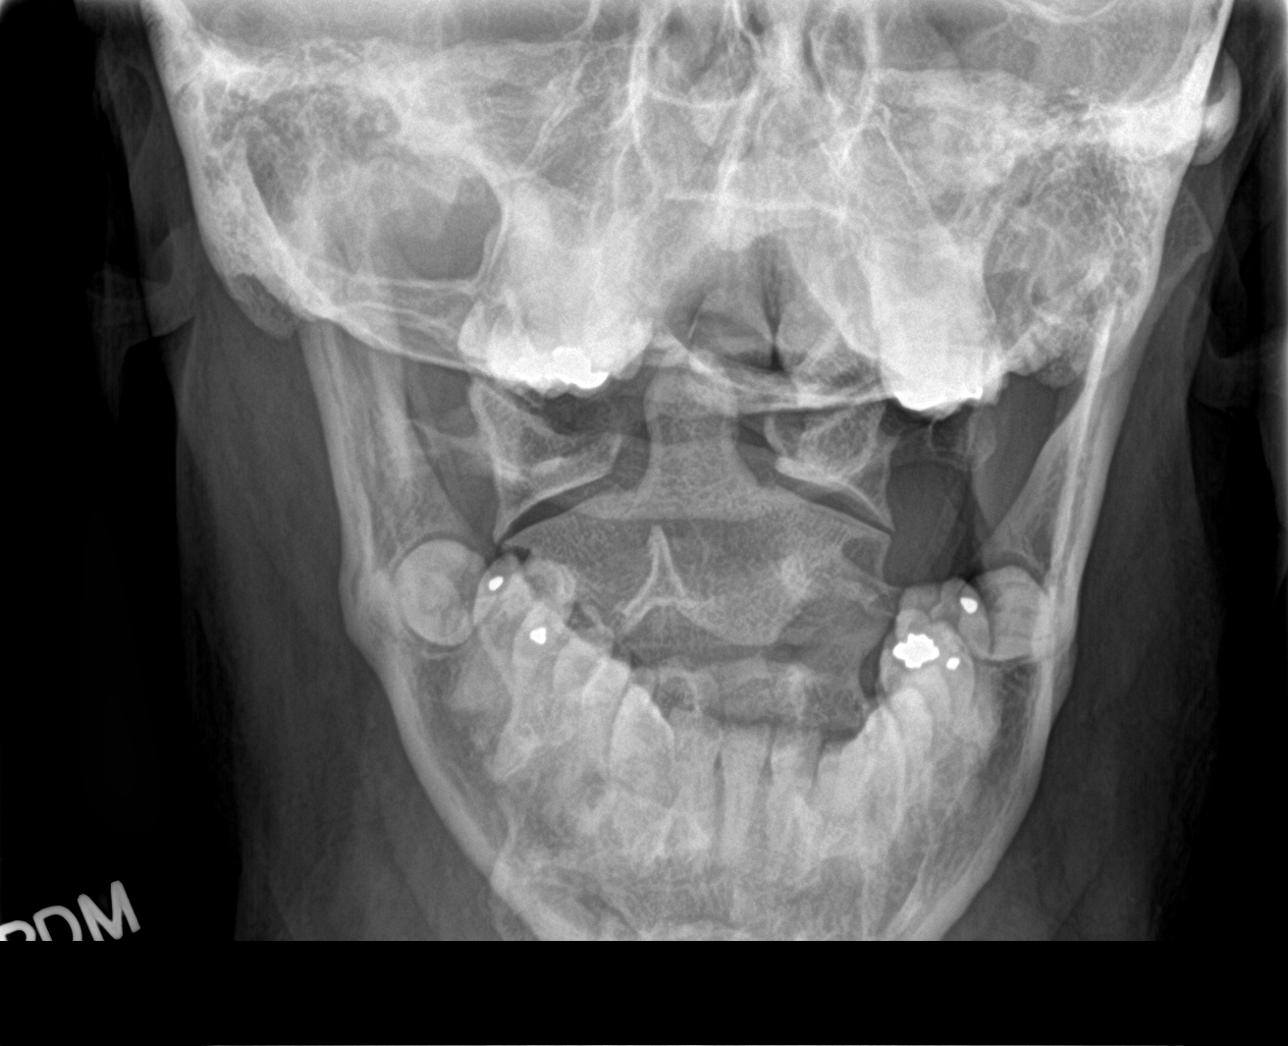

[5 of 5 positions shown; findings below may reference images not displayed]

FINDINGS: The cervical vertebral bodies are preserved in height. There is disc
space narrowing at C5-6, C6-7. There are anterior endplate
osteophytes at from C4 through T1. There is no perched facet or
spinous process fracture. There is bony encroachment upon the neural
foramina bilaterally in the lower cervical spine. The odontoid is
intact. The prevertebral soft tissue spaces are normal.
IMPRESSION: Degenerative disc disease at C5-6 and C6-7. Bilateral bony
encroachment upon the neural foramina at these levels. No
compression fracture.

## 2018-10-09 ENCOUNTER — Other Ambulatory Visit (INDEPENDENT_AMBULATORY_CARE_PROVIDER_SITE_OTHER): Payer: Self-pay | Admitting: Family Medicine

## 2018-10-09 DIAGNOSIS — R7303 Prediabetes: Secondary | ICD-10-CM

## 2018-10-23 ENCOUNTER — Encounter (INDEPENDENT_AMBULATORY_CARE_PROVIDER_SITE_OTHER): Payer: Self-pay | Admitting: Family Medicine

## 2018-10-23 ENCOUNTER — Other Ambulatory Visit (INDEPENDENT_AMBULATORY_CARE_PROVIDER_SITE_OTHER): Payer: Self-pay

## 2018-10-23 ENCOUNTER — Other Ambulatory Visit: Payer: Self-pay | Admitting: Surgical

## 2018-10-23 DIAGNOSIS — F419 Anxiety disorder, unspecified: Secondary | ICD-10-CM

## 2018-10-23 DIAGNOSIS — E559 Vitamin D deficiency, unspecified: Secondary | ICD-10-CM

## 2018-10-23 MED ORDER — VITAMIN D (ERGOCALCIFEROL) 1.25 MG (50000 UNIT) PO CAPS: 50000.0000 [IU] | ORAL_CAPSULE | ORAL | 0 refills | Status: DC

## 2018-10-23 MED ORDER — SERTRALINE HCL 50 MG PO TABS: 75.0000 mg | ORAL_TABLET | Freq: Every day | ORAL | 1 refills | Status: DC

## 2018-11-15 ENCOUNTER — Other Ambulatory Visit: Payer: Self-pay

## 2018-11-15 ENCOUNTER — Ambulatory Visit (INDEPENDENT_AMBULATORY_CARE_PROVIDER_SITE_OTHER): Payer: 59 | Admitting: Family Medicine

## 2018-11-15 ENCOUNTER — Encounter (INDEPENDENT_AMBULATORY_CARE_PROVIDER_SITE_OTHER): Payer: Self-pay | Admitting: Family Medicine

## 2018-11-15 VITALS — BP 105/68 | HR 64 | Temp 97.7°F | Ht 66.0 in | Wt 161.0 lb

## 2018-11-15 DIAGNOSIS — E559 Vitamin D deficiency, unspecified: Secondary | ICD-10-CM | POA: Diagnosis not present

## 2018-11-15 DIAGNOSIS — Z9189 Other specified personal risk factors, not elsewhere classified: Secondary | ICD-10-CM

## 2018-11-15 DIAGNOSIS — E669 Obesity, unspecified: Secondary | ICD-10-CM

## 2018-11-15 DIAGNOSIS — Z6830 Body mass index (BMI) 30.0-30.9, adult: Secondary | ICD-10-CM

## 2018-11-15 DIAGNOSIS — E7849 Other hyperlipidemia: Secondary | ICD-10-CM | POA: Diagnosis not present

## 2018-11-15 DIAGNOSIS — R7303 Prediabetes: Secondary | ICD-10-CM | POA: Diagnosis not present

## 2018-11-15 MED ORDER — VITAMIN D (ERGOCALCIFEROL) 1.25 MG (50000 UNIT) PO CAPS: 50000.0000 [IU] | ORAL_CAPSULE | ORAL | 1 refills | Status: DC

## 2018-11-15 MED ORDER — METFORMIN HCL 500 MG PO TABS: 500.0000 mg | ORAL_TABLET | Freq: Every day | ORAL | 1 refills | Status: DC

## 2018-11-16 LAB — COMPREHENSIVE METABOLIC PANEL
ALT: 10 IU/L (ref 0–32)
AST: 18 IU/L (ref 0–40)
Albumin/Globulin Ratio: 1.9 (ref 1.2–2.2)
Albumin: 3.8 g/dL (ref 3.8–4.8)
Alkaline Phosphatase: 65 IU/L (ref 39–117)
BUN/Creatinine Ratio: 17 (ref 12–28)
BUN: 13 mg/dL (ref 8–27)
Bilirubin Total: 0.3 mg/dL (ref 0.0–1.2)
CO2: 27 mmol/L (ref 20–29)
Calcium: 9 mg/dL (ref 8.7–10.3)
Chloride: 102 mmol/L (ref 96–106)
Creatinine, Ser: 0.77 mg/dL (ref 0.57–1.00)
GFR calc Af Amer: 96 mL/min/{1.73_m2} (ref >59)
GFR calc non Af Amer: 84 mL/min/{1.73_m2} (ref >59)
Globulin, Total: 2 g/dL (ref 1.5–4.5)
Glucose: 103 mg/dL — ABNORMAL HIGH (ref 65–99)
Potassium: 4.1 mmol/L (ref 3.5–5.2)
Sodium: 142 mmol/L (ref 134–144)
Total Protein: 5.8 g/dL — ABNORMAL LOW (ref 6.0–8.5)

## 2018-11-16 LAB — VITAMIN D 25 HYDROXY (VIT D DEFICIENCY, FRACTURES): Vit D, 25-Hydroxy: 48.6 ng/mL (ref 30.0–100.0)

## 2018-11-16 LAB — LIPID PANEL WITH LDL/HDL RATIO
Cholesterol, Total: 211 mg/dL — ABNORMAL HIGH (ref 100–199)
HDL: 36 mg/dL — ABNORMAL LOW (ref >39)
LDL Chol Calc (NIH): 149 mg/dL — ABNORMAL HIGH (ref 0–99)
LDL/HDL Ratio: 4.1 ratio — ABNORMAL HIGH (ref 0.0–3.2)
Triglycerides: 144 mg/dL (ref 0–149)
VLDL Cholesterol Cal: 26 mg/dL (ref 5–40)

## 2018-11-16 LAB — HEMOGLOBIN A1C
Est. average glucose Bld gHb Est-mCnc: 117 mg/dL
Hgb A1c MFr Bld: 5.7 % — ABNORMAL HIGH (ref 4.8–5.6)

## 2018-11-16 LAB — TSH: TSH: 4.95 u[IU]/mL — ABNORMAL HIGH (ref 0.450–4.500)

## 2018-11-16 LAB — INSULIN, RANDOM: INSULIN: 4.2 u[IU]/mL (ref 2.6–24.9)

## 2018-11-16 LAB — T4, FREE: Free T4: 1 ng/dL (ref 0.82–1.77)

## 2018-11-16 LAB — T3: T3, Total: 109 ng/dL (ref 71–180)

## 2018-11-19 ENCOUNTER — Ambulatory Visit (INDEPENDENT_AMBULATORY_CARE_PROVIDER_SITE_OTHER): Payer: 59 | Admitting: Family Medicine

## 2018-11-20 NOTE — Progress Notes (Signed)
Office: [phone]  /  Fax: [phone]   HPI:   Chief Complaint: OBESITY Rebecca Rogers is here to discuss her progress with her obesity treatment plan. She is on the portion control better and make smarter food choices, such as increase vegetables and decrease simple carbohydrates and is following her eating plan approximately 80 % of the time. She states she is exercising 0 minutes 0 times per week. [name] has done well with weight loss. She is concentrating on portion control and smarter choices. She has done well avoiding temptations. Her hunger is controlled. She has questions about how to handle Thanksgiving.  Her weight is 161 lb (73 kg) today and has had a weight loss of 1 pound over a period of 7 weeks since her last visit. She has lost 19 lbs since starting treatment with [name].  Hyperlipidemia Rebecca Rogers has hyperlipidemia and has is attempting to control her cholesterol levels with intensive lifestyle modification including a low saturated fat diet, exercise and weight loss. She denies any chest pain, claudication or myalgias. She is due for labs soon.  Pre-Diabetes Rebecca Rogers has a diagnosis of pre-diabetes based on her elevated Hgb A1c and was informed this puts her at greater risk of developing diabetes. She is working on diet and is tolerating metformin well. She is due for labs. She denies nausea or hypoglycemia.  At risk for diabetes Rebecca Rogers is at higher than average risk for developing diabetes due to her obesity and pre-diabetes. She currently denies polyuria or polydipsia.  Vitamin D Deficiency Rebecca Rogers has a diagnosis of vitamin D deficiency. She is stable on prescription Vit D, but level is not yet at goal. She is due for labs and denies nausea, vomiting or muscle weakness.  ASSESSMENT AND PLAN:  Other hyperlipidemia - Plan: Lipid Panel With LDL/HDL Ratio  Vitamin D deficiency - Plan: Vitamin D (25 hydroxy), Vitamin D, Ergocalciferol, (DRISDOL) 1.25 MG (50000 UT) CAPS  capsule  Prediabetes - Plan: Comprehensive Metabolic Panel (CMET), HgB A1c, Insulin, random, metFORMIN (GLUCOPHAGE) 500 MG tablet, T3, T4, free, TSH  At risk for diabetes mellitus  Class 1 obesity with serious comorbidity and body mass index (BMI) of 30.0 to 30.9 in adult, unspecified obesity type - Starting BMI greater then 30  PLAN:  Hyperlipidemia Rebecca Rogers was informed of the American Heart Association Guidelines emphasizing intensive lifestyle modifications as the first line treatment for hyperlipidemia. We discussed many lifestyle modifications today in depth, and Rebecca Rogers will continue to work on decreasing saturated fats such as fatty red meat, butter and many fried foods. She will also increase vegetables and lean protein in her diet and continue to work on exercise and weight loss efforts. We will check labs today Rebecca Rogers agrees to follow up with our clinic in 8 weeks.  Pre-Diabetes Rebecca Rogers will continue to work on weight loss, exercise, and decreasing simple carbohydrates in her diet to help decrease the risk of diabetes. We dicussed metformin including benefits and risks. She was informed that eating too many simple carbohydrates or too many calories at one sitting increases the likelihood of GI side effects. Rebecca Rogers agrees to continue taking metformin 500 mg q daily #30 and we will refill for 2 months. We will check labs today. Rebecca Rogers agrees to follow up with our clinic in 8 weeks as directed to monitor her progress.  Diabetes risk counseling [name] was given extended (15 minutes) diabetes prevention counseling today. She is 63 y.o. female and has risk factors for diabetes including obesity and pre-diabetes. We discussed  intensive lifestyle modifications today with an emphasis on weight loss as well as increasing exercise and decreasing simple carbohydrates in her diet.  Vitamin D Deficiency Rebecca Rogers was informed that low vitamin D levels contributes to fatigue and are associated with obesity,  breast, and colon cancer. Rebecca Rogers agrees to continue taking prescription Vit D 50,000 IU every week #4 and we will refill for 2 months. She will follow up for routine testing of vitamin D, at least 2-3 times per year. She was informed of the risk of over-replacement of vitamin D and agrees to not increase her dose unless she discusses this with [name] first. We will check labs today. Rebecca Rogers agrees to follow up with our clinic in 8 weeks.  Obesity Rebecca Rogers is currently in the action stage of change. As such, her goal is to continue with weight loss efforts She has agreed to portion control better and make smarter food choices, such as increase vegetables and decrease simple carbohydrates  Rebecca Rogers has been instructed to work up to a goal of 150 minutes of combined cardio and strengthening exercise per week for weight loss and overall health benefits. We discussed the following Behavioral Modification Strategies today: holiday eating strategies    Rebecca Rogers has agreed to follow up with our clinic in 8 weeks. She was informed of the importance of frequent follow up visits to maximize her success with intensive lifestyle modifications for her multiple health conditions.  ALLERGIES: No Known Allergies  MEDICATIONS: Current Outpatient Medications on File Prior to Visit  Medication Sig Dispense Refill   Cholecalciferol (VITAMIN D) 50 MCG (2000 UT) tablet Take 1 tablet (2,000 Units total) by mouth daily. 30 tablet 0   levothyroxine (SYNTHROID) 50 MCG tablet Take 0.5 tablets (25 mcg total) by mouth daily before breakfast. 90 tablet 1   nystatin-triamcinolone (MYCOLOG II) cream Apply 1 application topically 2 (two) times daily. 30 g 1   sertraline (ZOLOFT) 50 MG tablet Take 1.5 tablets (75 mg total) by mouth daily. 1 & 1/2 tab daily 135 tablet 1   triamterene-hydrochlorothiazide (MAXZIDE-25) 37.5-25 MG tablet Take 1 tablet by mouth daily. 90 tablet 1   No current facility-administered medications on file  prior to visit.     PAST MEDICAL HISTORY: Past Medical History:  Diagnosis Date   Adenomyosis    Anxiety    Cyst of right breast    4-5 oclock 1/2 cm   HLD (hyperlipidemia)    Hypertension    bordeline   Hypothyroidism    Increased BMI     PAST SURGICAL HISTORY: Past Surgical History:  Procedure Laterality Date   ABDOMINAL HYSTERECTOMY     lsh- adenomysis and fibroids   BREAST BIOPSY Right 2016   NEG   COLONOSCOPY WITH PROPOFOL N/A 06/05/2015   Procedure: COLONOSCOPY WITH PROPOFOL;  Surgeon: Lucilla Lame, MD;  Location: [place];  Service: Endoscopy;  Laterality: N/A;   COLONOSCOPY WITH PROPOFOL N/A 12/04/2015   Procedure: COLONOSCOPY WITH PROPOFOL;  Surgeon: Lucilla Lame, MD;  Location: [facility];  Service: Endoscopy;  Laterality: N/A;   LAPAROSCOPIC SALPINGOOPHERECTOMY     prophylactic   POLYPECTOMY  06/05/2015   Procedure: POLYPECTOMY;  Surgeon: Lucilla Lame, MD;  Location: [facility];  Service: Endoscopy;;   POLYPECTOMY  12/04/2015   Procedure: POLYPECTOMY;  Surgeon: Lucilla Lame, MD;  Location: [facility];  Service: Endoscopy;;   TONSILLECTOMY  1965    SOCIAL HISTORY: Social History   Tobacco Use   Smoking status: Never Smoker  Smokeless tobacco: Never Used  Substance Use Topics   Alcohol use: No   Drug use: No    FAMILY HISTORY: Family History  Problem Relation Age of Onset   Ovarian cancer Mother    Prostate cancer Father    Breast cancer Neg Hx    Colon cancer Neg Hx    Diabetes Neg Hx    Heart disease Neg Hx     ROS: Review of Systems  Constitutional: Positive for weight loss.  Cardiovascular: Negative for chest pain and claudication.  Gastrointestinal: Negative for nausea and vomiting.  Genitourinary: Negative for frequency.  Musculoskeletal: Negative for myalgias.       Negative muscle weakness  Endo/Heme/Allergies: Negative for polydipsia.       Negative hypoglycemia    PHYSICAL  EXAM: Blood pressure 105/68, pulse 64, temperature 97.7 F (36.5 C), temperature source Oral, height 5\' 6"  (1.676 m), weight 161 lb (73 kg), SpO2 97 %. Body mass index is 25.99 kg/m. Physical Exam Vitals signs reviewed.  Constitutional:      Appearance: Normal appearance. She is obese.  Cardiovascular:     Rate and Rhythm: Normal rate.     Pulses: Normal pulses.  Pulmonary:     Effort: Pulmonary effort is normal.     Breath sounds: Normal breath sounds.  Musculoskeletal: Normal range of motion.  Skin:    General: Skin is warm and dry.  Neurological:     Mental Status: She is alert and oriented to person, place, and time.  Psychiatric:        Mood and Affect: Mood normal.        Behavior: Behavior normal.     RECENT LABS AND TESTS: BMET    Component Value Date/Time   NA 142 11/15/2018 0845   K 4.1 11/15/2018 0845   CL 102 11/15/2018 0845   CO2 27 11/15/2018 0845   GLUCOSE 103 (H) 11/15/2018 0845   BUN 13 11/15/2018 0845   CREATININE 0.77 11/15/2018 0845   CALCIUM 9.0 11/15/2018 0845   GFRNONAA 84 11/15/2018 0845   GFRAA 96 11/15/2018 0845   Lab Results  Component Value Date   HGBA1C 5.7 (H) 11/15/2018   HGBA1C 5.6 07/30/2018   HGBA1C 5.9 (H) 04/05/2018   HGBA1C 5.8 (H) 11/20/2017   HGBA1C 5.9 (H) 08/15/2017   Lab Results  Component Value Date   INSULIN 4.2 11/15/2018   INSULIN 6.1 07/30/2018   INSULIN 7.8 04/05/2018   INSULIN 8.5 11/20/2017   INSULIN 11.7 08/15/2017   CBC    Component Value Date/Time   WBC 4.9 04/05/2018 1149   RBC 4.67 04/05/2018 1149   HGB 13.8 04/05/2018 1149   HCT 40.8 04/05/2018 1149   PLT 365 06/03/2015 1045   MCV 87 04/05/2018 1149   MCH 29.6 04/05/2018 1149   MCHC 33.8 04/05/2018 1149   RDW 13.6 04/05/2018 1149   LYMPHSABS 1.7 04/05/2018 1149   EOSABS 0.4 04/05/2018 1149   BASOSABS 0.0 04/05/2018 1149   Iron/TIBC/Ferritin/ %Sat No results found for: IRON, TIBC, FERRITIN, IRONPCTSAT Lipid Panel     Component Value  Date/Time   CHOL 211 (H) 11/15/2018 0845   TRIG 144 11/15/2018 0845   HDL 36 (L) 11/15/2018 0845   CHOLHDL 6.0 (H) 06/22/2017 0826   LDLCALC 149 (H) 11/15/2018 0845   Hepatic Function Panel     Component Value Date/Time   PROT 5.8 (L) 11/15/2018 0845   ALBUMIN 3.8 11/15/2018 0845   AST 18 11/15/2018 0845   ALT  10 11/15/2018 0845   ALKPHOS 65 11/15/2018 0845   BILITOT 0.3 11/15/2018 0845      Component Value Date/Time   TSH 4.950 (H) 11/15/2018 0845   TSH 3.310 04/05/2018 1149   TSH 5.730 (H) 11/20/2017 0939      OBESITY BEHAVIORAL INTERVENTION VISIT  Today's visit was # 17   Starting weight: 180 lbs Starting date: 08/15/17 Today's weight : 161 lbs Today's date: 11/15/2018 Total lbs lost to date: 84    ASK: We discussed the diagnosis of obesity with Deland Pretty today and [name] agreed to give [name] permission to discuss obesity behavioral modification therapy today.  ASSESS: Aaria has the diagnosis of obesity and her BMI today is 52 Aresha is in the action stage of change   ADVISE: Brenita was educated on the multiple health risks of obesity as well as the benefit of weight loss to improve her health. She was advised of the need for long term treatment and the importance of lifestyle modifications to improve her current health and to decrease her risk of future health problems.  AGREE: Multiple dietary modification options and treatment options were discussed and  Khaleesia agreed to follow the recommendations documented in the above note.  ARRANGE: Joeliz was educated on the importance of frequent visits to treat obesity as outlined per CMS and USPSTF guidelines and agreed to schedule her next follow up appointment today.  I, Trixie Dredge, am acting as transcriptionist for Dennard Nip, MD  I have reviewed the above documentation for accuracy and completeness, and I agree with the above. -Dennard Nip, MD

## 2018-12-03 ENCOUNTER — Encounter (INDEPENDENT_AMBULATORY_CARE_PROVIDER_SITE_OTHER): Payer: Self-pay

## 2018-12-06 ENCOUNTER — Telehealth: Payer: Self-pay

## 2018-12-06 ENCOUNTER — Other Ambulatory Visit: Payer: Self-pay | Admitting: Surgical

## 2018-12-06 ENCOUNTER — Other Ambulatory Visit: Payer: Self-pay

## 2018-12-06 DIAGNOSIS — Z1211 Encounter for screening for malignant neoplasm of colon: Secondary | ICD-10-CM

## 2018-12-06 DIAGNOSIS — Z124 Encounter for screening for malignant neoplasm of cervix: Secondary | ICD-10-CM

## 2018-12-06 NOTE — Telephone Encounter (Signed)
Gastroenterology Pre-Procedure Review  Request Date: Monday 12/31/18 Requesting Physician: Dr. Allen Norris  PATIENT REVIEW QUESTIONS: The patient responded to the following health history questions as indicated:    1. Are you having any GI issues? no 2. Do you have a personal history of Polyps? yes (2017) 3. Do you have a family history of Colon Cancer or Polyps? no 4. Diabetes Mellitus? no 5. Joint replacements in the past 12 months?no 6. Major health problems in the past 3 months?no 7. Any artificial heart valves, MVP, or defibrillator?no    MEDICATIONS & ALLERGIES:    Patient reports the following regarding taking any anticoagulation/antiplatelet therapy:   Plavix, Coumadin, Eliquis, Xarelto, Lovenox, Pradaxa, Brilinta, or Effient? no Aspirin? no  Patient confirms/reports the following medications:  Current Outpatient Medications  Medication Sig Dispense Refill  . Cholecalciferol (VITAMIN D) 50 MCG (2000 UT) tablet Take 1 tablet (2,000 Units total) by mouth daily. 30 tablet 0  . levothyroxine (SYNTHROID) 50 MCG tablet Take 0.5 tablets (25 mcg total) by mouth daily before breakfast. 90 tablet 1  . metFORMIN (GLUCOPHAGE) 500 MG tablet Take 1 tablet (500 mg total) by mouth daily with breakfast. 30 tablet 1  . nystatin-triamcinolone (MYCOLOG II) cream Apply 1 application topically 2 (two) times daily. 30 g 1  . sertraline (ZOLOFT) 50 MG tablet Take 1.5 tablets (75 mg total) by mouth daily. 1 & 1/2 tab daily 135 tablet 1  . triamterene-hydrochlorothiazide (MAXZIDE-25) 37.5-25 MG tablet Take 1 tablet by mouth daily. 90 tablet 1  . Vitamin D, Ergocalciferol, (DRISDOL) 1.25 MG (50000 UT) CAPS capsule Take 1 capsule (50,000 Units total) by mouth every 7 (seven) days. 4 capsule 1   No current facility-administered medications for this visit.     Patient confirms/reports the following allergies:  No Known Allergies  No orders of the defined types were placed in this  encounter.   AUTHORIZATION INFORMATION Primary Insurance: 1D#: Group #:  Secondary Insurance: 1D#: Group #:  SCHEDULE INFORMATION: Date: 12/31/18 Time: Location:[facility]

## 2018-12-07 ENCOUNTER — Telehealth: Payer: Self-pay

## 2018-12-07 ENCOUNTER — Other Ambulatory Visit: Payer: Self-pay

## 2018-12-07 NOTE — Telephone Encounter (Signed)
Pt LVM to r/s colonoscopy from [facility] 12/31/18 to [facility] 12/21/18 with Dr. Candis Shine in Endo has been notified of change. New instructions sent via mychart and will be mailed. Referral updated.  Thanks,  Sharyn Lull

## 2018-12-13 ENCOUNTER — Other Ambulatory Visit: Payer: Self-pay

## 2018-12-13 MED ORDER — NA SULFATE-K SULFATE-MG SULF 17.5-3.13-1.6 GM/177ML PO SOLN: 1.0000 | Freq: Once | ORAL | 0 refills | Status: AC

## 2018-12-17 ENCOUNTER — Other Ambulatory Visit: Payer: Self-pay | Admitting: Surgical

## 2018-12-17 MED ORDER — TRIAMTERENE-HCTZ 37.5-25 MG PO TABS: 1.0000 | ORAL_TABLET | Freq: Every day | ORAL | 2 refills | Status: DC

## 2018-12-18 ENCOUNTER — Other Ambulatory Visit
Admission: RE | Admit: 2018-12-18 | Discharge: 2018-12-18 | Disposition: A | Discharge status: Home / Self Care | Payer: 59 | Source: Ambulatory Visit | Attending: Gastroenterology | Admitting: Gastroenterology

## 2018-12-18 ENCOUNTER — Other Ambulatory Visit: Payer: Self-pay

## 2018-12-18 DIAGNOSIS — Z01812 Encounter for preprocedural laboratory examination: Secondary | ICD-10-CM | POA: Diagnosis not present

## 2018-12-18 DIAGNOSIS — Z20828 Contact with and (suspected) exposure to other viral communicable diseases: Secondary | ICD-10-CM | POA: Insufficient documentation

## 2018-12-19 LAB — SARS CORONAVIRUS 2 (TAT 6-24 HRS): SARS Coronavirus 2: NEGATIVE

## 2018-12-21 ENCOUNTER — Encounter: Payer: Self-pay | Admitting: Registered Nurse

## 2018-12-21 ENCOUNTER — Ambulatory Visit
Admission: RE | Admit: 2018-12-21 | Discharge: 2018-12-21 | Disposition: A | Discharge status: Home / Self Care | Payer: 59 | Attending: Gastroenterology | Admitting: Gastroenterology

## 2018-12-21 ENCOUNTER — Encounter
Admission: RE | Disposition: A | Discharge status: Home / Self Care | Payer: Self-pay | Source: Home / Self Care | Attending: Gastroenterology

## 2018-12-21 ENCOUNTER — Encounter: Payer: Self-pay | Admitting: Gastroenterology

## 2018-12-21 DIAGNOSIS — Z7984 Long term (current) use of oral hypoglycemic drugs: Secondary | ICD-10-CM | POA: Diagnosis not present

## 2018-12-21 DIAGNOSIS — K640 First degree hemorrhoids: Secondary | ICD-10-CM | POA: Diagnosis not present

## 2018-12-21 DIAGNOSIS — Z79899 Other long term (current) drug therapy: Secondary | ICD-10-CM | POA: Insufficient documentation

## 2018-12-21 DIAGNOSIS — K5730 Diverticulosis of large intestine without perforation or abscess without bleeding: Secondary | ICD-10-CM | POA: Insufficient documentation

## 2018-12-21 DIAGNOSIS — Z79890 Hormone replacement therapy (postmenopausal): Secondary | ICD-10-CM | POA: Insufficient documentation

## 2018-12-21 DIAGNOSIS — Z09 Encounter for follow-up examination after completed treatment for conditions other than malignant neoplasm: Secondary | ICD-10-CM | POA: Diagnosis present

## 2018-12-21 DIAGNOSIS — K5790 Diverticulosis of intestine, part unspecified, without perforation or abscess without bleeding: Secondary | ICD-10-CM | POA: Diagnosis not present

## 2018-12-21 DIAGNOSIS — D125 Benign neoplasm of sigmoid colon: Secondary | ICD-10-CM | POA: Diagnosis not present

## 2018-12-21 DIAGNOSIS — E039 Hypothyroidism, unspecified: Secondary | ICD-10-CM | POA: Insufficient documentation

## 2018-12-21 DIAGNOSIS — K635 Polyp of colon: Secondary | ICD-10-CM | POA: Insufficient documentation

## 2018-12-21 DIAGNOSIS — Z1211 Encounter for screening for malignant neoplasm of colon: Secondary | ICD-10-CM | POA: Diagnosis not present

## 2018-12-21 DIAGNOSIS — Z86010 Personal history of colonic polyps: Secondary | ICD-10-CM | POA: Diagnosis not present

## 2018-12-21 DIAGNOSIS — F419 Anxiety disorder, unspecified: Secondary | ICD-10-CM | POA: Diagnosis not present

## 2018-12-21 HISTORY — PX: COLONOSCOPY WITH PROPOFOL: SHX5780

## 2018-12-21 SURGERY — COLONOSCOPY WITH PROPOFOL
Anesthesia: General

## 2018-12-21 MED ORDER — PROPOFOL 10 MG/ML IV BOLUS
INTRAVENOUS | Status: DC | PRN
  Administered 2018-12-21: 100 mg via INTRAVENOUS

## 2018-12-21 MED ORDER — SODIUM CHLORIDE 0.9 % IV SOLN: INTRAVENOUS | Status: DC

## 2018-12-21 MED ORDER — PROPOFOL 500 MG/50ML IV EMUL
INTRAVENOUS | Status: DC | PRN
  Administered 2018-12-21: 140 ug/kg/min via INTRAVENOUS

## 2018-12-21 NOTE — Transfer of Care (Signed)
Immediate Anesthesia Transfer of Care Note  Patient: Rebecca Rogers  Procedure(s) Performed: COLONOSCOPY WITH PROPOFOL (N/A )  Patient Location: PACU  Anesthesia Type:General  Level of Consciousness: sedated  Airway & Oxygen Therapy: Patient Spontanous Breathing and Patient connected to nasal cannula oxygen  Post-op Assessment: Report given to RN  Post vital signs: Reviewed and stable  Last Vitals:  Vitals Value Taken Time  BP 119/50 12/21/18 1109  Temp 36.4 C 12/21/18 1109  Pulse 75 12/21/18 1109  Resp 15 12/21/18 1109  SpO2 2 % 12/21/18 1109    Last Pain:  Vitals:   12/21/18 1109  TempSrc:   PainSc: 0-No pain         Complications: No apparent anesthesia complications

## 2018-12-21 NOTE — H&P (Signed)
Rebecca Lame, MD [address] [place], [place] [address] Phone:[phone] Fax : [phone]  Primary Care Physician:  Patient, No Pcp Per Primary Gastroenterologist:  Dr. Allen Norris  Pre-Procedure History & Physical: HPI:  Rebecca Rogers is a 63 y.o. female is here for an colonoscopy.   Past Medical History:  Diagnosis Date  . Adenomyosis   . Anxiety   . Cyst of right breast    4-5 oclock 1/2 cm  . HLD (hyperlipidemia)   . Hypertension    bordeline  . Hypothyroidism   . Increased BMI     Past Surgical History:  Procedure Laterality Date  . ABDOMINAL HYSTERECTOMY     lsh- adenomysis and fibroids  . BREAST BIOPSY Right 2016   NEG  . COLONOSCOPY WITH PROPOFOL N/A 06/05/2015   Procedure: COLONOSCOPY WITH PROPOFOL;  Surgeon: Rebecca Lame, MD;  Location: [facility];  Service: Endoscopy;  Laterality: N/A;  . COLONOSCOPY WITH PROPOFOL N/A 12/04/2015   Procedure: COLONOSCOPY WITH PROPOFOL;  Surgeon: Rebecca Lame, MD;  Location: [facility];  Service: Endoscopy;  Laterality: N/A;  . LAPAROSCOPIC SALPINGOOPHERECTOMY     prophylactic  . POLYPECTOMY  06/05/2015   Procedure: POLYPECTOMY;  Surgeon: Rebecca Lame, MD;  Location: [facility];  Service: Endoscopy;;  . POLYPECTOMY  12/04/2015   Procedure: POLYPECTOMY;  Surgeon: Rebecca Lame, MD;  Location: [place];  Service: Endoscopy;;  . TONSILLECTOMY  1965    Prior to Admission medications   Medication Sig Start Date End Date Taking? Authorizing Provider  levothyroxine (SYNTHROID) 50 MCG tablet Take 0.5 tablets (25 mcg total) by mouth daily before breakfast. 05/10/18  Yes Harlin Heys, MD  metFORMIN (GLUCOPHAGE) 500 MG tablet Take 1 tablet (500 mg total) by mouth daily with breakfast. 11/15/18  Yes Leafy Ro, Caren D, MD  sertraline (ZOLOFT) 50 MG tablet Take 1.5 tablets (75 mg total) by mouth daily. 1 & 1/2 tab daily 10/23/18  Yes Harlin Heys, MD  triamterene-hydrochlorothiazide  (MAXZIDE-25) 37.5-25 MG tablet Take 1 tablet by mouth daily. 12/17/18  Yes Harlin Heys, MD  Vitamin D, Ergocalciferol, (DRISDOL) 1.25 MG (50000 UT) CAPS capsule Take 1 capsule (50,000 Units total) by mouth every 7 (seven) days. 11/15/18  Yes Beasley, Caren D, MD  Cholecalciferol (VITAMIN D) 50 MCG (2000 UT) tablet Take 1 tablet (2,000 Units total) by mouth daily. Patient not taking: Reported on 12/21/2018 05/30/18   Dennard Nip D, MD  nystatin-triamcinolone [facility] II) cream Apply 1 application topically 2 (two) times daily. 08/10/17   Defrancesco, Alanda Slim, MD    Allergies as of 12/06/2018  . (No Known Allergies)    Family History  Problem Relation Age of Onset  . Ovarian cancer Mother   . Prostate cancer Father   . Breast cancer Neg Hx   . Colon cancer Neg Hx   . Diabetes Neg Hx   . Heart disease Neg Hx     Social History   Socioeconomic History  . Marital status: Married    Spouse name: Breshawn Citrano  . Number of children: Not on file  . Years of education: Not on file  . Highest education level: Not on file  Occupational History  . Not on file  Tobacco Use  . Smoking status: Never Smoker  . Smokeless tobacco: Never Used  Substance and Sexual Activity  . Alcohol use: No  . Drug use: No  . Sexual activity: Not Currently  Other Topics Concern  . Not  on file  Social History Narrative  . Not on file   Social Determinants of Health   Financial Resource Strain:   . Difficulty of Paying Living Expenses: Not on file  Food Insecurity:   . Worried About Charity fundraiser in the Last Year: Not on file  . Ran Out of Food in the Last Year: Not on file  Transportation Needs:   . Lack of Transportation (Medical): Not on file  . Lack of Transportation (Non-Medical): Not on file  Physical Activity:   . Days of Exercise per Week: Not on file  . Minutes of Exercise per Session: Not on file  Stress:   . Feeling of Stress : Not on file  Social Connections:   .  Frequency of Communication with Friends and Family: Not on file  . Frequency of Social Gatherings with Friends and Family: Not on file  . Attends Religious Services: Not on file  . Active Member of Clubs or Organizations: Not on file  . Attends Archivist Meetings: Not on file  . Marital Status: Not on file  Intimate Partner Violence:   . Fear of Current or Ex-Partner: Not on file  . Emotionally Abused: Not on file  . Physically Abused: Not on file  . Sexually Abused: Not on file    Review of Systems: See HPI, otherwise negative ROS  Physical Exam: BP 133/86   Pulse 68   Temp 97.7 F (36.5 C) (Temporal)   Resp 16   Ht 5\' 6"  (1.676 m)   Wt 72.1 kg   SpO2 100%   BMI 25.66 kg/m  General:   Alert,  pleasant and cooperative in NAD Head:  Normocephalic and atraumatic. Neck:  Supple; no masses or thyromegaly. Lungs:  Clear throughout to auscultation.    Heart:  Regular rate and rhythm. Abdomen:  Soft, nontender and nondistended. Normal bowel sounds, without guarding, and without rebound.   Neurologic:  Alert and  oriented x4;  grossly normal neurologically.  Impression/Plan: SHAIDA Rogers is here for an colonoscopy to be performed for history of colon polyps 12/04/2014 that were adenomatous.  Risks, benefits, limitations, and alternatives regarding  colonoscopy have been reviewed with the patient.  Questions have been answered.  All parties agreeable.   Rebecca Lame, MD  12/21/2018, 10:37 AM

## 2018-12-21 NOTE — Op Note (Signed)
[facility] Patient Name: Rebecca Rogers Procedure Date: 12/21/2018 10:43 AM MRN: [id number] Account #: [id number] Date of Birth: Dec 28, 1955 Admit Type: Outpatient Age: 63 Room: [facility] ENDO ROOM 4 Gender: Female Note Status: Finalized Procedure:             Colonoscopy Indications:           High risk colon cancer surveillance: Personal history                         of colonic polyps Providers:             Lucilla Lame MD, MD Referring MD:          No Local Md, MD (Referring MD) Medicines:             Propofol per Anesthesia Complications:         No immediate complications. Procedure:             Pre-Anesthesia Assessment:                        - Prior to the procedure, a History and Physical was                         performed, and patient medications and allergies were                         reviewed. The patient's tolerance of previous                         anesthesia was also reviewed. The risks and benefits                         of the procedure and the sedation options and risks                         were discussed with the patient. All questions were                         answered, and informed consent was obtained. Prior                         Anticoagulants: The patient has taken no previous                         anticoagulant or antiplatelet agents. ASA Grade                         Assessment: II - A patient with mild systemic disease.                         After reviewing the risks and benefits, the patient                         was deemed in satisfactory condition to undergo the                         procedure.  After obtaining informed consent, the colonoscope was                         passed under direct vision. Throughout the procedure,                         the patient's blood pressure, pulse, and oxygen                         saturations were monitored continuously. The             Colonoscope was introduced through the anus and                         advanced to the the cecum, identified by appendiceal                         orifice and ileocecal valve. The colonoscopy was                         performed without difficulty. The patient tolerated                         the procedure well. The quality of the bowel                         preparation was excellent. Findings:      The perianal and digital rectal examinations were normal.      Three sessile polyps were found in the sigmoid colon. The polyps were 1       to 2 mm in size. These polyps were removed with a cold biopsy forceps.       Resection and retrieval were complete.      Multiple small-mouthed diverticula were found in the sigmoid colon.      Non-bleeding internal hemorrhoids were found during retroflexion. The       hemorrhoids were Grade I (internal hemorrhoids that do not prolapse). Impression:            - Three 1 to 2 mm polyps in the sigmoid colon, removed                         with a cold biopsy forceps. Resected and retrieved.                        - Diverticulosis in the sigmoid colon.                        - Non-bleeding internal hemorrhoids. Recommendation:        - Discharge patient to home.                        - Resume previous diet.                        - Continue present medications.                        - Await pathology results.                        -  Repeat colonoscopy in 5 years for surveillance. Procedure Code(s):     --- Professional ---                        [phone], Colonoscopy, flexible; with biopsy, single or                         multiple Diagnosis Code(s):     --- Professional ---                        Z86.010, Personal history of colonic polyps                        K63.5, Polyp of colon CPT copyright 2019 American Medical Association. All rights reserved. The codes documented in this report are preliminary and upon coder review may  be  revised to meet current compliance requirements. Lucilla Lame MD, MD 12/21/2018 11:04:51 AM This report has been signed electronically. Number of Addenda: 0 Note Initiated On: 12/21/2018 10:43 AM Scope Withdrawal Time: 0 hours 9 minutes 59 seconds  Total Procedure Duration: 0 hours 15 minutes 49 seconds  Estimated Blood Loss:  Estimated blood loss: none.      [facility]

## 2018-12-21 NOTE — Anesthesia Post-op Follow-up Note (Signed)
Anesthesia QCDR form completed.        

## 2018-12-21 NOTE — Anesthesia Preprocedure Evaluation (Signed)
Anesthesia Evaluation  Patient identified by MRN, date of birth, ID band Patient awake    Reviewed: Allergy & Precautions, H&P , NPO status , Patient's Chart, lab work & pertinent test results, reviewed documented beta blocker date and time   History of Anesthesia Complications Negative for: history of anesthetic complications  Airway Mallampati: III  TM Distance: >3 FB Neck ROM: full    Dental  (+) Dental Advidsory Given, Teeth Intact   Pulmonary neg pulmonary ROS,    Pulmonary exam normal        Cardiovascular Exercise Tolerance: Good hypertension, (-) angina(-) Past MI and (-) Cardiac Stents Normal cardiovascular exam(-) dysrhythmias (-) Valvular Problems/Murmurs     Neuro/Psych PSYCHIATRIC DISORDERS Anxiety negative neurological ROS     GI/Hepatic negative GI ROS, Neg liver ROS,   Endo/Other  neg diabetesHypothyroidism   Renal/GU negative Renal ROS  negative genitourinary   Musculoskeletal   Abdominal   Peds  Hematology negative hematology ROS (+)   Anesthesia Other Findings Past Medical History: No date: Adenomyosis No date: Anxiety No date: Cyst of right breast     Comment:  4-5 oclock 1/2 cm No date: HLD (hyperlipidemia) No date: Hypertension     Comment:  bordeline No date: Hypothyroidism No date: Increased BMI   Reproductive/Obstetrics negative OB ROS                             Anesthesia Physical Anesthesia Plan  ASA: II  Anesthesia Plan: General   Post-op Pain Management:    Induction: Intravenous  PONV Risk Score and Plan: 3 and Propofol infusion and TIVA  Airway Management Planned: Natural Airway and Nasal Cannula  Additional Equipment:   Intra-op Plan:   Post-operative Plan:   Informed Consent: I have reviewed the patients History and Physical, chart, labs and discussed the procedure including the risks, benefits and alternatives for the proposed  anesthesia with the patient or authorized representative who has indicated his/her understanding and acceptance.     Dental Advisory Given  Plan Discussed with: Anesthesiologist, CRNA and Surgeon  Anesthesia Plan Comments:         Anesthesia Quick Evaluation

## 2018-12-21 NOTE — Anesthesia Postprocedure Evaluation (Signed)
Anesthesia Post Note  Patient: Rebecca Rogers  Procedure(s) Performed: COLONOSCOPY WITH PROPOFOL (N/A )  Patient location during evaluation: Endoscopy Anesthesia Type: General Level of consciousness: awake and alert Pain management: pain level controlled Vital Signs Assessment: post-procedure vital signs reviewed and stable Respiratory status: spontaneous breathing, nonlabored ventilation, respiratory function stable and patient connected to nasal cannula oxygen Cardiovascular status: blood pressure returned to baseline and stable Postop Assessment: no apparent nausea or vomiting Anesthetic complications: no     Last Vitals:  Vitals:   12/21/18 1118 12/21/18 1128  BP: 123/62 123/62  Pulse: 68 65  Resp: 14 14  Temp:    SpO2: 98% 97%    Last Pain:  Vitals:   12/21/18 1128  TempSrc:   PainSc: 0-No pain                 Martha Clan

## 2018-12-24 ENCOUNTER — Encounter: Payer: Self-pay | Admitting: *Deleted

## 2018-12-24 ENCOUNTER — Encounter: Payer: Self-pay | Admitting: Gastroenterology

## 2018-12-24 LAB — SURGICAL PATHOLOGY

## 2019-01-14 ENCOUNTER — Ambulatory Visit: Payer: 59 | Attending: Internal Medicine

## 2019-01-14 ENCOUNTER — Ambulatory Visit (INDEPENDENT_AMBULATORY_CARE_PROVIDER_SITE_OTHER): Payer: 59 | Admitting: Family Medicine

## 2019-01-14 DIAGNOSIS — Z20822 Contact with and (suspected) exposure to covid-19: Secondary | ICD-10-CM

## 2019-01-15 LAB — NOVEL CORONAVIRUS, NAA: SARS-CoV-2, NAA: NOT DETECTED

## 2019-01-23 ENCOUNTER — Ambulatory Visit (INDEPENDENT_AMBULATORY_CARE_PROVIDER_SITE_OTHER): Payer: 59 | Admitting: Family Medicine

## 2019-01-23 ENCOUNTER — Other Ambulatory Visit: Payer: Self-pay

## 2019-01-23 ENCOUNTER — Encounter (INDEPENDENT_AMBULATORY_CARE_PROVIDER_SITE_OTHER): Payer: Self-pay | Admitting: Family Medicine

## 2019-01-23 VITALS — BP 110/69 | HR 74 | Temp 97.9°F | Ht 66.0 in | Wt 163.0 lb

## 2019-01-23 DIAGNOSIS — R7303 Prediabetes: Secondary | ICD-10-CM

## 2019-01-23 DIAGNOSIS — Z6830 Body mass index (BMI) 30.0-30.9, adult: Secondary | ICD-10-CM

## 2019-01-23 DIAGNOSIS — E559 Vitamin D deficiency, unspecified: Secondary | ICD-10-CM | POA: Diagnosis not present

## 2019-01-23 DIAGNOSIS — E669 Obesity, unspecified: Secondary | ICD-10-CM | POA: Diagnosis not present

## 2019-01-23 DIAGNOSIS — Z9189 Other specified personal risk factors, not elsewhere classified: Secondary | ICD-10-CM

## 2019-01-23 MED ORDER — METFORMIN HCL 500 MG PO TABS: 500.0000 mg | ORAL_TABLET | Freq: Every day | ORAL | 0 refills | Status: DC

## 2019-01-23 MED ORDER — VITAMIN D (ERGOCALCIFEROL) 1.25 MG (50000 UNIT) PO CAPS: 50000.0000 [IU] | ORAL_CAPSULE | ORAL | 0 refills | Status: DC

## 2019-01-24 NOTE — Progress Notes (Signed)
Chief Complaint:   OBESITY Rebecca Rogers is here to discuss her progress with her obesity treatment plan along with follow-up of her obesity related diagnoses. Rebecca Rogers is on practicing portion control and making smarter food choices, such as increasing vegetables and decreasing simple carbohydrates and states she is following her eating plan approximately 80% of the time. Rebecca Rogers states she is doing 0 minutes 0 times per week.  Today's visit was #: 18 Starting weight: 180 lbs Starting date: 08/15/17 Today's weight: 163 lbs Today's date: 01/23/2019 Total lbs lost to date: 17 Total lbs lost since last in-office visit: 0  Interim History: Rebecca Rogers did some celebration eating over the holidays. Her parents and other family members are positive for Rebecca Rogers, and she is especially worried about her parents health (she and her husband tested negative). She is continuing to work on portion control and Rebecca Rogers, and she feels she will be able to do better as her life gets back to normal.  Subjective:   1. Vitamin D deficiency Rebecca Rogers is stable on Vit D, and she denies nausea, vomiting, or muscle weakness. She requests a refill today.  2. Pre-diabetes Rebecca Rogers is stable on metformin, and she denies nausea or vomiting. She requests a refill today.  3. At increased risk of exposure to severe acute respiratory syndrome coronavirus 2 (SARS-CoV-2) Rebecca Rogers is at higher risk of COVID19 infection due to multiple family members with Rebecca Rogers, and a high rate of COVID19 locally.  Assessment/Plan:   1. Vitamin D deficiency Low Vitamin D level contributes to fatigue and are associated with obesity, breast, and colon cancer. We will refill prescription Vit D with a 90 day supply. Rebecca Rogers will follow-up for routine testing of Vitamin D, at least 2-3 times per year to avoid over-replacement. We will continue to monitor.  - Vitamin D, Ergocalciferol, (DRISDOL) 1.25 MG (50000 UNIT) CAPS capsule; Take 1 capsule  (50,000 Units total) by mouth every 7 (seven) days.  Dispense: 12 capsule; Refill: 0  2. Pre-diabetes Rebecca Rogers will continue to work on weight loss, exercise, and decreasing simple carbohydrates to help decrease the risk of diabetes. We will refill metformin with a 90 day supply. We will continue to monitor.  - metFORMIN (GLUCOPHAGE) 500 MG tablet; Take 1 tablet (500 mg total) by mouth daily with breakfast.  Dispense: 90 tablet; Refill: 0  3. At increased risk of exposure to severe acute respiratory syndrome coronavirus 2 (SARS-CoV-2) Rebecca Rogers was given approximately 30 minutes of COVID19 infection risk education and counseling today. We will continue to monitor. Orders and follow up as documented in patient record.  Counseling  COVID-19 is a respiratory infection that is caused by a virus. It can cause serious infections, such as pneumonia, acute respiratory distress syndrome, acute respiratory failure, or sepsis.  You are more likely to develop a serious illness if you are 20 years of age or older, have a weak immune system, live in a nursing home, have chronic disease, or have obesity.  Get vaccinated as soon as they are available to you.  For our most current information, please visit [email or web address].  Wash your hands often with soap and water for 20 seconds. If soap and water are not available, use alcohol-based hand sanitizer.  Wear a face mask. Make sure your mask covers your nose and mouth.  Maintain at least 6 feet distance from others when in public.  Get help right away if  You have trouble breathing, chest pain, confusion, or other concerning symptoms.  Repetitive spaced learning was employed today to elicit superior memory formation and behavioral change.  4. Class 1 obesity with serious comorbidity and body mass index (BMI) of 30.0 to 30.9 in adult, unspecified obesity type Rebecca Rogers is currently in the action stage of change. As such, her goal is to continue with  weight loss efforts. She has agreed to practicing portion control and making smarter food choices, such as increasing vegetables and decreasing simple carbohydrates.   Behavioral modification strategies: increasing lean protein intake and emotional eating strategies.  Rebecca Rogers has agreed to follow-up with our clinic in 2 to 3 months. She was informed of the importance of frequent follow-up visits to maximize her success with intensive lifestyle modifications for her multiple health conditions.   Objective:   Blood pressure 110/69, pulse 74, temperature 97.9 F (36.6 C), temperature source Oral, height 5\' 6"  (1.676 m), weight 163 lb (73.9 kg), SpO2 99 %. Body mass index is 26.31 kg/m.  General: Cooperative, alert, well developed, in no acute distress. HEENT: Conjunctivae and lids unremarkable. Cardiovascular: Regular rhythm.  Lungs: Normal work of breathing. Neurologic: No focal deficits.   Lab Results  Component Value Date   CREATININE 0.77 11/15/2018   BUN 13 11/15/2018   NA 142 11/15/2018   K 4.1 11/15/2018   CL 102 11/15/2018   CO2 27 11/15/2018   Lab Results  Component Value Date   ALT 10 11/15/2018   AST 18 11/15/2018   ALKPHOS 65 11/15/2018   BILITOT 0.3 11/15/2018   Lab Results  Component Value Date   HGBA1C 5.7 (H) 11/15/2018   HGBA1C 5.6 07/30/2018   HGBA1C 5.9 (H) 04/05/2018   HGBA1C 5.8 (H) 11/20/2017   HGBA1C 5.9 (H) 08/15/2017   Lab Results  Component Value Date   INSULIN 4.2 11/15/2018   INSULIN 6.1 07/30/2018   INSULIN 7.8 04/05/2018   INSULIN 8.5 11/20/2017   INSULIN 11.7 08/15/2017   Lab Results  Component Value Date   TSH 4.950 (H) 11/15/2018   Lab Results  Component Value Date   CHOL 211 (H) 11/15/2018   HDL 36 (L) 11/15/2018   LDLCALC 149 (H) 11/15/2018   TRIG 144 11/15/2018   CHOLHDL 6.0 (H) 06/22/2017   Lab Results  Component Value Date   WBC 4.9 04/05/2018   HGB 13.8 04/05/2018   HCT 40.8 04/05/2018   MCV 87 04/05/2018   PLT  365 06/03/2015   No results found for: IRON, TIBC, FERRITIN  Attestation Statements:   Reviewed by clinician on day of visit: allergies, medications, problem list, medical history, surgical history, family history, social history, and previous encounter notes.   I, Trixie Dredge, am acting as transcriptionist for Dennard Nip, MD.  I have reviewed the above documentation for accuracy and completeness, and I agree with the above. -  Dennard Nip, MD

## 2019-04-29 ENCOUNTER — Ambulatory Visit (INDEPENDENT_AMBULATORY_CARE_PROVIDER_SITE_OTHER): Payer: 59 | Admitting: Family Medicine

## 2019-04-29 ENCOUNTER — Other Ambulatory Visit: Payer: Self-pay

## 2019-04-29 ENCOUNTER — Encounter (INDEPENDENT_AMBULATORY_CARE_PROVIDER_SITE_OTHER): Payer: Self-pay | Admitting: Family Medicine

## 2019-04-29 VITALS — BP 122/76 | HR 70 | Temp 98.0°F | Ht 66.0 in | Wt 162.0 lb

## 2019-04-29 DIAGNOSIS — E66811 Obesity, class 1: Secondary | ICD-10-CM

## 2019-04-29 DIAGNOSIS — E559 Vitamin D deficiency, unspecified: Secondary | ICD-10-CM

## 2019-04-29 DIAGNOSIS — E038 Other specified hypothyroidism: Secondary | ICD-10-CM

## 2019-04-29 DIAGNOSIS — Z6830 Body mass index (BMI) 30.0-30.9, adult: Secondary | ICD-10-CM | POA: Diagnosis not present

## 2019-04-29 DIAGNOSIS — E8881 Metabolic syndrome: Secondary | ICD-10-CM

## 2019-04-29 DIAGNOSIS — E669 Obesity, unspecified: Secondary | ICD-10-CM

## 2019-04-29 DIAGNOSIS — Z9189 Other specified personal risk factors, not elsewhere classified: Secondary | ICD-10-CM | POA: Diagnosis not present

## 2019-04-29 DIAGNOSIS — E88819 Insulin resistance, unspecified: Secondary | ICD-10-CM

## 2019-04-30 LAB — COMPREHENSIVE METABOLIC PANEL
ALT: 12 IU/L (ref 0–32)
AST: 17 IU/L (ref 0–40)
Albumin/Globulin Ratio: 1.6 (ref 1.2–2.2)
Albumin: 4.2 g/dL (ref 3.8–4.8)
Alkaline Phosphatase: 95 IU/L (ref 39–117)
BUN/Creatinine Ratio: 14 (ref 12–28)
BUN: 11 mg/dL (ref 8–27)
Bilirubin Total: 0.3 mg/dL (ref 0.0–1.2)
CO2: 26 mmol/L (ref 20–29)
Calcium: 9.5 mg/dL (ref 8.7–10.3)
Chloride: 103 mmol/L (ref 96–106)
Creatinine, Ser: 0.78 mg/dL (ref 0.57–1.00)
GFR calc Af Amer: 95 mL/min/{1.73_m2} (ref >59)
GFR calc non Af Amer: 82 mL/min/{1.73_m2} (ref >59)
Globulin, Total: 2.6 g/dL (ref 1.5–4.5)
Glucose: 114 mg/dL — ABNORMAL HIGH (ref 65–99)
Potassium: 4.1 mmol/L (ref 3.5–5.2)
Sodium: 142 mmol/L (ref 134–144)
Total Protein: 6.8 g/dL (ref 6.0–8.5)

## 2019-04-30 LAB — FOLATE: Folate: 4.3 ng/mL (ref >3.0)

## 2019-04-30 LAB — CBC WITH DIFFERENTIAL/PLATELET
Basophils Absolute: 0 10*3/uL (ref 0.0–0.2)
Basos: 1 %
EOS (ABSOLUTE): 0.5 10*3/uL — ABNORMAL HIGH (ref 0.0–0.4)
Eos: 9 %
Hematocrit: 41.7 % (ref 34.0–46.6)
Hemoglobin: 13.8 g/dL (ref 11.1–15.9)
Immature Grans (Abs): 0 10*3/uL (ref 0.0–0.1)
Immature Granulocytes: 0 %
Lymphocytes Absolute: 2.2 10*3/uL (ref 0.7–3.1)
Lymphs: 38 %
MCH: 30.1 pg (ref 26.6–33.0)
MCHC: 33.1 g/dL (ref 31.5–35.7)
MCV: 91 fL (ref 79–97)
Monocytes Absolute: 0.4 10*3/uL (ref 0.1–0.9)
Monocytes: 7 %
Neutrophils Absolute: 2.6 10*3/uL (ref 1.4–7.0)
Neutrophils: 45 %
Platelets: 375 10*3/uL (ref 150–450)
RBC: 4.58 x10E6/uL (ref 3.77–5.28)
RDW: 13.5 % (ref 11.7–15.4)
WBC: 5.9 10*3/uL (ref 3.4–10.8)

## 2019-04-30 LAB — VITAMIN B12: Vitamin B-12: 408 pg/mL (ref 232–1245)

## 2019-04-30 LAB — INSULIN, RANDOM: INSULIN: 10.7 u[IU]/mL (ref 2.6–24.9)

## 2019-04-30 LAB — T4, FREE: Free T4: 1.08 ng/dL (ref 0.82–1.77)

## 2019-04-30 LAB — HEMOGLOBIN A1C
Est. average glucose Bld gHb Est-mCnc: 123 mg/dL
Hgb A1c MFr Bld: 5.9 % — ABNORMAL HIGH (ref 4.8–5.6)

## 2019-04-30 LAB — LIPID PANEL WITH LDL/HDL RATIO
Cholesterol, Total: 230 mg/dL — ABNORMAL HIGH (ref 100–199)
HDL: 38 mg/dL — ABNORMAL LOW (ref >39)
LDL Chol Calc (NIH): 163 mg/dL — ABNORMAL HIGH (ref 0–99)
LDL/HDL Ratio: 4.3 ratio — ABNORMAL HIGH (ref 0.0–3.2)
Triglycerides: 160 mg/dL — ABNORMAL HIGH (ref 0–149)
VLDL Cholesterol Cal: 29 mg/dL (ref 5–40)

## 2019-04-30 LAB — T3: T3, Total: 107 ng/dL (ref 71–180)

## 2019-04-30 LAB — VITAMIN D 25 HYDROXY (VIT D DEFICIENCY, FRACTURES): Vit D, 25-Hydroxy: 69.2 ng/mL (ref 30.0–100.0)

## 2019-04-30 LAB — TSH: TSH: 6.31 u[IU]/mL — ABNORMAL HIGH (ref 0.450–4.500)

## 2019-05-01 MED ORDER — VITAMIN D (ERGOCALCIFEROL) 1.25 MG (50000 UNIT) PO CAPS: 50000.0000 [IU] | ORAL_CAPSULE | ORAL | 0 refills | Status: DC

## 2019-05-01 MED ORDER — METFORMIN HCL 500 MG PO TABS: 500.0000 mg | ORAL_TABLET | Freq: Every day | ORAL | 0 refills | Status: DC

## 2019-05-01 NOTE — Progress Notes (Signed)
Chief Complaint:   OBESITY Rebecca Rogers is here to discuss her progress with her obesity treatment plan along with follow-up of her obesity related diagnoses. Rebecca Rogers is on the Category 2 Plan and states she is following her eating plan approximately 75% of the time. Rebecca Rogers states she is doing 0 minutes 0 times per week.  Today's visit was #: 70 Starting weight: 180 lbs Starting date: 08/15/2017 Today's weight: 162 lbs Today's date: 04/29/2019 Total lbs lost to date: 18 Total lbs lost since last in-office visit: 1  Interim History: Rebecca Rogers has done very well maintaining her weight loss, and she has even lost another lb. She is doing more of the Category 2 plan and working on meal planning.  Subjective:   1. Vitamin D deficiency Rebecca Rogers is stable on Vit D, and she is due for labs.  2. Insulin resistance Rebecca Rogers continues to do well with weight and metformin. She is due for labs.  3. Other specified hypothyroidism Rebecca Rogers is on levothyroxine, and she denies palpitations. Her blood pressure is stable and she is due for labs.  4. At high risk for impaired function of liver Rebecca Rogers is at risk for impaired function of liver due to protein decrease at times.   Assessment/Plan:   1. Vitamin D deficiency Low Vitamin D level contributes to fatigue and are associated with obesity, breast, and colon cancer. We will refill prescription Vitamin D for 90 days with no refills. Anamarie will follow-up for routine testing of Vitamin D, at least 2-3 times per year to avoid over-replacement. We will check labs today.  - Vitamin B12 - VITAMIN D 25 Hydroxy (Vit-D Deficiency, Fractures)  2. Insulin resistance Graceanna will continue to work on weight loss, exercise, and decreasing simple carbohydrates to help decrease the risk of diabetes. We will check labs today. We will refill metformin for 90 days with no refills. Raylei agreed to follow-up with [name] as directed to closely monitor her progress.  - Vitamin  B12 - CBC with Differential/Platelet - Comprehensive metabolic panel - Hemoglobin A1c - Insulin, random - Lipid Panel With LDL/HDL Ratio - Folate  - metFORMIN (GLUCOPHAGE) 500 MG tablet; Take 1 tablet (500 mg total) by mouth daily with breakfast.  Dispense: 90 tablet; Refill: 0  3. Other specified hypothyroidism Patient with long-standing hypothyroidism, on levothyroxine therapy. She appears euthyroid. We will check labs today. Orders and follow up as documented in patient record.  - T3 - T4, free - TSH  4. At high risk for impaired function of liver Rebecca Rogers was given approximately 15 minutes of counseling today regarding prevention of impaired liver function. Rebecca Rogers was educated about her risk of developing NASH or even liver failure and advised that the only proven treatment for NAFLD was weight loss of at least 5-10% of body weight.   5. Class 1 obesity with serious comorbidity and body mass index (BMI) of 30.0 to 30.9 in adult, unspecified obesity type [name] is currently in the action stage of change. As such, her goal is to continue with weight loss efforts. She has agreed to the Category 2 Plan.   Behavioral modification strategies: increasing lean protein intake.  Rebecca Rogers has agreed to follow-up with our clinic in 12 weeks. She was informed of the importance of frequent follow-up visits to maximize her success with intensive lifestyle modifications for her multiple health conditions.   Rebecca Rogers was informed we would discuss her lab results at her next visit unless there is a critical issue that needs  to be addressed sooner. Rebecca Rogers agreed to keep her next visit at the agreed upon time to discuss these results.  Objective:   Blood pressure 122/76, pulse 70, temperature 98 F (36.7 C), temperature source Oral, height 5\' 6"  (1.676 m), weight 162 lb (73.5 kg), SpO2 100 %. Body mass index is 26.15 kg/m.  General: Cooperative, alert, well developed, in no acute distress. HEENT:  Conjunctivae and lids unremarkable. Cardiovascular: Regular rhythm.  Lungs: Normal work of breathing. Neurologic: No focal deficits.   Lab Results  Component Value Date   CREATININE 0.78 04/29/2019   BUN 11 04/29/2019   NA 142 04/29/2019   K 4.1 04/29/2019   CL 103 04/29/2019   CO2 26 04/29/2019   Lab Results  Component Value Date   ALT 12 04/29/2019   AST 17 04/29/2019   ALKPHOS 95 04/29/2019   BILITOT 0.3 04/29/2019   Lab Results  Component Value Date   HGBA1C 5.9 (H) 04/29/2019   HGBA1C 5.7 (H) 11/15/2018   HGBA1C 5.6 07/30/2018   HGBA1C 5.9 (H) 04/05/2018   HGBA1C 5.8 (H) 11/20/2017   Lab Results  Component Value Date   INSULIN 10.7 04/29/2019   INSULIN 4.2 11/15/2018   INSULIN 6.1 07/30/2018   INSULIN 7.8 04/05/2018   INSULIN 8.5 11/20/2017   Lab Results  Component Value Date   TSH 6.310 (H) 04/29/2019   Lab Results  Component Value Date   CHOL 230 (H) 04/29/2019   HDL 38 (L) 04/29/2019   LDLCALC 163 (H) 04/29/2019   TRIG 160 (H) 04/29/2019   CHOLHDL 6.0 (H) 06/22/2017   Lab Results  Component Value Date   WBC 5.9 04/29/2019   HGB 13.8 04/29/2019   HCT 41.7 04/29/2019   MCV 91 04/29/2019   PLT 375 04/29/2019   No results found for: IRON, TIBC, FERRITIN  Attestation Statements:   Reviewed by clinician on day of visit: allergies, medications, problem list, medical history, surgical history, family history, social history, and previous encounter notes.   I, Trixie Dredge, am acting as transcriptionist for Dennard Nip, MD.  I have reviewed the above documentation for accuracy and completeness, and I agree with the above. -  Dennard Nip, MD

## 2019-05-14 ENCOUNTER — Encounter (INDEPENDENT_AMBULATORY_CARE_PROVIDER_SITE_OTHER): Payer: Self-pay

## 2019-05-14 NOTE — Patient Instructions (Addendum)
test

## 2019-05-16 ENCOUNTER — Other Ambulatory Visit: Payer: Self-pay | Admitting: Surgical

## 2019-05-16 DIAGNOSIS — F419 Anxiety disorder, unspecified: Secondary | ICD-10-CM

## 2019-05-16 MED ORDER — SERTRALINE HCL 50 MG PO TABS: 75.0000 mg | ORAL_TABLET | Freq: Every day | ORAL | 1 refills | Status: DC

## 2019-07-29 ENCOUNTER — Ambulatory Visit (INDEPENDENT_AMBULATORY_CARE_PROVIDER_SITE_OTHER): Payer: 59 | Admitting: Family Medicine

## 2019-07-30 ENCOUNTER — Encounter (INDEPENDENT_AMBULATORY_CARE_PROVIDER_SITE_OTHER): Payer: Self-pay | Admitting: Physician Assistant

## 2019-07-30 ENCOUNTER — Ambulatory Visit (INDEPENDENT_AMBULATORY_CARE_PROVIDER_SITE_OTHER): Payer: 59 | Admitting: Physician Assistant

## 2019-07-30 ENCOUNTER — Other Ambulatory Visit: Payer: Self-pay

## 2019-07-30 VITALS — BP 119/75 | HR 67 | Temp 98.2°F | Ht 66.0 in | Wt 164.0 lb

## 2019-07-30 DIAGNOSIS — Z9189 Other specified personal risk factors, not elsewhere classified: Secondary | ICD-10-CM | POA: Diagnosis not present

## 2019-07-30 DIAGNOSIS — Z6830 Body mass index (BMI) 30.0-30.9, adult: Secondary | ICD-10-CM

## 2019-07-30 DIAGNOSIS — R7303 Prediabetes: Secondary | ICD-10-CM | POA: Diagnosis not present

## 2019-07-30 DIAGNOSIS — E669 Obesity, unspecified: Secondary | ICD-10-CM

## 2019-07-30 DIAGNOSIS — E559 Vitamin D deficiency, unspecified: Secondary | ICD-10-CM | POA: Diagnosis not present

## 2019-07-30 DIAGNOSIS — E7849 Other hyperlipidemia: Secondary | ICD-10-CM

## 2019-07-31 DIAGNOSIS — E7849 Other hyperlipidemia: Secondary | ICD-10-CM | POA: Diagnosis not present

## 2019-07-31 DIAGNOSIS — R7303 Prediabetes: Secondary | ICD-10-CM | POA: Diagnosis not present

## 2019-07-31 DIAGNOSIS — E559 Vitamin D deficiency, unspecified: Secondary | ICD-10-CM | POA: Diagnosis not present

## 2019-07-31 NOTE — Progress Notes (Signed)
Chief Complaint:   OBESITY Rebecca Rogers is here to discuss her progress with her obesity treatment plan along with follow-up of her obesity related diagnoses. Rebecca Rogers is on the Category 2 Plan and states she is following her eating plan approximately 70% of the time. Rebecca Rogers states she is exercising for 0 minutes 0 times per week.  Today's visit was #: 20 Starting weight: 180 lbs Starting date: 08/15/2017 Today's weight: 164 lbs Today's date: 07/30/2019 Total lbs lost to date: 16 lbs Total lbs lost since last in-office visit: 0  Interim History: Rebecca Rogers states that she has been following Category 2 on and off.  She is in an maintenance phase currently, but her goal is to get to 160 pounds.  She is not eating enough protein and tends to eat tomato or tomato and bacon sandwiches at dinner, as she is not very hungry at night.  Subjective:   1. Prediabetes Rebecca Rogers has a diagnosis of prediabetes based on her elevated HgA1c and was informed this puts her at greater risk of developing diabetes. She continues to work on diet and exercise to decrease her risk of diabetes. She denies nausea or hypoglycemia.  She is on metformin with no nausea, vomiting, or diarrhea.  She is due for labs.  Lab Results  Component Value Date   HGBA1C 5.9 (H) 04/29/2019   Lab Results  Component Value Date   INSULIN 10.7 04/29/2019   INSULIN 4.2 11/15/2018   INSULIN 6.1 07/30/2018   INSULIN 7.8 04/05/2018   INSULIN 8.5 11/20/2017   2. Vitamin D deficiency Rebecca Rogers's Vitamin D level was 69.2 on 04/29/2019. She is currently taking no vitamin D supplement. She has not taken vitamin D over the last week.  3. Other hyperlipidemia Rebecca Rogers has hyperlipidemia and has been trying to improve her cholesterol levels with intensive lifestyle modification including a low saturated fat diet, exercise and weight loss. She denies any chest pain, claudication or myalgias.  She is on no medications.  She believes her father may have high  cholesterol.  She is due for labs.  Lab Results  Component Value Date   ALT 12 04/29/2019   AST 17 04/29/2019   ALKPHOS 95 04/29/2019   BILITOT 0.3 04/29/2019   Lab Results  Component Value Date   CHOL 230 (H) 04/29/2019   HDL 38 (L) 04/29/2019   LDLCALC 163 (H) 04/29/2019   TRIG 160 (H) 04/29/2019   CHOLHDL 6.0 (H) 06/22/2017   4. At risk for diabetes mellitus Rebecca Rogers is at higher than average risk for developing diabetes due to her obesity.   Assessment/Plan:   1. Prediabetes Rebecca Rogers will continue to work on weight loss, exercise, and decreasing simple carbohydrates to help decrease the risk of diabetes.  Continue medications and check labs today.  - Comprehensive metabolic panel - Hemoglobin A1c - Insulin, random  2. Vitamin D deficiency Low Vitamin D level contributes to fatigue and are associated with obesity, breast, and colon cancer. She agrees to continue to take prescription Vitamin D @50 ,000 IU every week and will follow-up for routine testing of Vitamin D, at least 2-3 times per year to avoid over-replacement.  Stop vitamin D until next labs. - VITAMIN D 25 Hydroxy (Vit-D Deficiency, Fractures)  3. Other hyperlipidemia Cardiovascular risk and specific lipid/LDL goals reviewed.  We discussed several lifestyle modifications today and Rebecca Rogers will continue to work on diet, exercise and weight loss efforts. Orders and follow up as documented in patient record.  Check labs and  continue with plan.  Counseling Intensive lifestyle modifications are the first line treatment for this issue.  Dietary changes: Increase soluble fiber. Decrease simple carbohydrates.  Exercise changes: Moderate to vigorous-intensity aerobic activity 150 minutes per week if tolerated.  Lipid-lowering medications: see documented in medical record. - Lipid Panel With LDL/HDL Ratio  4. At risk for diabetes mellitus Rebecca Rogers was given approximately 15 minutes of diabetes education and counseling  today. We discussed intensive lifestyle modifications today with an emphasis on weight loss as well as increasing exercise and decreasing simple carbohydrates in her diet. We also reviewed medication options with an emphasis on risk versus benefit of those discussed.   Repetitive spaced learning was employed today to elicit superior memory formation and behavioral change.  5. Class 1 obesity with serious comorbidity and body mass index (BMI) of 30.0 to 30.9 in adult, unspecified obesity type Rebecca Rogers is currently in the action stage of change. As such, her goal is to maintain weight for now. She has agreed to the Category 2 Plan.   Exercise goals: For substantial health benefits, adults should do at least 150 minutes (2 hours and 30 minutes) a week of moderate-intensity, or 75 minutes (1 hour and 15 minutes) a week of vigorous-intensity aerobic physical activity, or an equivalent combination of moderate- and vigorous-intensity aerobic activity. Aerobic activity should be performed in episodes of at least 10 minutes, and preferably, it should be spread throughout the week.  Behavioral modification strategies: increasing lean protein intake and keeping healthy foods in the home.  Rebecca Rogers has agreed to follow-up with our clinic in 12 weeks. She was informed of the importance of frequent follow-up visits to maximize her success with intensive lifestyle modifications for her multiple health conditions.   Rebecca Rogers was informed we would discuss her lab results at her next visit unless there is a critical issue that needs to be addressed sooner. Rebecca Rogers agreed to keep her next visit at the agreed upon time to discuss these results.  Objective:   Blood pressure 119/75, pulse 67, temperature 98.2 F (36.8 C), temperature source Oral, height 5\' 6"  (1.676 m), weight 164 lb (74.4 kg), SpO2 99 %. Body mass index is 26.47 kg/m.  General: Cooperative, alert, well developed, in no acute distress. HEENT: Conjunctivae  and lids unremarkable. Cardiovascular: Regular rhythm.  Lungs: Normal work of breathing. Neurologic: No focal deficits.   Lab Results  Component Value Date   CREATININE 0.78 04/29/2019   BUN 11 04/29/2019   NA 142 04/29/2019   K 4.1 04/29/2019   CL 103 04/29/2019   CO2 26 04/29/2019   Lab Results  Component Value Date   ALT 12 04/29/2019   AST 17 04/29/2019   ALKPHOS 95 04/29/2019   BILITOT 0.3 04/29/2019   Lab Results  Component Value Date   HGBA1C 5.9 (H) 04/29/2019   HGBA1C 5.7 (H) 11/15/2018   HGBA1C 5.6 07/30/2018   HGBA1C 5.9 (H) 04/05/2018   HGBA1C 5.8 (H) 11/20/2017   Lab Results  Component Value Date   INSULIN 10.7 04/29/2019   INSULIN 4.2 11/15/2018   INSULIN 6.1 07/30/2018   INSULIN 7.8 04/05/2018   INSULIN 8.5 11/20/2017   Lab Results  Component Value Date   TSH 6.310 (H) 04/29/2019   Lab Results  Component Value Date   CHOL 230 (H) 04/29/2019   HDL 38 (L) 04/29/2019   LDLCALC 163 (H) 04/29/2019   TRIG 160 (H) 04/29/2019   CHOLHDL 6.0 (H) 06/22/2017   Lab Results  Component Value  Date   WBC 5.9 04/29/2019   HGB 13.8 04/29/2019   HCT 41.7 04/29/2019   MCV 91 04/29/2019   PLT 375 04/29/2019   Attestation Statements:   Reviewed by clinician on day of visit: allergies, medications, problem list, medical history, surgical history, family history, social history, and previous encounter notes.  I, Water quality scientist, CMA, am acting as transcriptionist for Abby Potash, PA-C  I have reviewed the above documentation for accuracy and completeness, and I agree with the above. Abby Potash, PA-C

## 2019-08-01 LAB — COMPREHENSIVE METABOLIC PANEL
ALT: 13 IU/L (ref 0–32)
AST: 18 IU/L (ref 0–40)
Albumin/Globulin Ratio: 1.5 (ref 1.2–2.2)
Albumin: 4.4 g/dL (ref 3.8–4.8)
Alkaline Phosphatase: 105 IU/L (ref 48–121)
BUN/Creatinine Ratio: 23 (ref 12–28)
BUN: 17 mg/dL (ref 8–27)
Bilirubin Total: 0.5 mg/dL (ref 0.0–1.2)
CO2: 27 mmol/L (ref 20–29)
Calcium: 9.9 mg/dL (ref 8.7–10.3)
Chloride: 98 mmol/L (ref 96–106)
Creatinine, Ser: 0.75 mg/dL (ref 0.57–1.00)
GFR calc Af Amer: 99 mL/min/{1.73_m2} (ref >59)
GFR calc non Af Amer: 86 mL/min/{1.73_m2} (ref >59)
Globulin, Total: 3 g/dL (ref 1.5–4.5)
Glucose: 109 mg/dL — ABNORMAL HIGH (ref 65–99)
Potassium: 4.3 mmol/L (ref 3.5–5.2)
Sodium: 140 mmol/L (ref 134–144)
Total Protein: 7.4 g/dL (ref 6.0–8.5)

## 2019-08-01 LAB — INSULIN, RANDOM: INSULIN: 12.2 u[IU]/mL (ref 2.6–24.9)

## 2019-08-01 LAB — HEMOGLOBIN A1C
Est. average glucose Bld gHb Est-mCnc: 126 mg/dL
Hgb A1c MFr Bld: 6 % — ABNORMAL HIGH (ref 4.8–5.6)

## 2019-08-01 LAB — LIPID PANEL WITH LDL/HDL RATIO
Cholesterol, Total: 244 mg/dL — ABNORMAL HIGH (ref 100–199)
HDL: 41 mg/dL (ref >39)
LDL Chol Calc (NIH): 169 mg/dL — ABNORMAL HIGH (ref 0–99)
LDL/HDL Ratio: 4.1 ratio — ABNORMAL HIGH (ref 0.0–3.2)
Triglycerides: 181 mg/dL — ABNORMAL HIGH (ref 0–149)
VLDL Cholesterol Cal: 34 mg/dL (ref 5–40)

## 2019-08-01 LAB — VITAMIN D 25 HYDROXY (VIT D DEFICIENCY, FRACTURES): Vit D, 25-Hydroxy: 53.6 ng/mL (ref 30.0–100.0)

## 2019-08-15 ENCOUNTER — Encounter: Payer: 59 | Admitting: Obstetrics and Gynecology

## 2019-08-19 ENCOUNTER — Other Ambulatory Visit (INDEPENDENT_AMBULATORY_CARE_PROVIDER_SITE_OTHER): Payer: Self-pay | Admitting: Physician Assistant

## 2019-08-19 DIAGNOSIS — E785 Hyperlipidemia, unspecified: Secondary | ICD-10-CM

## 2019-08-19 DIAGNOSIS — E8881 Metabolic syndrome: Secondary | ICD-10-CM

## 2019-08-19 DIAGNOSIS — E559 Vitamin D deficiency, unspecified: Secondary | ICD-10-CM

## 2019-08-19 DIAGNOSIS — E1169 Type 2 diabetes mellitus with other specified complication: Secondary | ICD-10-CM

## 2019-08-19 MED ORDER — METFORMIN HCL 500 MG PO TABS: 500.0000 mg | ORAL_TABLET | Freq: Two times a day (BID) | ORAL | 0 refills | Status: DC

## 2019-08-19 MED ORDER — VITAMIN D (ERGOCALCIFEROL) 1.25 MG (50000 UNIT) PO CAPS: 50000.0000 [IU] | ORAL_CAPSULE | ORAL | 0 refills | Status: DC

## 2019-08-19 MED ORDER — ATORVASTATIN CALCIUM 10 MG PO TABS: 10.0000 mg | ORAL_TABLET | Freq: Every day | ORAL | 0 refills | Status: DC

## 2019-09-08 IMAGING — US US BREAST*R* LIMITED INC AXILLA
1 series · 13 of 17 positions shown · non-contrast
Comparison: 05/25/2017 and earlier

CLINICAL DATA: Patient returns after screening study for evaluation
of possible RIGHT breast masses. History of ultrasound-guided core
biopsy of a mass in the 4 o'clock location of the RIGHT breast
showing apocrine cysts and papillary apocrine metaplasia.

EXAM:
DIGITAL DIAGNOSTIC RIGHT MAMMOGRAM WITH CAD AND TOMO
ULTRASOUND RIGHT BREAST

[Series 1: us breast*right* limited inc axilla · 0.07mm/px · 13 of 17 slices shown]
[im 1/17]
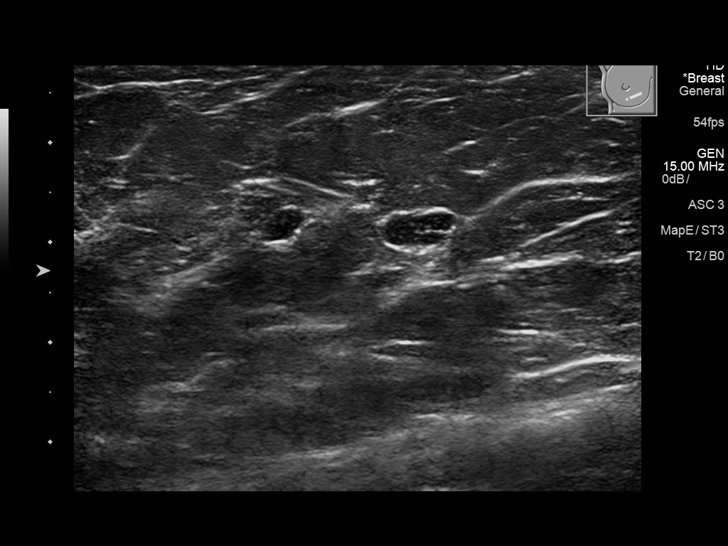
[im 2/17]
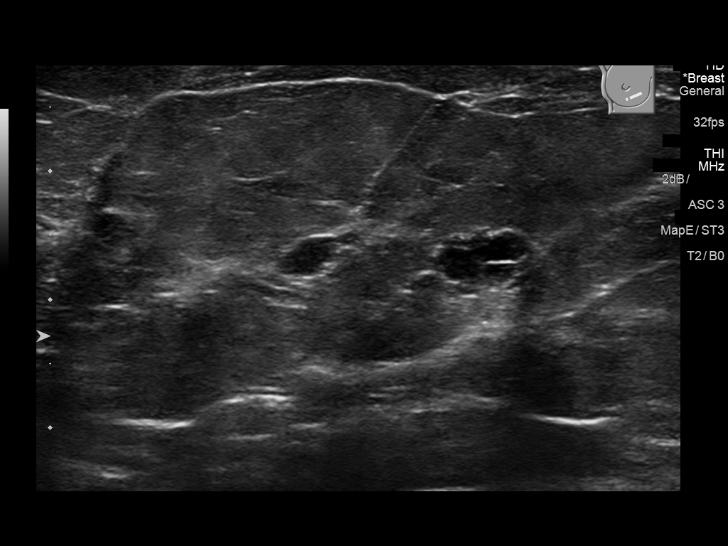
[im 4/17]
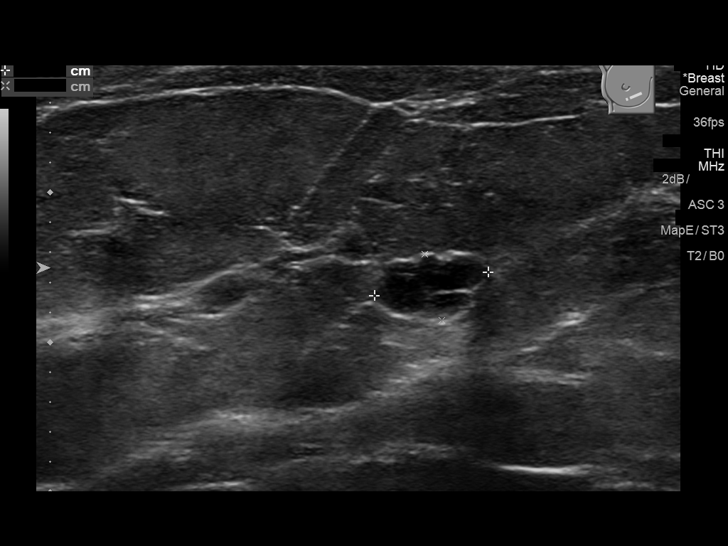
[im 5/17]
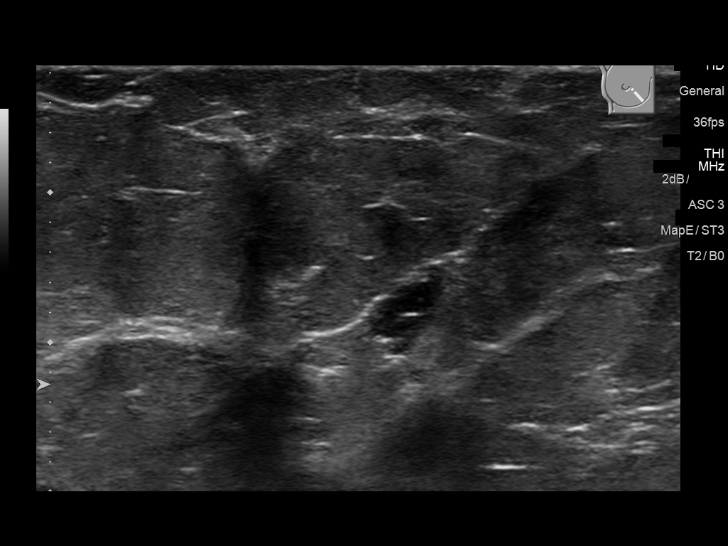
[im 6/17]
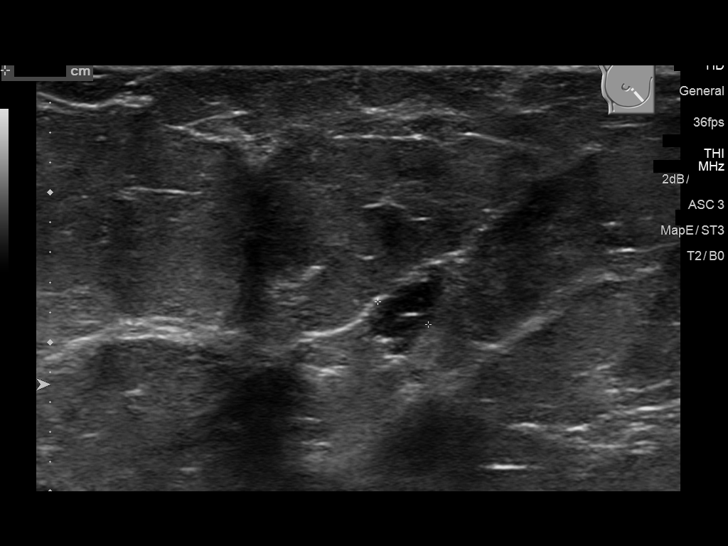
[im 8/17]
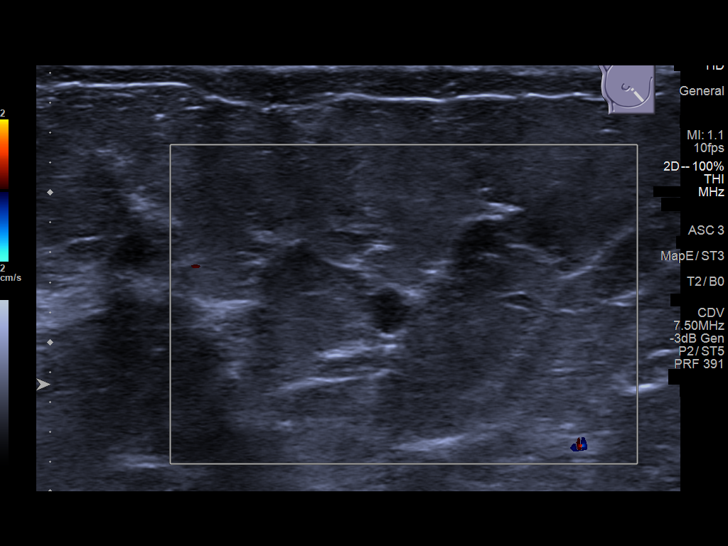
[im 9/17]
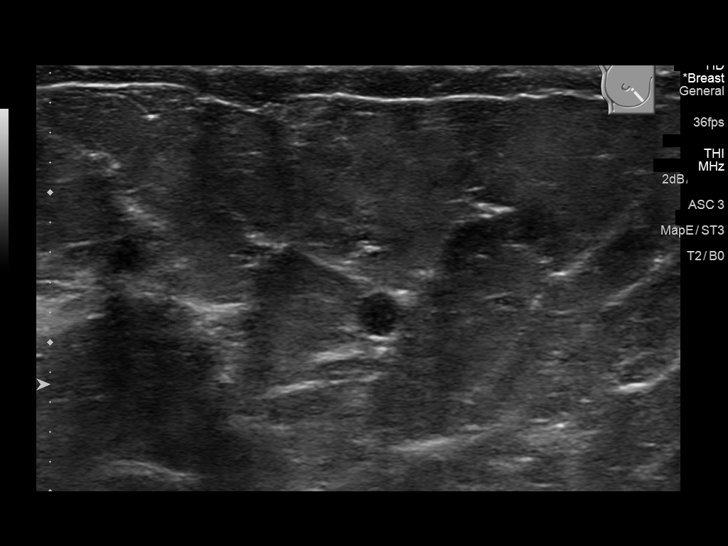
[im 10/17]
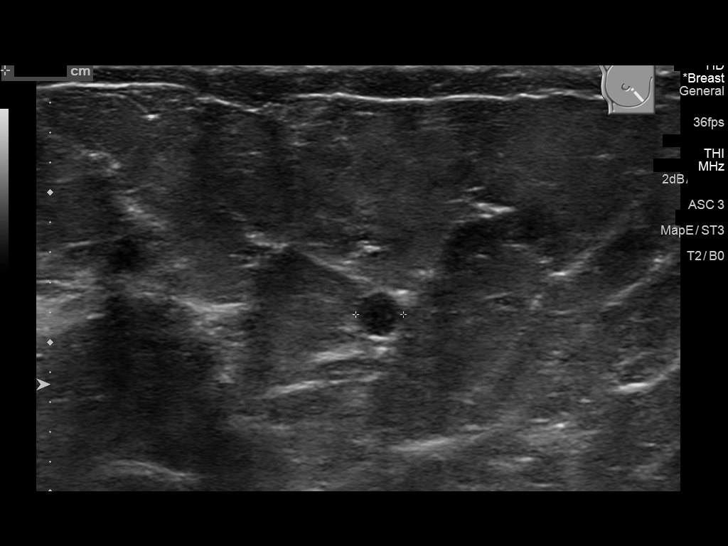
[im 12/17]
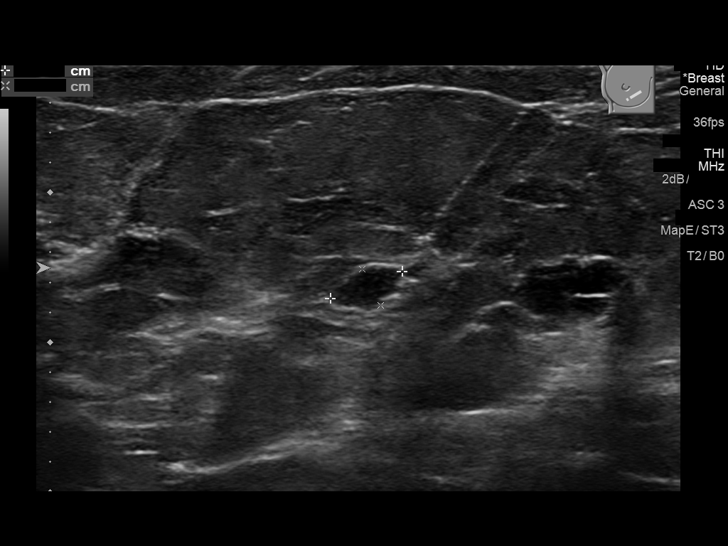
[im 13/17]
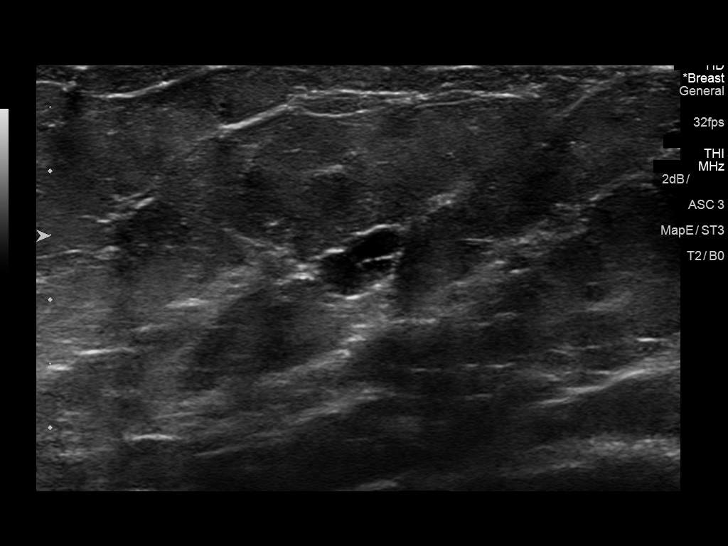
[im 14/17]
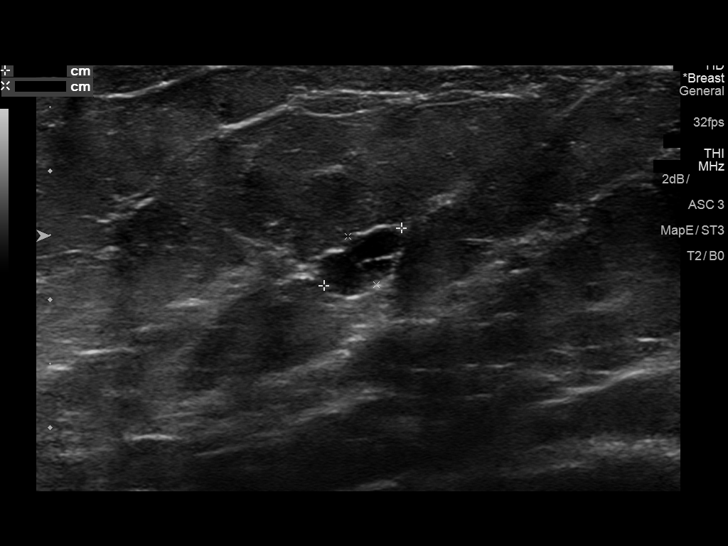
[im 16/17]
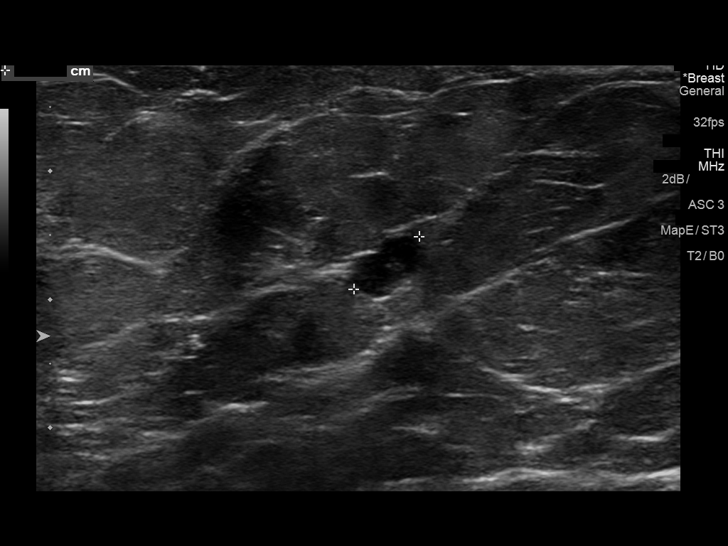
[im 17/17]
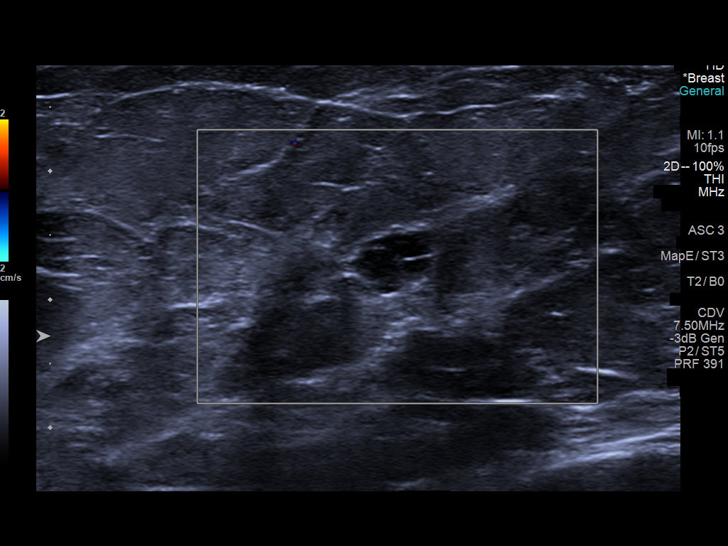

[13 of 17 positions shown; findings below may reference images not displayed]

ACR Breast Density Category b: There are scattered areas of
fibroglandular density.
FINDINGS: Additional 2-D and 3-D images are performed. These views confirm
presence of a circumscribed oval mass in the LOWER INNER QUADRANT of
the RIGHT breast, just posterior to a tissue marker clip placed the
time of previous benign biopsy. Other smaller circumscribed
low-density masses are also identified in this portion of the
breast.

Mammographic images were processed with CAD.

On physical exam, I palpate no abnormality in the LOWER INNER
QUADRANT of the RIGHT breast.

Targeted ultrasound is performed, showing numerous small near
anechoic circumscribed LOWER INNER QUADRANT of the RIGHT breast,
largest measuring approximately 0.8 x 0.5 x 0.4 centimeters. A
similar adjacent mass is 0.5 x 0.3 x 0.3 centimeters. No solid or
suspicious masses are identified in this portion of the breast.
IMPRESSION: Probable fibrocystic changes in the LOWER INNER QUADRANT of the
RIGHT breast.

RECOMMENDATION:
RIGHT breast ultrasound is recommended in 6 months to assess
stability.

I have discussed the findings and recommendations with the patient.
Results were also provided in writing at the conclusion of the
visit. If applicable, a reminder letter will be sent to the patient
regarding the next appointment.

BI-RADS CATEGORY  3: Probably benign.

## 2019-09-13 ENCOUNTER — Ambulatory Visit
Admission: EM | Admit: 2019-09-13 | Discharge: 2019-09-13 | Disposition: A | Discharge status: Home / Self Care | Payer: 59 | Attending: Emergency Medicine | Admitting: Emergency Medicine

## 2019-09-13 ENCOUNTER — Other Ambulatory Visit: Payer: 59

## 2019-09-13 DIAGNOSIS — R05 Cough: Secondary | ICD-10-CM | POA: Diagnosis not present

## 2019-09-13 DIAGNOSIS — R059 Cough, unspecified: Secondary | ICD-10-CM

## 2019-09-13 MED ORDER — BENZONATATE 100 MG PO CAPS: 100.0000 mg | ORAL_CAPSULE | Freq: Three times a day (TID) | ORAL | 0 refills | Status: DC | PRN

## 2019-09-13 NOTE — ED Triage Notes (Signed)
Patient reports her husband has a positive exposure to someone with COVID. Reports he gets his results today. Patient c/o gradual onset of non-productive cough since Tuesday. Patient is getting swabbed through health at work at [employer].

## 2019-09-13 NOTE — ED Provider Notes (Signed)
Rebecca Rogers    CSN: [id number] Arrival date & time: 09/13/19  2202      History   Chief Complaint Chief Complaint  Patient presents with  . Cough    HPI Rebecca Rogers is a 64 y.o. female.   Patient presents with 3-day history of nonproductive cough.  She reports her husband was exposed to someone with COVID.  She has a COVID test scheduled through health at work today.  She denies rash, fever, chills, shortness of breath, vomiting, diarrhea, or other symptoms.  Her medical history includes hypertension, hypothyroid, colon polyps, anxiety, shortness of breath on exertion.  The history is provided by the patient.    Past Medical History:  Diagnosis Date  . Adenomyosis   . Anxiety   . Cyst of right breast    4-5 oclock 1/2 cm  . HLD (hyperlipidemia)   . Hypertension    bordeline  . Hypothyroidism   . Increased BMI     Patient Active Problem List   Diagnosis Date Noted  . Polyp of sigmoid colon   . Vitamin D deficiency 01/11/2018  . Other fatigue 08/15/2017  . Shortness of breath on exertion 08/15/2017  . Hyperglycemia 08/15/2017  . Greater trochanteric bursitis of right hip 04/18/2017  . Right hip pain 03/06/2017  . Radiculitis of right cervical region 02/06/2017  . 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 Perineal folliculitis 54/27/0623  . Hypothyroidism 04/14/2016  . Joint pain 04/14/2016  . Hx of colonic polyps   . Benign neoplasm of transverse colon   . Personal history of colonic polyps   . Benign neoplasm of ascending colon   . Benign neoplasm of cecum   . Benign neoplasm of sigmoid colon   . Anxiety 04/07/2015  . Status post bilateral salpingo-oophorectomy (BSO) 04/07/2015  . Status post laparoscopic supracervical hysterectomy 04/07/2015  . Family history of ovarian cancer 04/07/2015  . Increased BMI 04/07/2015  . Essential hypertension 04/07/2015    Past Surgical History:  Procedure Laterality Date  . ABDOMINAL HYSTERECTOMY     lsh- adenomysis and  fibroids  . BREAST BIOPSY Right 2016   NEG  . COLONOSCOPY WITH PROPOFOL N/A 06/05/2015   Procedure: COLONOSCOPY WITH PROPOFOL;  Surgeon: Lucilla Lame, MD;  Location: [place];  Service: Endoscopy;  Laterality: N/A;  . COLONOSCOPY WITH PROPOFOL N/A 12/04/2015   Procedure: COLONOSCOPY WITH PROPOFOL;  Surgeon: Lucilla Lame, MD;  Location: [place];  Service: Endoscopy;  Laterality: N/A;  . COLONOSCOPY WITH PROPOFOL N/A 12/21/2018   Procedure: COLONOSCOPY WITH PROPOFOL;  Surgeon: Lucilla Lame, MD;  Location: [facility];  Service: Endoscopy;  Laterality: N/A;  . LAPAROSCOPIC SALPINGOOPHERECTOMY     prophylactic  . POLYPECTOMY  06/05/2015   Procedure: POLYPECTOMY;  Surgeon: Lucilla Lame, MD;  Location: [place];  Service: Endoscopy;;  . POLYPECTOMY  12/04/2015   Procedure: POLYPECTOMY;  Surgeon: Lucilla Lame, MD;  Location: [place];  Service: Endoscopy;;  . TONSILLECTOMY  1965    OB History    Gravida  0   Para  0   Term  0   Preterm  0   AB  0   Living  0     SAB  0   TAB  0   Ectopic  0   Multiple  0   Live Births               Home Medications    Prior to Admission medications   Medication  Sig Start Date End Date Taking? Authorizing Provider  atorvastatin (LIPITOR) 10 MG tablet Take 1 tablet (10 mg total) by mouth at bedtime. 08/19/19   Abby Potash, PA-C  benzonatate (TESSALON) 100 MG capsule Take 1 capsule (100 mg total) by mouth 3 (three) times daily as needed for cough. 09/13/19   Sharion Balloon, NP  levothyroxine (SYNTHROID) 50 MCG tablet Take 0.5 tablets (25 mcg total) by mouth daily before breakfast. 05/10/18   Harlin Heys, MD  metFORMIN (GLUCOPHAGE) 500 MG tablet Take 1 tablet (500 mg total) by mouth 2 (two) times daily with a meal. 08/19/19   Abby Potash, PA-C  nystatin-triamcinolone (MYCOLOG II) cream Apply 1 application topically 2 (two) times daily. 08/10/17   Defrancesco, Alanda Slim, MD  sertraline (ZOLOFT)  50 MG tablet Take 1.5 tablets (75 mg total) by mouth daily. 1 & 1/2 tab daily 05/16/19   Harlin Heys, MD  triamterene-hydrochlorothiazide (MAXZIDE-25) 37.5-25 MG tablet Take 1 tablet by mouth daily. 12/17/18   Harlin Heys, MD  Vitamin D, Ergocalciferol, (DRISDOL) 1.25 MG (50000 UNIT) CAPS capsule Take 1 capsule (50,000 Units total) by mouth every 7 (seven) days. 08/19/19   Abby Potash, PA-C    Family History Family History  Problem Relation Age of Onset  . Ovarian cancer Mother   . Prostate cancer Father   . Breast cancer Neg Hx   . Colon cancer Neg Hx   . Diabetes Neg Hx   . Heart disease Neg Hx     Social History Social History   Tobacco Use  . Smoking status: Never Smoker  . Smokeless tobacco: Never Used  Vaping Use  . Vaping Use: Never used  Substance Use Topics  . Alcohol use: No  . Drug use: No     Allergies   Patient has no known allergies.   Review of Systems Review of Systems  Constitutional: Negative for chills and fever.  HENT: Negative for ear pain and sore throat.   Eyes: Negative for pain and visual disturbance.  Respiratory: Positive for cough. Negative for shortness of breath.   Cardiovascular: Negative for chest pain and palpitations.  Gastrointestinal: Negative for abdominal pain, diarrhea and vomiting.  Genitourinary: Negative for dysuria and hematuria.  Musculoskeletal: Negative for arthralgias and back pain.  Skin: Negative for color change and rash.  Neurological: Negative for seizures and syncope.  All other systems reviewed and are negative.    Physical Exam Triage Vital Signs ED Triage Vitals  Enc Vitals Group     BP      Pulse      Resp      Temp      Temp src      SpO2      Weight      Height      Head Circumference      Peak Flow      Pain Score      Pain Loc      Pain Edu?      Excl. in [place]?    No data found.  Updated Vital Signs BP 128/80   Pulse (!) 105   Temp 100 F (37.8 C)   Resp 16   SpO2  95%   Visual Acuity Right Eye Distance:   Left Eye Distance:   Bilateral Distance:    Right Eye Near:   Left Eye Near:    Bilateral Near:     Physical Exam Vitals and nursing note reviewed.  Constitutional:  General: She is not in acute distress.    Appearance: She is well-developed. She is not ill-appearing.  HENT:     Head: Normocephalic and atraumatic.     Right Ear: Tympanic membrane normal.     Left Ear: Tympanic membrane normal.     Nose: Nose normal.     Mouth/Throat:     Mouth: Mucous membranes are moist.     Pharynx: Oropharynx is clear.  Eyes:     Conjunctiva/sclera: Conjunctivae normal.  Cardiovascular:     Rate and Rhythm: Normal rate and regular rhythm.     Heart sounds: No murmur heard.   Pulmonary:     Effort: Pulmonary effort is normal. No respiratory distress.     Breath sounds: Normal breath sounds. No wheezing, rhonchi or rales.  Abdominal:     Palpations: Abdomen is soft.     Tenderness: There is no abdominal tenderness. There is no guarding or rebound.  Musculoskeletal:     Cervical back: Neck supple.  Skin:    General: Skin is warm and dry.     Findings: No rash.  Neurological:     General: No focal deficit present.     Mental Status: She is alert and oriented to person, place, and time.     Gait: Gait normal.  Psychiatric:        Mood and Affect: Mood normal.        Behavior: Behavior normal.      UC Treatments / Results  Labs (all labs ordered are listed, but only abnormal results are displayed) Labs Reviewed - No data to display  EKG   Radiology No results found.  Procedures Procedures (including critical care time)  Medications Ordered in UC Medications - No data to display  Initial Impression / Assessment and Plan / UC Course  I have reviewed the triage vital signs and the nursing notes.  Pertinent labs & imaging results that were available during my care of the patient were reviewed by me and considered in my  medical decision making (see chart for details).   Cough.  Patient declined COVID test here; she has test scheduled through health at work.  Treating cough with Tessalon Perles.  Instructed patient to follow-up with her PCP if her symptoms or not improving.  Instructed her to self quarantine until her COVID test result is back.  Patient agrees to plan of care.   Final Clinical Impressions(s) / UC Diagnoses   Final diagnoses:  Cough     Discharge Instructions     Take the Tessalon Perles as needed for cough.    Follow up with your primary care provider if your symptoms are not improving.       ED Prescriptions    Medication Sig Dispense Auth. Provider   benzonatate (TESSALON) 100 MG capsule Take 1 capsule (100 mg total) by mouth 3 (three) times daily as needed for cough. 21 capsule Sharion Balloon, NP     PDMP not reviewed this encounter.   Sharion Balloon, NP 09/13/19 [phone]

## 2019-09-13 NOTE — Discharge Instructions (Signed)
Take the Tessalon Perles as needed for cough.  Follow up with your primary care provider if your symptoms are not improving.    

## 2019-09-26 ENCOUNTER — Telehealth: Payer: Self-pay

## 2019-09-26 ENCOUNTER — Telehealth: Payer: 59 | Admitting: Emergency Medicine

## 2019-09-26 DIAGNOSIS — G933 Postviral fatigue syndrome: Secondary | ICD-10-CM

## 2019-09-26 DIAGNOSIS — G9331 Postviral fatigue syndrome: Secondary | ICD-10-CM

## 2019-09-26 NOTE — Telephone Encounter (Signed)
Pt has a new pt appt 11/29. However, she was positive for covid on sept 10. Health at work released her back to work today and pt states she is exhausted. She is wanting to know if she can be seen for her exhaustion, and possibly get a work note written.

## 2019-09-26 NOTE — Progress Notes (Signed)
E-Visit for Fatigue  We are sorry you are not feeling well. We are here to help!  It's very common to feel fatigued after having had COVID-19.  These symptoms can persist for weeks and in some cases months.  I can write you a note for a few more days out of work, but if you need longer, you'll need to discuss your symptoms with your doctor.  The following symptoms may appear 2-14 days after exposure: . Fever . Cough . Shortness of breath or difficulty breathing . Chills . Repeated shaking with chills . Muscle pain . Headache . Sore throat . New loss of taste or smell . Fatigue . Congestion or runny nose . Nausea or vomiting . Diarrhea  Go to the nearest hospital ED for assessment if fever/cough/breathlessness are severe or illness seems like a threat to life.  It is vitally important that if you feel that you have an infection such as this virus or any other virus that you stay home and away from places where you may spread it to others.  You should avoid contact with people age 68 and older.    You may also take acetaminophen (Tylenol) as needed for fever.  Reduce your risk of any infection by using the same precautions used for avoiding the common cold or flu:  Marland Kitchen Wash your hands often with soap and warm water for at least 20 seconds.  If soap and water are not readily available, use an alcohol-based hand sanitizer with at least 60% alcohol.  . If coughing or sneezing, cover your mouth and nose by coughing or sneezing into the elbow areas of your shirt or coat, into a tissue or into your sleeve (not your hands). . Avoid shaking hands with others and consider head nods or verbal greetings only. . Avoid touching your eyes, nose, or mouth with unwashed hands.  . Avoid close contact with people who are sick. . Avoid places or events with large numbers of people in one location, like concerts or sporting events. . Carefully consider travel plans you have or are making. . If you are  planning any travel outside or inside the [name], visit the CDC's Travelers' Health webpage for the latest health notices. . If you have some symptoms but not all symptoms, continue to monitor at home and seek medical attention if your symptoms worsen. . If you are having a medical emergency, call 911.  HOME CARE . Only take medications as instructed by your medical team. . Drink plenty of fluids and get plenty of rest. . A steam or ultrasonic humidifier can help if you have congestion.   GET HELP RIGHT AWAY IF YOU HAVE EMERGENCY WARNING SIGNS** FOR COVID-19. If you or someone is showing any of these signs seek emergency medical care immediately. Call 911 or proceed to your closest emergency facility if: . You develop worsening high fever. . Trouble breathing . Bluish lips or face . Persistent pain or pressure in the chest . New confusion . Inability to wake or stay awake . You cough up blood. . Your symptoms become more severe  **This list is not all possible symptoms. Contact your medical provider for any symptoms that are sever or concerning to you.  MAKE SURE YOU   Understand these instructions.  Will watch your condition.  Will get help right away if you are not doing well or get worse.  Your e-visit answers were reviewed by a board certified advanced clinical practitioner to complete your  personal care plan.  Depending on the condition, your plan could have included both over the counter or prescription medications.  If there is a problem please reply once you have received a response from your provider.  Your safety is important to [name].  If you have drug allergies check your prescription carefully.    You can use MyChart to ask questions about today's visit, request a non-urgent call back, or ask for a work or school excuse for 24 hours related to this e-Visit. If it has been greater than 24 hours you will need to follow up with your provider, or enter a new e-Visit to address  those concerns. You will get an e-mail in the next two days asking about your experience.  I hope that your e-visit has been valuable and will speed your recovery. Thank you for using e-visits.    Greater than 5 minutes, yet less than 10 minutes was used in reviewing the patient's chart, questionnaire, prescribing medications, and documentation for this visit.

## 2019-09-26 NOTE — Telephone Encounter (Signed)
See below

## 2019-09-27 ENCOUNTER — Encounter: Payer: Self-pay | Admitting: Medical

## 2019-09-27 ENCOUNTER — Telehealth (INDEPENDENT_AMBULATORY_CARE_PROVIDER_SITE_OTHER): Payer: 59 | Admitting: Medical

## 2019-09-27 ENCOUNTER — Other Ambulatory Visit: Payer: Self-pay

## 2019-09-27 ENCOUNTER — Ambulatory Visit: Payer: Self-pay

## 2019-09-27 VITALS — BP 122/82 | HR 72 | Temp 98.2°F | Ht 66.0 in | Wt 161.0 lb

## 2019-09-27 DIAGNOSIS — U071 COVID-19: Secondary | ICD-10-CM

## 2019-09-27 DIAGNOSIS — I10 Essential (primary) hypertension: Secondary | ICD-10-CM

## 2019-09-27 DIAGNOSIS — E038 Other specified hypothyroidism: Secondary | ICD-10-CM

## 2019-09-27 DIAGNOSIS — R5383 Other fatigue: Secondary | ICD-10-CM

## 2019-09-27 LAB — CBC WITH DIFFERENTIAL/PLATELET
Basophils Absolute: 0 10*3/uL (ref 0.0–0.2)
Basos: 1 %
EOS (ABSOLUTE): 0.5 10*3/uL — ABNORMAL HIGH (ref 0.0–0.4)
Eos: 7 %
Hematocrit: 43.1 % (ref 34.0–46.6)
Hemoglobin: 14.7 g/dL (ref 11.1–15.9)
Immature Grans (Abs): 0 10*3/uL (ref 0.0–0.1)
Immature Granulocytes: 0 %
Lymphocytes Absolute: 2.7 10*3/uL (ref 0.7–3.1)
Lymphs: 37 %
MCH: 30.8 pg (ref 26.6–33.0)
MCHC: 34.1 g/dL (ref 31.5–35.7)
MCV: 90 fL (ref 79–97)
Monocytes Absolute: 0.7 10*3/uL (ref 0.1–0.9)
Monocytes: 9 %
Neutrophils Absolute: 3.3 10*3/uL (ref 1.4–7.0)
Neutrophils: 46 %
Platelets: 477 10*3/uL — ABNORMAL HIGH (ref 150–450)
RBC: 4.77 x10E6/uL (ref 3.77–5.28)
RDW: 13.1 % (ref 11.7–15.4)
WBC: 7.2 10*3/uL (ref 3.4–10.8)

## 2019-09-27 LAB — BASIC METABOLIC PANEL
BUN/Creatinine Ratio: 20 (ref 12–28)
BUN: 16 mg/dL (ref 8–27)
CO2: 27 mmol/L (ref 20–29)
Calcium: 10.8 mg/dL — ABNORMAL HIGH (ref 8.7–10.3)
Chloride: 101 mmol/L (ref 96–106)
Creatinine, Ser: 0.81 mg/dL (ref 0.57–1.00)
GFR calc Af Amer: 90 mL/min/{1.73_m2} (ref >59)
GFR calc non Af Amer: 78 mL/min/{1.73_m2} (ref >59)
Glucose: 90 mg/dL (ref 65–99)
Potassium: 4.8 mmol/L (ref 3.5–5.2)
Sodium: 141 mmol/L (ref 134–144)

## 2019-09-27 LAB — POCT URINALYSIS DIP (PROADVANTAGE DEVICE)
Bilirubin, UA: NEGATIVE
Blood, UA: NEGATIVE
Glucose, UA: NEGATIVE mg/dL
Ketones, POC UA: NEGATIVE mg/dL
Leukocytes, UA: NEGATIVE
Nitrite, UA: NEGATIVE
Protein Ur, POC: NEGATIVE mg/dL
Specific Gravity, Urine: 1.015
Urobilinogen, Ur: NEGATIVE
pH, UA: 8 (ref 5.0–8.0)

## 2019-09-27 MED ORDER — EMERGEN-C IMMUNE PLUS PO PACK: 1.0000 | PACK | Freq: Two times a day (BID) | ORAL | 0 refills | Status: DC

## 2019-09-27 NOTE — Progress Notes (Signed)
Subjective:     Patient ID: Rebecca Rogers, female   DOB: 1955/01/30, 64 y.o.   MRN: [id number]  HPI Chief Complaint  Patient presents with  . New Patient (Initial Visit)  . Fatigue    tested postive for covid 09/15/19   New patient today.  Had + covid test 09/15/19.   First day of symptoms was 09/11/2019.  Employee health sent her back to work this Tuesday. When she went back, the providers she works with felt she was sent back too soon.  Works at a [employer].    Current symptoms include severe fatigue, weakness, some cough, but improved compared to prior.  Had some chills even yesterday.   Went straight to bed after work yesterday.   Has had low grade fever.  No shortness breath or wheezing.   No coughing up blood.  No nausea, vomiting or diarrhea.  She notes that she has done very well with hydration.  Both her and husband have had covid and both are hydration.  Loss taste or smell.   Husband got infusion therapy but she didn't.    She is on several medication, and compliant with medications  Works with healthy weight and wellness, medical scribe.    Been using Tessalon Perles for cough, tylenol for fever.    Urine is clear.  No calf pain, no calf swelling.  No abdominal pain.  No back pain.  No other aggravating or relieving factors. No other complaint.  Past Medical History:  Diagnosis Date  . Adenomyosis   . Anxiety   . Cyst of right breast    4-5 oclock 1/2 cm  . HLD (hyperlipidemia)   . Hypertension    bordeline  . Hypothyroidism   . Increased BMI      Review of Systems As in subjective    Objective:   Physical Exam BP 122/82   Pulse 72   Temp 98.2 F (36.8 C)   Ht 5\' 6"  (1.676 m)   Wt 161 lb (73 kg)   SpO2 97%   BMI 25.99 kg/m   Wt Readings from Last 3 Encounters:  09/27/19 161 lb (73 kg)  07/30/19 164 lb (74.4 kg)  04/29/19 162 lb (73.5 kg)   General appearence: alert, no distress, WD/WN, ill/fatigued appearing HEENT: normocephalic, sclerae  anicteric, TMs pearly, nares patent, no discharge or erythema, pharynx normal Oral cavity: MMM, no lesions Neck: supple, no lymphadenopathy, no thyromegaly, no masses Heart: RRR, normal S1, S2, no murmurs Lungs: decreased breath sounds, no wheezes, rhonchi, or rales Abdomen: +bs, soft, non tender, non distended, no masses, no hepatomegaly, no splenomegaly Pulses: 2+ symmetric, upper and lower extremities, normal cap refill No edema     Assessment:     Encounter Diagnoses  Name Primary?  . COVID-19 virus infection Yes  . Fatigue, unspecified type   . Essential hypertension   . Other specified hypothyroidism        Plan:     We discussed her recent covid infection.  She is improved slowly, verify featured,  Vitals ok.  Labs today for further eval.    C/t rest, hydration, begin vitamins below, and we will call with results. Advised symptoms will slowly improved. Gave note for work to give more time to recover.  Rebecca Rogers was seen today for new patient (initial visit) and fatigue.  Diagnoses and all orders for this visit:  COVID-19 virus infection -     Basic metabolic panel -     CBC  with Differential/Platelet -     POCT Urinalysis DIP (Proadvantage Device)  Fatigue, unspecified type -     Basic metabolic panel -     CBC with Differential/Platelet -     POCT Urinalysis DIP (Proadvantage Device) -     Urine Culture  Essential hypertension  Other specified hypothyroidism  Other orders -     Multiple Vitamins-Minerals (EMERGEN-C IMMUNE PLUS) PACK; Take 1 tablet by mouth 2 (two) times daily.  f/u pending labs

## 2019-09-29 LAB — URINE CULTURE

## 2019-10-01 NOTE — Telephone Encounter (Signed)
I wont be able to write a work note until she is established here. The earliest I can see that we could work her in would be 10/8 at 11:00am.  Recommend she follow up with current pcp for work note if she is able to.  Algis Greenhouse. Jerline Pain, MD 10/01/2019 10:13 AM

## 2019-10-01 NOTE — Telephone Encounter (Signed)
See below, and schedule pt if she can do 10/08 per Dr. Jerline Pain.

## 2019-10-01 NOTE — Telephone Encounter (Signed)
Please schedule with pt.  Thank you

## 2019-10-02 NOTE — Telephone Encounter (Signed)
Pt is scheduled °

## 2019-10-08 ENCOUNTER — Telehealth: Payer: Self-pay | Admitting: Medical

## 2019-10-08 NOTE — Telephone Encounter (Signed)
I received FMLA request.  Please find out how many days she missed total.  I need specific dates for the [facility]

## 2019-10-08 NOTE — Telephone Encounter (Signed)
She states that health at work took her out sept 13th to the 20th and then You took her out Sept the 24th to the 30th

## 2019-10-09 NOTE — Telephone Encounter (Signed)
FMLA copy was made and gave to [facility] to charge the fee.

## 2019-10-09 NOTE — Telephone Encounter (Signed)
FMLA paperwork has been completed.  Please SCAN copy for us.  Return form to patient or employer as requested.   ? ?Charge the customary fee for FMLA form completion.  ?

## 2019-10-11 ENCOUNTER — Encounter: Payer: Self-pay | Admitting: Family Medicine

## 2019-10-11 ENCOUNTER — Other Ambulatory Visit: Payer: Self-pay

## 2019-10-11 ENCOUNTER — Other Ambulatory Visit: Payer: Self-pay | Admitting: Family Medicine

## 2019-10-11 ENCOUNTER — Ambulatory Visit (INDEPENDENT_AMBULATORY_CARE_PROVIDER_SITE_OTHER): Payer: 59 | Admitting: Family Medicine

## 2019-10-11 VITALS — BP 110/65 | HR 80 | Temp 98.1°F | Ht 66.0 in | Wt 166.0 lb

## 2019-10-11 DIAGNOSIS — E559 Vitamin D deficiency, unspecified: Secondary | ICD-10-CM

## 2019-10-11 DIAGNOSIS — R739 Hyperglycemia, unspecified: Secondary | ICD-10-CM

## 2019-10-11 DIAGNOSIS — Z23 Encounter for immunization: Secondary | ICD-10-CM

## 2019-10-11 DIAGNOSIS — E038 Other specified hypothyroidism: Secondary | ICD-10-CM | POA: Diagnosis not present

## 2019-10-11 DIAGNOSIS — F419 Anxiety disorder, unspecified: Secondary | ICD-10-CM | POA: Diagnosis not present

## 2019-10-11 DIAGNOSIS — E785 Hyperlipidemia, unspecified: Secondary | ICD-10-CM | POA: Insufficient documentation

## 2019-10-11 DIAGNOSIS — I10 Essential (primary) hypertension: Secondary | ICD-10-CM

## 2019-10-11 MED ORDER — SERTRALINE HCL 50 MG PO TABS: 75.0000 mg | ORAL_TABLET | Freq: Every day | ORAL | 3 refills | Status: DC

## 2019-10-11 MED ORDER — TRIAMTERENE-HCTZ 37.5-25 MG PO TABS: 1.0000 | ORAL_TABLET | Freq: Every day | ORAL | 3 refills | Status: DC

## 2019-10-11 NOTE — Assessment & Plan Note (Signed)
-

## 2019-10-11 NOTE — Assessment & Plan Note (Signed)
Continue vitamin D replacement for healthy weight loss.

## 2019-10-11 NOTE — Assessment & Plan Note (Signed)
Stable.  Continue Metformin 500 mg twice daily.

## 2019-10-11 NOTE — Assessment & Plan Note (Signed)
At goal.  Continue Maxide 37.5-25 once daily.

## 2019-10-11 NOTE — Patient Instructions (Signed)
It was very nice to see you today!  You can stop taking Synthroid.  We will need to continue to monitor your thyroid numbers.  We will do your flu vaccine today.  I will see back in year for your next annual checkup.  Please come back to see me sooner if needed.  Take care, Dr Jerline Pain  Please try these tips to maintain a healthy lifestyle:   Eat at least 3 REAL meals and 1-2 snacks per day.  Aim for no more than 5 hours between eating.  If you eat breakfast, please do so within one hour of getting up.    Each meal should contain half fruits/vegetables, one quarter protein, and one quarter carbs (no bigger than a computer mouse)   Cut down on sweet beverages. This includes juice, soda, and sweet tea.     Drink at least 1 glass of water with each meal and aim for at least 8 glasses per day   Exercise at least 150 minutes every week.

## 2019-10-11 NOTE — Assessment & Plan Note (Signed)
Has not been taking Synthroid.  Appears to have subclinical hyperthyroidism based on last set of labs.  We will continue monitoring TSH at this point.  Will take Synthroid off med list.

## 2019-10-11 NOTE — Progress Notes (Signed)
Rebecca Rogers is a 64 y.o. female who presents today for an office visit.  Assessment/Plan:  Chronic Problems Addressed Today: Essential hypertension At goal.  Continue Maxide 37.5-25 once daily.  Anxiety Stable.  Continue Zoloft 75 mg daily.  Refill sent in today.  Dyslipidemia Continue Lipitor 10 mg daily.  Vitamin D deficiency Continue vitamin D replacement for healthy weight loss.  Hyperglycemia Stable.  Continue Metformin 500 mg twice daily.  Hypothyroidism Has not been taking Synthroid.  Appears to have subclinical hyperthyroidism based on last set of labs.  We will continue monitoring TSH at this point.  Will take Synthroid off med list.  Flu vaccine was given today.  She will check with insurance about shingles vaccine.  Follow-up in 1 year.    Subjective:  HPI:  Patient is here to establish care today.  She is doing well and has no acute complaints.  She was diagnosed with Covid last month and was sick for several weeks.  She has essentially fully recovered and is doing well today.  Please see A/P for status of chronic conditions.  PMH:  The following were reviewed and entered/updated in epic: Past Medical History:  Diagnosis Date  . Adenomyosis   . Anxiety   . Cyst of right breast    4-5 oclock 1/2 cm  . HLD (hyperlipidemia)   . Hypertension    bordeline  . Hypothyroidism   . Increased BMI    Patient Active Problem List   Diagnosis Date Noted  . Dyslipidemia 10/11/2019  . Polyp of sigmoid colon   . Vitamin D deficiency 01/11/2018  . Shortness of breath on exertion 08/15/2017  . Hyperglycemia 08/15/2017  . 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 Hypothyroidism 04/14/2016  . Hx of colonic polyps   . Benign neoplasm of transverse colon   . Personal history of colonic polyps   . Benign neoplasm of ascending colon   . Benign neoplasm of cecum   . Benign neoplasm of sigmoid colon   . Anxiety 04/07/2015  . Status post bilateral salpingo-oophorectomy (BSO)  04/07/2015  . Status post laparoscopic supracervical hysterectomy 04/07/2015  . Family history of ovarian cancer 04/07/2015  . Essential hypertension 04/07/2015   Past Surgical History:  Procedure Laterality Date  . ABDOMINAL HYSTERECTOMY     lsh- adenomysis and fibroids  . BREAST BIOPSY Right 2016   NEG  . COLONOSCOPY WITH PROPOFOL N/A 06/05/2015   Procedure: COLONOSCOPY WITH PROPOFOL;  Surgeon: Lucilla Lame, MD;  Location: [place];  Service: Endoscopy;  Laterality: N/A;  . COLONOSCOPY WITH PROPOFOL N/A 12/04/2015   Procedure: COLONOSCOPY WITH PROPOFOL;  Surgeon: Lucilla Lame, MD;  Location: [place];  Service: Endoscopy;  Laterality: N/A;  . COLONOSCOPY WITH PROPOFOL N/A 12/21/2018   Procedure: COLONOSCOPY WITH PROPOFOL;  Surgeon: Lucilla Lame, MD;  Location: [facility] ENDOSCOPY;  Service: Endoscopy;  Laterality: N/A;  . LAPAROSCOPIC SALPINGOOPHERECTOMY     prophylactic  . POLYPECTOMY  06/05/2015   Procedure: POLYPECTOMY;  Surgeon: Lucilla Lame, MD;  Location: [place];  Service: Endoscopy;;  . POLYPECTOMY  12/04/2015   Procedure: POLYPECTOMY;  Surgeon: Lucilla Lame, MD;  Location: [place];  Service: Endoscopy;;  . TONSILLECTOMY  1965    Family History  Problem Relation Age of Onset  . Ovarian cancer Mother   . Prostate cancer Father   . Breast cancer Neg Hx   . Colon cancer Neg Hx   . Diabetes Neg Hx   . Heart disease Neg  Hx     Medications- reviewed and updated Current Outpatient Medications  Medication Sig Dispense Refill  . atorvastatin (LIPITOR) 10 MG tablet Take 1 tablet (10 mg total) by mouth at bedtime. 90 tablet 0  . metFORMIN (GLUCOPHAGE) 500 MG tablet Take 1 tablet (500 mg total) by mouth 2 (two) times daily with a meal. 180 tablet 0  . Multiple Vitamins-Minerals (EMERGEN-C IMMUNE PLUS) PACK Take 1 tablet by mouth 2 (two) times daily. 10 each 0  . nystatin-triamcinolone (MYCOLOG II) cream Apply 1 application topically 2 (two) times  daily. 30 g 1  . sertraline (ZOLOFT) 50 MG tablet Take 1.5 tablets (75 mg total) by mouth daily. 1 & 1/2 tab daily 135 tablet 3  . triamterene-hydrochlorothiazide (MAXZIDE-25) 37.5-25 MG tablet Take 1 tablet by mouth daily. 90 tablet 3  . Vitamin D, Ergocalciferol, (DRISDOL) 1.25 MG (50000 UNIT) CAPS capsule Take 1 capsule (50,000 Units total) by mouth every 7 (seven) days. 12 capsule 0   No current facility-administered medications for this visit.    Allergies-reviewed and updated No Known Allergies  Social History   Socioeconomic History  . Marital status: Married    Spouse name: Theo Reither  . Number of children: Not on file  . Years of education: Not on file  . Highest education level: Not on file  Occupational History  . Not on file  Tobacco Use  . Smoking status: Never Smoker  . Smokeless tobacco: Never Used  Vaping Use  . Vaping Use: Never used  Substance and Sexual Activity  . Alcohol use: No  . Drug use: No  . Sexual activity: Not Currently  Other Topics Concern  . Not on file  Social History Narrative  . Not on file   Social Determinants of Health   Financial Resource Strain:   . Difficulty of Paying Living Expenses: Not on file  Food Insecurity:   . Worried About Charity fundraiser in the Last Year: Not on file  . Ran Out of Food in the Last Year: Not on file  Transportation Needs:   . Lack of Transportation (Medical): Not on file  . Lack of Transportation (Non-Medical): Not on file  Physical Activity:   . Days of Exercise per Week: Not on file  . Minutes of Exercise per Session: Not on file  Stress:   . Feeling of Stress : Not on file  Social Connections:   . Frequency of Communication with Friends and Family: Not on file  . Frequency of Social Gatherings with Friends and Family: Not on file  . Attends Religious Services: Not on file  . Active Member of Clubs or Organizations: Not on file  . Attends Archivist Meetings: Not on file  .  Marital Status: Not on file          Objective:  Physical Exam: BP 110/65   Pulse 80   Temp 98.1 F (36.7 C) (Temporal)   Ht 5\' 6"  (1.676 m)   Wt 166 lb (75.3 kg)   SpO2 100%   BMI 26.79 kg/m   Gen: No acute distress, resting comfortably CV: Regular rate and rhythm with no murmurs appreciated Pulm: Normal work of breathing, clear to auscultation bilaterally with no crackles, wheezes, or rhonchi Neuro: Grossly normal, moves all extremities Psych: Normal affect and thought content      Akiel Fennell M. Jerline Pain, MD 10/11/2019 12:27 PM

## 2019-10-11 NOTE — Assessment & Plan Note (Signed)
Stable.  Continue Zoloft 75 mg daily.  Refill sent in today.

## 2019-11-04 ENCOUNTER — Other Ambulatory Visit: Payer: Self-pay

## 2019-11-04 ENCOUNTER — Ambulatory Visit (INDEPENDENT_AMBULATORY_CARE_PROVIDER_SITE_OTHER): Payer: 59 | Admitting: Physician Assistant

## 2019-11-04 ENCOUNTER — Encounter (INDEPENDENT_AMBULATORY_CARE_PROVIDER_SITE_OTHER): Payer: Self-pay | Admitting: Physician Assistant

## 2019-11-04 VITALS — BP 128/63 | HR 64 | Temp 98.0°F | Ht 66.0 in | Wt 163.0 lb

## 2019-11-04 DIAGNOSIS — Z9189 Other specified personal risk factors, not elsewhere classified: Secondary | ICD-10-CM | POA: Diagnosis not present

## 2019-11-04 DIAGNOSIS — R0602 Shortness of breath: Secondary | ICD-10-CM | POA: Diagnosis not present

## 2019-11-04 DIAGNOSIS — R7303 Prediabetes: Secondary | ICD-10-CM

## 2019-11-04 DIAGNOSIS — E669 Obesity, unspecified: Secondary | ICD-10-CM | POA: Diagnosis not present

## 2019-11-04 DIAGNOSIS — E559 Vitamin D deficiency, unspecified: Secondary | ICD-10-CM

## 2019-11-04 DIAGNOSIS — Z6830 Body mass index (BMI) 30.0-30.9, adult: Secondary | ICD-10-CM | POA: Diagnosis not present

## 2019-11-04 DIAGNOSIS — E7849 Other hyperlipidemia: Secondary | ICD-10-CM

## 2019-11-04 NOTE — Progress Notes (Signed)
Chief Complaint:   OBESITY Rebecca Rogers is here to discuss her progress with her obesity treatment plan along with follow-up of her obesity related diagnoses. Rebecca Rogers is on the Category 2 Plan and states she is following her eating plan approximately 75% of the time. [name] states she is not exercising at this time.  Today's visit was #: 21 Starting weight: 180 lbs Starting date: 08/15/2017 Today's weight: 163 lbs Today's date: 11/04/2019 Total lbs lost to date: 17 lbs Total lbs lost since last in-office visit: 1 lb  Interim History: Rebecca Rogers says she has done well with her weight maintenance.  After having COVID, she lost her sense of taste and smell and did some comfort eating.  She is back to eating on category.  For snack in the morning, she is eating a Special K bar.  She denies excessive hunger.  Subjective:   1. Vitamin D deficiency Rebecca Rogers's Vitamin D level was 53.6 on 07/31/2019. She is currently taking prescription vitamin D 50,000 IU each week. She denies nausea, vomiting or muscle weakness.  She says her energy is good.  2. Prediabetes Rebecca Rogers has a diagnosis of prediabetes based on her elevated HgA1c and was informed this puts her at greater risk of developing diabetes. She continues to work on diet and exercise to decrease her risk of diabetes. She denies nausea or hypoglycemia.  On metformin 500 mg twice daily.  She sometimes forgets to take her second metformin dose.  Lab Results  Component Value Date   HGBA1C 6.0 (H) 07/31/2019   Lab Results  Component Value Date   INSULIN 12.2 07/31/2019   INSULIN 10.7 04/29/2019   INSULIN 4.2 11/15/2018   INSULIN 6.1 07/30/2018   INSULIN 7.8 04/05/2018   3. Other hyperlipidemia Rebecca Rogers has hyperlipidemia and has been trying to improve her cholesterol levels with intensive lifestyle modification including a low saturated fat diet, exercise and weight loss. She denies any chest pain, claudication or myalgias.  Rebecca Rogers is taking Lipitor 10  mg daily.  This was started after her last labs.  She is tolerating it well.  Lab Results  Component Value Date   ALT 13 07/31/2019   AST 18 07/31/2019   ALKPHOS 105 07/31/2019   BILITOT 0.5 07/31/2019   Lab Results  Component Value Date   CHOL 244 (H) 07/31/2019   HDL 41 07/31/2019   LDLCALC 169 (H) 07/31/2019   TRIG 181 (H) 07/31/2019   CHOLHDL 6.0 (H) 06/22/2017   4. SOB (shortness of breath) With exacerbation.  No dizziness or lightheadedness.  5. At risk for diabetes mellitus Rebecca Rogers is at higher than average risk for developing diabetes due to her obesity.   Assessment/Plan:   1. Vitamin D deficiency Low Vitamin D level contributes to fatigue and are associated with obesity, breast, and colon cancer. She agrees to continue to take prescription Vitamin D @50 ,000 IU every week and will follow-up for routine testing of Vitamin D, at least 2-3 times per year to avoid over-replacement.  Will check vitamin D level today.  - VITAMIN D 25 Hydroxy (Vit-D Deficiency, Fractures)  2. Prediabetes Rebecca Rogers will continue to work on weight loss, exercise, and decreasing simple carbohydrates to help decrease the risk of diabetes.  Check labs today.  Continue metformin.  - Hemoglobin A1c - Insulin, random  3. Other hyperlipidemia Cardiovascular risk and specific lipid/LDL goals reviewed.  We discussed several lifestyle modifications today and [name] will continue to work on diet, exercise and weight loss efforts. Orders  and follow up as documented in patient record.  continue Lipitor.  Check labs today.  Counseling Intensive lifestyle modifications are the first line treatment for this issue. . Dietary changes: Increase soluble fiber. Decrease simple carbohydrates. . Exercise changes: Moderate to vigorous-intensity aerobic activity 150 minutes per week if tolerated. . Lipid-lowering medications: see documented in medical record.  - Lipid panel  4. SOB (shortness of breath) IC  performed today.  5. At risk for diabetes mellitus Rebecca Rogers was given approximately 15 minutes of diabetes education and counseling today. We discussed intensive lifestyle modifications today with an emphasis on weight loss as well as increasing exercise and decreasing simple carbohydrates in her diet. We also reviewed medication options with an emphasis on risk versus benefit of those discussed.   Repetitive spaced learning was employed today to elicit superior memory formation and behavioral change.  6. Class 1 obesity with serious comorbidity and body mass index (BMI) of 30.0 to 30.9 in adult, unspecified obesity type  - Comprehensive metabolic panel  Rebecca Rogers is currently in the action stage of change. As such, her goal is to continue with weight loss efforts. She has agreed to the Category 2 Plan.   Exercise goals: All adults should avoid inactivity. Some physical activity is better than none, and adults who participate in any amount of physical activity gain some health benefits.  Behavioral modification strategies: increasing lean protein intake and meal planning and cooking strategies.  Rebecca Rogers has agreed to follow-up with our clinic in 12 weeks. She was informed of the importance of frequent follow-up visits to maximize her success with intensive lifestyle modifications for her multiple health conditions.   Rebecca Rogers was informed we would discuss her lab results at her next visit unless there is a critical issue that needs to be addressed sooner. Rebecca Rogers agreed to keep her next visit at the agreed upon time to discuss these results.  Objective:   Blood pressure 128/63, pulse 64, temperature 98 F (36.7 C), height 5\' 6"  (1.676 m), weight 163 lb (73.9 kg), SpO2 99 %. Body mass index is 26.31 kg/m.  General: Cooperative, alert, well developed, in no acute distress. HEENT: Conjunctivae and lids unremarkable. Cardiovascular: Regular rhythm.  Lungs: Normal work of breathing. Neurologic: No  focal deficits.   Lab Results  Component Value Date   CREATININE 0.81 09/27/2019   BUN 16 09/27/2019   NA 141 09/27/2019   K 4.8 09/27/2019   CL 101 09/27/2019   CO2 27 09/27/2019   Lab Results  Component Value Date   ALT 13 07/31/2019   AST 18 07/31/2019   ALKPHOS 105 07/31/2019   BILITOT 0.5 07/31/2019   Lab Results  Component Value Date   HGBA1C 6.0 (H) 07/31/2019   HGBA1C 5.9 (H) 04/29/2019   HGBA1C 5.7 (H) 11/15/2018   HGBA1C 5.6 07/30/2018   HGBA1C 5.9 (H) 04/05/2018   Lab Results  Component Value Date   INSULIN 12.2 07/31/2019   INSULIN 10.7 04/29/2019   INSULIN 4.2 11/15/2018   INSULIN 6.1 07/30/2018   INSULIN 7.8 04/05/2018   Lab Results  Component Value Date   TSH 6.310 (H) 04/29/2019   Lab Results  Component Value Date   CHOL 244 (H) 07/31/2019   HDL 41 07/31/2019   LDLCALC 169 (H) 07/31/2019   TRIG 181 (H) 07/31/2019   CHOLHDL 6.0 (H) 06/22/2017   Lab Results  Component Value Date   WBC 7.2 09/27/2019   HGB 14.7 09/27/2019   HCT 43.1 09/27/2019   MCV  90 09/27/2019   PLT 477 (H) 09/27/2019   Attestation Statements:   Reviewed by clinician on day of visit: allergies, medications, problem list, medical history, surgical history, family history, social history, and previous encounter notes.  I, Water quality scientist, CMA, am acting as transcriptionist for Abby Potash, PA-C  I have reviewed the above documentation for accuracy and completeness, and I agree with the above. Abby Potash, PA-C

## 2019-11-05 LAB — COMPREHENSIVE METABOLIC PANEL
ALT: 13 IU/L (ref 0–32)
AST: 17 IU/L (ref 0–40)
Albumin/Globulin Ratio: 1.5 (ref 1.2–2.2)
Albumin: 4.5 g/dL (ref 3.8–4.8)
Alkaline Phosphatase: 110 IU/L (ref 44–121)
BUN/Creatinine Ratio: 16 (ref 12–28)
BUN: 12 mg/dL (ref 8–27)
Bilirubin Total: 0.3 mg/dL (ref 0.0–1.2)
CO2: 29 mmol/L (ref 20–29)
Calcium: 9.6 mg/dL (ref 8.7–10.3)
Chloride: 103 mmol/L (ref 96–106)
Creatinine, Ser: 0.76 mg/dL (ref 0.57–1.00)
GFR calc Af Amer: 97 mL/min/{1.73_m2} (ref >59)
GFR calc non Af Amer: 84 mL/min/{1.73_m2} (ref >59)
Globulin, Total: 3 g/dL (ref 1.5–4.5)
Glucose: 119 mg/dL — ABNORMAL HIGH (ref 65–99)
Potassium: 4.6 mmol/L (ref 3.5–5.2)
Sodium: 144 mmol/L (ref 134–144)
Total Protein: 7.5 g/dL (ref 6.0–8.5)

## 2019-11-05 LAB — INSULIN, RANDOM: INSULIN: 12 u[IU]/mL (ref 2.6–24.9)

## 2019-11-05 LAB — HEMOGLOBIN A1C
Est. average glucose Bld gHb Est-mCnc: 128 mg/dL
Hgb A1c MFr Bld: 6.1 % — ABNORMAL HIGH (ref 4.8–5.6)

## 2019-11-05 LAB — LIPID PANEL
Chol/HDL Ratio: 4.4 ratio (ref 0.0–4.4)
Cholesterol, Total: 176 mg/dL (ref 100–199)
HDL: 40 mg/dL (ref >39)
LDL Chol Calc (NIH): 119 mg/dL — ABNORMAL HIGH (ref 0–99)
Triglycerides: 92 mg/dL (ref 0–149)
VLDL Cholesterol Cal: 17 mg/dL (ref 5–40)

## 2019-11-05 LAB — VITAMIN D 25 HYDROXY (VIT D DEFICIENCY, FRACTURES): Vit D, 25-Hydroxy: 77.9 ng/mL (ref 30.0–100.0)

## 2019-11-14 ENCOUNTER — Other Ambulatory Visit: Payer: Self-pay | Admitting: Obstetrics and Gynecology

## 2019-11-14 ENCOUNTER — Other Ambulatory Visit: Payer: Self-pay

## 2019-11-14 ENCOUNTER — Encounter: Payer: Self-pay | Admitting: Obstetrics and Gynecology

## 2019-11-14 ENCOUNTER — Ambulatory Visit (INDEPENDENT_AMBULATORY_CARE_PROVIDER_SITE_OTHER): Payer: 59 | Admitting: Obstetrics and Gynecology

## 2019-11-14 VITALS — BP 128/74 | HR 74 | Resp 14 | Ht 67.0 in | Wt 167.0 lb

## 2019-11-14 DIAGNOSIS — Z01419 Encounter for gynecological examination (general) (routine) without abnormal findings: Secondary | ICD-10-CM

## 2019-11-14 DIAGNOSIS — F32A Depression, unspecified: Secondary | ICD-10-CM | POA: Insufficient documentation

## 2019-11-14 DIAGNOSIS — Z1231 Encounter for screening mammogram for malignant neoplasm of breast: Secondary | ICD-10-CM

## 2019-11-14 DIAGNOSIS — Z8041 Family history of malignant neoplasm of ovary: Secondary | ICD-10-CM | POA: Diagnosis not present

## 2019-11-14 NOTE — Progress Notes (Signed)
64 y.o. G0P0000 Married White or Caucasian Not Hispanic or Latino female here for annual exam.  H/O Laparoscopic [facility]. 3 years later she had a BSO after her mom was diagnosed with ovarian cancer (13 years ago). Mild, tolerable vasomotor symptoms. Not sexually active secondary to ED (husband with h/o prostate cancer).  Mother had ovarian cancer, caught early and treated with surgery,no chemo. Mom is 40.   No vaginal bleeding. No bowel or bladder c/o   She has lost 22 lbs and is followed at healthy weight and wellness.     No LMP recorded. Patient has had a hysterectomy.          Sexually active: No.  The current method of family planning is status post hysterectomy.    Exercising: No.   Smoker:  no  Health Maintenance: Pap:  07/17/18  Negative  History of abnormal Pap:  no MMG:  08/31/18-  Density Cat B - Birads 2 Benign  BMD:   02/03/09  Normal  Colonoscopy: 12/21/18  Neg, prior colonoscopy with polyps. Needs f/u in 12/23.   TDaP:  Up to date  Gardasil: NA   reports that she has never smoked. She has never used smokeless tobacco. She reports that she does not drink alcohol and does not use drugs. Married x 2.5 years. 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 Plans to retire.  Past Medical History:  Diagnosis Date   Adenomyosis    Anxiety    Cyst of right breast    4-5 oclock 1/2 cm   HLD (hyperlipidemia)    Hypertension    bordeline   Hypothyroidism    Increased BMI     Past Surgical History:  Procedure Laterality Date   ABDOMINAL HYSTERECTOMY     lsh- adenomysis and fibroids   BREAST BIOPSY Right 2016   NEG   COLONOSCOPY WITH PROPOFOL N/A 06/05/2015   Procedure: COLONOSCOPY WITH PROPOFOL;  Surgeon: Lucilla Lame, MD;  Location: [place];  Service: Endoscopy;  Laterality: N/A;   COLONOSCOPY WITH PROPOFOL N/A 12/04/2015   Procedure: COLONOSCOPY WITH PROPOFOL;  Surgeon: Lucilla Lame, MD;  Location: [place];  Service: Endoscopy;  Laterality: N/A;    COLONOSCOPY WITH PROPOFOL N/A 12/21/2018   Procedure: COLONOSCOPY WITH PROPOFOL;  Surgeon: Lucilla Lame, MD;  Location: [facility] ENDOSCOPY;  Service: Endoscopy;  Laterality: N/A;   LAPAROSCOPIC SALPINGOOPHERECTOMY     prophylactic   POLYPECTOMY  06/05/2015   Procedure: POLYPECTOMY;  Surgeon: Lucilla Lame, MD;  Location: [place];  Service: Endoscopy;;   POLYPECTOMY  12/04/2015   Procedure: POLYPECTOMY;  Surgeon: Lucilla Lame, MD;  Location: [place];  Service: Endoscopy;;   TONSILLECTOMY  1965    Current Outpatient Medications  Medication Sig Dispense Refill   atorvastatin (LIPITOR) 10 MG tablet Take 1 tablet (10 mg total) by mouth at bedtime. 90 tablet 0   metFORMIN (GLUCOPHAGE) 500 MG tablet Take 1 tablet (500 mg total) by mouth 2 (two) times daily with a meal. 180 tablet 0   Multiple Vitamins-Minerals (EMERGEN-C IMMUNE PLUS) PACK Take 1 tablet by mouth 2 (two) times daily. 10 each 0   nystatin-triamcinolone (MYCOLOG II) cream Apply 1 application topically 2 (two) times daily. 30 g 1   sertraline (ZOLOFT) 50 MG tablet Take 1.5 tablets (75 mg total) by mouth daily. 1 & 1/2 tab daily 135 tablet 3   triamterene-hydrochlorothiazide (MAXZIDE-25) 37.5-25 MG tablet Take 1 tablet by mouth daily. 90 tablet 3   Vitamin D, Ergocalciferol, (DRISDOL)  1.25 MG (50000 UNIT) CAPS capsule Take 1 capsule (50,000 Units total) by mouth every 7 (seven) days. 12 capsule 0   No current facility-administered medications for this visit.    Family History  Problem Relation Age of Onset   Ovarian cancer Mother    Prostate cancer Father    Breast cancer Neg Hx    Colon cancer Neg Hx    Diabetes Neg Hx    Heart disease Neg Hx     Review of Systems  All other systems reviewed and are negative.   Exam:   BP 128/74    Pulse 74    Resp 14    Ht 5\' 7"  (1.702 m)    Wt 167 lb (75.8 kg)    SpO2 98%    BMI 26.16 kg/m   Weight change: @WEIGHTCHANGE @ Height:   Height: 5\' 7"  (170.2 cm)   Ht Readings from Last 3 Encounters:  11/14/19 5\' 7"  (1.702 m)  11/04/19 5\' 6"  (1.676 m)  10/11/19 5\' 6"  (1.676 m)    General appearance: alert, cooperative and appears stated age Head: Normocephalic, without obvious abnormality, atraumatic Neck: no adenopathy, supple, symmetrical, trachea midline and thyroid normal to inspection and palpation Lungs: clear to auscultation bilaterally Cardiovascular: regular rate and rhythm Breasts: normal appearance, no masses or tenderness Abdomen: soft, non-tender; non distended,  no masses,  no organomegaly Extremities: extremities normal, atraumatic, no cyanosis or edema Skin: Skin color, texture, turgor normal. No rashes or lesions Lymph nodes: Cervical, supraclavicular, and axillary nodes normal. No abnormal inguinal nodes palpated Neurologic: Grossly normal   Pelvic: External genitalia:  no lesions              Urethra:  normal appearing urethra with no masses, tenderness or lesions              Bartholins and Skenes: normal                 Vagina: normal appearing vagina with normal color and discharge, no lesions              Cervix: no lesions               Bimanual Exam:  Uterus:  uterus absent              Adnexa: no mass, fullness, tenderness               Rectovaginal: Confirms               Anus:  normal sphincter tone, no lesions  Gae Dry chaperoned for the exam.  A:  Well Woman with normal exam  H/O [facility]  H/O BSO  FH of ovarian cancer (Mom, in her 31's)  P:   No pap this year  Mammogram due, she will schedule  Colonoscopy UTD  Discussed breast self exam  Discussed calcium and vit D intake  Labs UTD

## 2019-11-14 NOTE — Patient Instructions (Signed)

## 2019-12-02 ENCOUNTER — Ambulatory Visit: Payer: 59 | Admitting: Family Medicine

## 2019-12-10 ENCOUNTER — Other Ambulatory Visit: Payer: Self-pay | Admitting: Physician Assistant

## 2019-12-10 ENCOUNTER — Encounter (INDEPENDENT_AMBULATORY_CARE_PROVIDER_SITE_OTHER): Payer: Self-pay | Admitting: Physician Assistant

## 2019-12-10 ENCOUNTER — Other Ambulatory Visit (INDEPENDENT_AMBULATORY_CARE_PROVIDER_SITE_OTHER): Payer: Self-pay | Admitting: Physician Assistant

## 2019-12-10 DIAGNOSIS — E1169 Type 2 diabetes mellitus with other specified complication: Secondary | ICD-10-CM

## 2019-12-10 DIAGNOSIS — E785 Hyperlipidemia, unspecified: Secondary | ICD-10-CM

## 2019-12-10 DIAGNOSIS — E8881 Metabolic syndrome: Secondary | ICD-10-CM

## 2019-12-10 MED ORDER — ATORVASTATIN CALCIUM 10 MG PO TABS: 10.0000 mg | ORAL_TABLET | Freq: Every day | ORAL | 0 refills | Status: DC

## 2019-12-10 MED ORDER — METFORMIN HCL 500 MG PO TABS: 500.0000 mg | ORAL_TABLET | Freq: Two times a day (BID) | ORAL | 0 refills | Status: DC

## 2019-12-10 NOTE — Telephone Encounter (Signed)
Please Review

## 2019-12-10 NOTE — Telephone Encounter (Signed)
This is done. Thanks.

## 2019-12-11 ENCOUNTER — Other Ambulatory Visit: Payer: Self-pay

## 2019-12-11 ENCOUNTER — Ambulatory Visit
Admission: RE | Admit: 2019-12-11 | Discharge: 2019-12-11 | Disposition: A | Discharge status: Home / Self Care | Payer: 59 | Source: Ambulatory Visit | Attending: Obstetrics and Gynecology | Admitting: Obstetrics and Gynecology

## 2019-12-11 DIAGNOSIS — Z1231 Encounter for screening mammogram for malignant neoplasm of breast: Secondary | ICD-10-CM | POA: Diagnosis not present

## 2019-12-16 ENCOUNTER — Other Ambulatory Visit: Payer: Self-pay | Admitting: Obstetrics and Gynecology

## 2019-12-16 DIAGNOSIS — R928 Other abnormal and inconclusive findings on diagnostic imaging of breast: Secondary | ICD-10-CM

## 2019-12-16 DIAGNOSIS — N6310 Unspecified lump in the right breast, unspecified quadrant: Secondary | ICD-10-CM

## 2019-12-18 ENCOUNTER — Ambulatory Visit
Admission: RE | Admit: 2019-12-18 | Discharge: 2019-12-18 | Disposition: A | Discharge status: Home / Self Care | Payer: 59 | Source: Ambulatory Visit | Attending: Obstetrics and Gynecology | Admitting: Obstetrics and Gynecology

## 2019-12-18 ENCOUNTER — Other Ambulatory Visit: Payer: Self-pay | Admitting: Obstetrics and Gynecology

## 2019-12-18 ENCOUNTER — Other Ambulatory Visit: Payer: Self-pay

## 2019-12-18 DIAGNOSIS — N6310 Unspecified lump in the right breast, unspecified quadrant: Secondary | ICD-10-CM

## 2019-12-18 DIAGNOSIS — R928 Other abnormal and inconclusive findings on diagnostic imaging of breast: Secondary | ICD-10-CM

## 2019-12-18 DIAGNOSIS — N6313 Unspecified lump in the right breast, lower outer quadrant: Secondary | ICD-10-CM | POA: Diagnosis not present

## 2019-12-24 ENCOUNTER — Ambulatory Visit
Admission: RE | Admit: 2019-12-24 | Discharge: 2019-12-24 | Disposition: A | Discharge status: Home / Self Care | Payer: 59 | Source: Ambulatory Visit | Attending: Obstetrics and Gynecology | Admitting: Obstetrics and Gynecology

## 2019-12-24 ENCOUNTER — Other Ambulatory Visit: Payer: Self-pay

## 2019-12-24 DIAGNOSIS — N6001 Solitary cyst of right breast: Secondary | ICD-10-CM | POA: Diagnosis not present

## 2019-12-24 DIAGNOSIS — N6310 Unspecified lump in the right breast, unspecified quadrant: Secondary | ICD-10-CM

## 2019-12-24 DIAGNOSIS — N6313 Unspecified lump in the right breast, lower outer quadrant: Secondary | ICD-10-CM | POA: Diagnosis not present

## 2019-12-24 DIAGNOSIS — R928 Other abnormal and inconclusive findings on diagnostic imaging of breast: Secondary | ICD-10-CM | POA: Diagnosis not present

## 2019-12-24 HISTORY — PX: BREAST CYST ASPIRATION: SHX578

## 2020-06-03 ENCOUNTER — Other Ambulatory Visit: Payer: Self-pay

## 2020-06-03 MED FILL — Triamterene & Hydrochlorothiazide Tab 37.5-25 MG: ORAL | 90 days supply | Qty: 90 | Fill #0 | Status: AC

## 2020-06-08 ENCOUNTER — Other Ambulatory Visit: Payer: Self-pay

## 2020-06-10 ENCOUNTER — Other Ambulatory Visit: Payer: Self-pay

## 2020-06-25 ENCOUNTER — Other Ambulatory Visit: Payer: Self-pay

## 2020-06-25 MED FILL — Sertraline HCl Tab 50 MG: ORAL | 90 days supply | Qty: 135 | Fill #0 | Status: AC

## 2020-08-17 ENCOUNTER — Other Ambulatory Visit: Payer: Self-pay

## 2020-08-17 MED FILL — Triamterene & Hydrochlorothiazide Tab 37.5-25 MG: ORAL | 90 days supply | Qty: 90 | Fill #1 | Status: AC

## 2020-10-12 ENCOUNTER — Ambulatory Visit: Payer: Self-pay | Admitting: Obstetrics and Gynecology

## 2020-10-12 ENCOUNTER — Other Ambulatory Visit: Payer: Self-pay

## 2020-10-12 ENCOUNTER — Encounter: Payer: Self-pay | Admitting: Family Medicine

## 2020-10-12 ENCOUNTER — Ambulatory Visit (INDEPENDENT_AMBULATORY_CARE_PROVIDER_SITE_OTHER): Admitting: Family Medicine

## 2020-10-12 VITALS — BP 133/80 | HR 64 | Temp 97.2°F | Ht 67.0 in | Wt 163.0 lb

## 2020-10-12 DIAGNOSIS — Z6825 Body mass index (BMI) 25.0-25.9, adult: Secondary | ICD-10-CM

## 2020-10-12 DIAGNOSIS — E785 Hyperlipidemia, unspecified: Secondary | ICD-10-CM

## 2020-10-12 DIAGNOSIS — R739 Hyperglycemia, unspecified: Secondary | ICD-10-CM | POA: Diagnosis not present

## 2020-10-12 DIAGNOSIS — E559 Vitamin D deficiency, unspecified: Secondary | ICD-10-CM | POA: Diagnosis not present

## 2020-10-12 DIAGNOSIS — F419 Anxiety disorder, unspecified: Secondary | ICD-10-CM

## 2020-10-12 DIAGNOSIS — I10 Essential (primary) hypertension: Secondary | ICD-10-CM | POA: Diagnosis not present

## 2020-10-12 DIAGNOSIS — E038 Other specified hypothyroidism: Secondary | ICD-10-CM | POA: Diagnosis not present

## 2020-10-12 DIAGNOSIS — Z0001 Encounter for general adult medical examination with abnormal findings: Secondary | ICD-10-CM | POA: Diagnosis not present

## 2020-10-12 DIAGNOSIS — Z23 Encounter for immunization: Secondary | ICD-10-CM | POA: Diagnosis not present

## 2020-10-12 DIAGNOSIS — F32A Depression, unspecified: Secondary | ICD-10-CM

## 2020-10-12 LAB — COMPREHENSIVE METABOLIC PANEL
ALT: 10 U/L (ref 0–35)
AST: 15 U/L (ref 0–37)
Albumin: 4.2 g/dL (ref 3.5–5.2)
Alkaline Phosphatase: 88 U/L (ref 39–117)
BUN: 14 mg/dL (ref 6–23)
CO2: 31 mEq/L (ref 19–32)
Calcium: 9.8 mg/dL (ref 8.4–10.5)
Chloride: 101 mEq/L (ref 96–112)
Creatinine, Ser: 0.84 mg/dL (ref 0.40–1.20)
GFR: 74.1 mL/min (ref >60.00)
Glucose, Bld: 123 mg/dL — ABNORMAL HIGH (ref 70–99)
Potassium: 4.2 mEq/L (ref 3.5–5.1)
Sodium: 139 mEq/L (ref 135–145)
Total Bilirubin: 0.5 mg/dL (ref 0.2–1.2)
Total Protein: 7.5 g/dL (ref 6.0–8.3)

## 2020-10-12 LAB — LIPID PANEL
Cholesterol: 231 mg/dL — ABNORMAL HIGH (ref 0–200)
HDL: 36.5 mg/dL — ABNORMAL LOW (ref >39.00)
LDL Cholesterol: 163 mg/dL — ABNORMAL HIGH (ref 0–99)
NonHDL: 194.49
Total CHOL/HDL Ratio: 6
Triglycerides: 159 mg/dL — ABNORMAL HIGH (ref 0.0–149.0)
VLDL: 31.8 mg/dL (ref 0.0–40.0)

## 2020-10-12 LAB — CBC
HCT: 40.2 % (ref 36.0–46.0)
Hemoglobin: 13.6 g/dL (ref 12.0–15.0)
MCHC: 33.7 g/dL (ref 30.0–36.0)
MCV: 90.4 fl (ref 78.0–100.0)
Platelets: 366 10*3/uL (ref 150.0–400.0)
RBC: 4.45 Mil/uL (ref 3.87–5.11)
RDW: 14.3 % (ref 11.5–15.5)
WBC: 5.5 10*3/uL (ref 4.0–10.5)

## 2020-10-12 LAB — TSH: TSH: 4.6 u[IU]/mL (ref 0.35–5.50)

## 2020-10-12 LAB — VITAMIN D 25 HYDROXY (VIT D DEFICIENCY, FRACTURES): VITD: 32.67 ng/mL (ref 30.00–100.00)

## 2020-10-12 LAB — HEMOGLOBIN A1C: Hgb A1c MFr Bld: 6.3 % (ref 4.6–6.5)

## 2020-10-12 MED ORDER — SERTRALINE HCL 50 MG PO TABS: 75.0000 mg | ORAL_TABLET | Freq: Every day | ORAL | 3 refills | Status: DC

## 2020-10-12 MED ORDER — TRIAMTERENE-HCTZ 37.5-25 MG PO TABS: 1.0000 | ORAL_TABLET | Freq: Every day | ORAL | 3 refills | Status: DC

## 2020-10-12 NOTE — Progress Notes (Signed)
Chief Complaint:  Rebecca Rogers is a 65 y.o. female who presents today for her annual comprehensive physical exam.    Assessment/Plan:  Chronic Problems Addressed Today: Dyslipidemia  Check lipids.  Previously on Lipitor 10 mg daily however is not been taking for the last couple of months.  Hypertension At goal on Maxide 37.5-25 once daily.  Check labs.  Discussed lifestyle modifications.  Anxiety Stable on Zoloft 75 mg daily.  Refill sent in today.  Depression Stable on Zoloft 75 mg daily.  Vitamin D deficiency Check vitamin D.  Hyperglycemia Check A1c.  Has been on metformin in the past with has not been taking for several months.  Hypothyroidism Check TSH.  Body mass index is 25.53 kg/m. / Overweight  BMI Metric Follow Up - 10/12/20 0959       BMI Metric Follow Up-Please document annually   BMI Metric Follow Up Education provided             Preventative Healthcare: Flu shot given today. Check labs.   Patient Counseling(The following topics were reviewed and/or handout was given):  -Nutrition: Stressed importance of moderation in sodium/caffeine intake, saturated fat and cholesterol, caloric balance, sufficient intake of fresh fruits, vegetables, and fiber.  -Stressed the importance of regular exercise.   -Substance Abuse: Discussed cessation/primary prevention of tobacco, alcohol, or other drug use; driving or other dangerous activities under the influence; availability of treatment for abuse.   -Injury prevention: Discussed safety belts, safety helmets, smoke detector, smoking near bedding or upholstery.   -Sexuality: Discussed sexually transmitted diseases, partner selection, use of condoms, avoidance of unintended pregnancy and contraceptive alternatives.   -Dental health: Discussed importance of regular tooth brushing, flossing, and dental visits.  -Health maintenance and immunizations reviewed. Please refer to Health maintenance section.  Return to  care in 1 year for next preventative visit.     Subjective:  HPI:  She has no acute complaints today.   Lifestyle Diet: Balanced.  Exercise: Very busy with work.   Depression screen PHQ 2/9 10/12/2020  Decreased Interest 0  Down, Depressed, Hopeless 0  PHQ - 2 Score 0  Altered sleeping -  Tired, decreased energy -  Change in appetite -  Feeling bad or failure about yourself  -  Trouble concentrating -  Moving slowly or fidgety/restless -  Suicidal thoughts -  PHQ-9 Score -  Difficult doing work/chores -    Health Maintenance Due  Topic Date Due   HIV Screening  Never done   Hepatitis C Screening  Never done   COVID-19 Vaccine (3 - Booster for Pfizer series) 07/14/2019   INFLUENZA VACCINE  08/03/2020     ROS: Per HPI, otherwise a complete review of systems was negative.   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   Hypothyroidism 04/14/2016   Hx of colonic polyps    Benign neoplasm of transverse colon    Personal history of colonic polyps  Benign neoplasm of ascending colon    Benign neoplasm of cecum    Benign neoplasm of sigmoid colon    Anxiety 04/07/2015   Status post bilateral salpingo-oophorectomy (BSO) 04/07/2015   Status post laparoscopic supracervical hysterectomy 04/07/2015   Family history of ovarian cancer 04/07/2015   Hypertension 04/07/2015   Past Surgical History:  Procedure Laterality Date   ABDOMINAL HYSTERECTOMY     lsh- adenomysis and fibroids   BREAST BIOPSY Right 2016   NEG   COLONOSCOPY WITH  PROPOFOL N/A 06/05/2015   Procedure: COLONOSCOPY WITH PROPOFOL;  Surgeon: Lucilla Lame, MD;  Location: [place];  Service: Endoscopy;  Laterality: N/A;   COLONOSCOPY WITH PROPOFOL N/A 12/04/2015   Procedure: COLONOSCOPY WITH PROPOFOL;  Surgeon: Lucilla Lame, MD;  Location: [place];  Service: Endoscopy;  Laterality: N/A;   COLONOSCOPY WITH PROPOFOL N/A 12/21/2018   Procedure: COLONOSCOPY WITH PROPOFOL;  Surgeon: Lucilla Lame, MD;  Location: [facility] ENDOSCOPY;  Service: Endoscopy;  Laterality: N/A;   LAPAROSCOPIC SALPINGOOPHERECTOMY     prophylactic   POLYPECTOMY  06/05/2015   Procedure: POLYPECTOMY;  Surgeon: Lucilla Lame, MD;  Location: [place];  Service: Endoscopy;;   POLYPECTOMY  12/04/2015   Procedure: POLYPECTOMY;  Surgeon: Lucilla Lame, MD;  Location: [place];  Service: Endoscopy;;   TONSILLECTOMY  1965    Family History  Problem Relation Age of Onset   Ovarian cancer Mother    Prostate cancer Father    Breast cancer Neg Hx    Colon cancer Neg Hx    Diabetes Neg Hx    Heart disease Neg Hx     Medications- reviewed and updated Current Outpatient Medications  Medication Sig Dispense Refill   Multiple Vitamins-Minerals (EMERGEN-C IMMUNE PLUS) PACK Take 1 tablet by mouth 2 (two) times daily. 10 each 0   nystatin-triamcinolone (MYCOLOG II) cream Apply 1 application topically 2 (two) times daily. 30 g 1   Vitamin D, Ergocalciferol, (DRISDOL) 1.25 MG (50000 UNIT) CAPS capsule Take 1 capsule (50,000 Units total) by mouth every 7 (seven) days. 12 capsule 0   sertraline (ZOLOFT) 50 MG tablet Take 1.5 tablets (75 mg total) by mouth daily. 1 & 1/2 tab daily 135 tablet 3   triamterene-hydrochlorothiazide (MAXZIDE-25) 37.5-25 MG tablet TAKE 1 TABLET BY MOUTH DAILY. 90 tablet 3   No current facility-administered medications for this visit.    Allergies-reviewed and updated No Known Allergies  Social History   Socioeconomic History   Marital status:  Married    Spouse name: Charnelle Bergeman   Number of children: Not on file   Years of education: Not on file   Highest education level: Not on file  Occupational History   Not on file  Tobacco Use   Smoking status: Never   Smokeless tobacco: Never  Vaping Use   Vaping Use: Never used  Substance and Sexual Activity   Alcohol use: No   Drug use: No   Sexual activity: Not Currently  Other Topics Concern   Not on file  Social History Narrative   Not on file   Social Determinants of Health   Financial Resource Strain: Not on file  Food Insecurity: Not on file  Transportation Needs: Not on file  Physical Activity: Not on file  Stress: Not on file  Social Connections: Not on file        Objective:  Physical Exam: BP 133/80   Pulse 64   Temp (!) 97.2 F (36.2 C) (Temporal)   Ht  5\' 7"  (1.702 m)   Wt 163 lb (73.9 kg)   SpO2 99%   BMI 25.53 kg/m   Body mass index is 25.53 kg/m. Wt Readings from Last 3 Encounters:  10/12/20 163 lb (73.9 kg)  11/14/19 167 lb (75.8 kg)  11/04/19 163 lb (73.9 kg)   Gen: NAD, resting comfortably HEENT: TMs normal bilaterally. OP clear. No thyromegaly noted.  CV: RRR with no murmurs appreciated Pulm: NWOB, CTAB with no crackles, wheezes, or rhonchi GI: Normal bowel sounds present. Soft, Nontender, Nondistended. MSK: no edema, cyanosis, or clubbing noted Skin: warm, dry Neuro: CN2-12 grossly intact. Strength 5/5 in upper and lower extremities. Reflexes symmetric and intact bilaterally.  Psych: Normal affect and thought content     Shantaya Bluestone M. Jerline Pain, MD 10/12/2020 10:00 AM

## 2020-10-12 NOTE — Assessment & Plan Note (Signed)
At goal on Maxide 37.5-25 once daily.  Check labs.  Discussed lifestyle modifications.

## 2020-10-12 NOTE — Assessment & Plan Note (Signed)
Check vitamin D. 

## 2020-10-12 NOTE — Assessment & Plan Note (Signed)
Check A1c.  Has been on metformin in the past with has not been taking for several months.

## 2020-10-12 NOTE — Patient Instructions (Signed)
It was very nice to see you today!  Keep up the good work!  We will check blood work today.  We will give you a flu shot today.  I will see back in year.  Come back to see me sooner if needed.  Take care, Dr Jerline Pain  PLEASE NOTE:  If you had any lab tests please let us know if you have not heard back within a few days. You may see your results on mychart before we have a chance to review them but we will give you a call once they are reviewed by [name]. If we ordered any referrals today, please let us know if you have not heard from their office within the next week.   Please try these tips to maintain a healthy lifestyle:  Eat at least 3 REAL meals and 1-2 snacks per day.  Aim for no more than 5 hours between eating.  If you eat breakfast, please do so within one hour of getting up.   Each meal should contain half fruits/vegetables, one quarter protein, and one quarter carbs (no bigger than a computer mouse)  Cut down on sweet beverages. This includes juice, soda, and sweet tea.   Drink at least 1 glass of water with each meal and aim for at least 8 glasses per day  Exercise at least 150 minutes every week.    Preventive Care 72-24 Years Old, Female Preventive care refers to lifestyle choices and visits with your health care provider that can promote health and wellness. This includes: A yearly physical exam. This is also called an annual wellness visit. Regular dental and eye exams. Immunizations. Screening for certain conditions. Healthy lifestyle choices, such as: Eating a healthy diet. Getting regular exercise. Not using drugs or products that contain nicotine and tobacco. Limiting alcohol use. What can I expect for my preventive care visit? Physical exam Your health care provider will check your: Height and weight. These may be used to calculate your BMI (body mass index). BMI is a measurement that tells if you are at a healthy weight. Heart rate and blood pressure. Body  temperature. Skin for abnormal spots. Counseling Your health care provider may ask you questions about your: Past medical problems. Family's medical history. Alcohol, tobacco, and drug use. Emotional well-being. Home life and relationship well-being. Sexual activity. Diet, exercise, and sleep habits. Work and work Statistician. Access to firearms. Method of birth control. Menstrual cycle. Pregnancy history. What immunizations do I need? Vaccines are usually given at various ages, according to a schedule. Your health care provider will recommend vaccines for you based on your age, medical history, and lifestyle or other factors, such as travel or where you work. What tests do I need? Blood tests Lipid and cholesterol levels. These may be checked every 5 years, or more often if you are over 34 years old. Hepatitis C test. Hepatitis B test. Screening Lung cancer screening. You may have this screening every year starting at age 29 if you have a 30-pack-year history of smoking and currently smoke or have quit within the past 15 years. Colorectal cancer screening. All adults should have this screening starting at age 38 and continuing until age 67. Your health care provider may recommend screening at age 42 if you are at increased risk. You will have tests every 1-10 years, depending on your results and the type of screening test. Diabetes screening. This is done by checking your blood sugar (glucose) after you have not eaten for a  while (fasting). You may have this done every 1-3 years. Mammogram. This may be done every 1-2 years. Talk with your health care provider about when you should start having regular mammograms. This may depend on whether you have a family history of breast cancer. BRCA-related cancer screening. This may be done if you have a family history of breast, ovarian, tubal, or peritoneal cancers. Pelvic exam and Pap test. This may be done every 3 years starting at age  50. Starting at age 58, this may be done every 5 years if you have a Pap test in combination with an HPV test. Other tests STD (sexually transmitted disease) testing, if you are at risk. Bone density scan. This is done to screen for osteoporosis. You may have this scan if you are at high risk for osteoporosis. Talk with your health care provider about your test results, treatment options, and if necessary, the need for more tests. Follow these instructions at home: Eating and drinking  Eat a diet that includes fresh fruits and vegetables, whole grains, lean protein, and low-fat dairy products. Take vitamin and mineral supplements as recommended by your health care provider. Do not drink alcohol if: Your health care provider tells you not to drink. You are pregnant, may be pregnant, or are planning to become pregnant. If you drink alcohol: Limit how much you have to 0-1 drink a day. Be aware of how much alcohol is in your drink. In the [place] one drink equals one 12 oz bottle of beer (355 mL), one 5 oz glass of wine (148 mL), or one 1 oz glass of hard liquor (44 mL). Lifestyle Take daily care of your teeth and gums. Brush your teeth every morning and night with fluoride toothpaste. Floss one time each day. Stay active. Exercise for at least 30 minutes 5 or more days each week. Do not use any products that contain nicotine or tobacco, such as cigarettes, e-cigarettes, and chewing tobacco. If you need help quitting, ask your health care provider. Do not use drugs. If you are sexually active, practice safe sex. Use a condom or other form of protection to prevent STIs (sexually transmitted infections). If you do not wish to become pregnant, use a form of birth control. If you plan to become pregnant, see your health care provider for a prepregnancy visit. If told by your health care provider, take low-dose aspirin daily starting at age 37. Find healthy ways to cope with stress, such  as: Meditation, yoga, or listening to music. Journaling. Talking to a trusted person. Spending time with friends and family. Safety Always wear your seat belt while driving or riding in a vehicle. Do not drive: If you have been drinking alcohol. Do not ride with someone who has been drinking. When you are tired or distracted. While texting. Wear a helmet and other protective equipment during sports activities. If you have firearms in your house, make sure you follow all gun safety procedures. What's next? Visit your health care provider once a year for an annual wellness visit. Ask your health care provider how often you should have your eyes and teeth checked. Stay up to date on all vaccines. This information is not intended to replace advice given to you by your health care provider. Make sure you discuss any questions you have with your health care provider. Document Revised: 02/28/2020 Document Reviewed: 08/31/2017 Elsevier Patient Education  2022 [employer].

## 2020-10-12 NOTE — Assessment & Plan Note (Signed)
Check TSH 

## 2020-10-12 NOTE — Assessment & Plan Note (Signed)
Stable on Zoloft 75 mg daily.  Refill sent in today.

## 2020-10-12 NOTE — Assessment & Plan Note (Addendum)
  Check lipids.  Previously on Lipitor 10 mg daily however is not been taking for the last couple of months.

## 2020-10-12 NOTE — Assessment & Plan Note (Addendum)
Stable on Zoloft 75 mg daily.

## 2020-10-14 ENCOUNTER — Other Ambulatory Visit: Payer: Self-pay | Admitting: *Deleted

## 2020-10-14 DIAGNOSIS — E1169 Type 2 diabetes mellitus with other specified complication: Secondary | ICD-10-CM

## 2020-10-14 DIAGNOSIS — E785 Hyperlipidemia, unspecified: Secondary | ICD-10-CM

## 2020-10-14 DIAGNOSIS — R7303 Prediabetes: Secondary | ICD-10-CM

## 2020-10-14 MED ORDER — ATORVASTATIN CALCIUM 10 MG PO TABS: 10.0000 mg | ORAL_TABLET | Freq: Every day | ORAL | 1 refills | Status: DC

## 2020-10-14 MED ORDER — METFORMIN HCL 500 MG PO TABS: 500.0000 mg | ORAL_TABLET | Freq: Every day | ORAL | 1 refills | Status: DC

## 2020-10-14 NOTE — Progress Notes (Signed)
Please inform patient of the following:  Her cholesterol and blood sugar are elevated. Recommend she start back on lipitor 10mg  daily and metformin 500mg  daily. Everything else is NORMAL.  We can recheck in 6-12 months.

## 2020-11-11 NOTE — Progress Notes (Signed)
65 y.o. G0P0000 Married White or Caucasian Not Hispanic or Latino female here for annual exam.  H/O Laparoscopic [facility]. 3 years later she had a BSO after her mom was diagnosed with ovarian cancer (13 years ago). Not sexually active second to ED (he had prostate cancer).   No vaginal bleeding.  Occasional, mild GSI, tolerable. No bowel c/o.    No LMP recorded. Patient has had a hysterectomy.          Sexually active: No.  The current method of family planning is status post hysterectomy.    Exercising: No.  The patient does not participate in regular exercise at present. Smoker:  no  Health Maintenance: Pap:  07/17/18- WNL,  12/06/17- LGSIL, HPV+, 11/08/16- ASCUS, HPV+ History of abnormal Pap:  yes; 04/07/16-WNL, HPV+; 05/04/16- ECC benign Bx CIN 1, 11/08/16- ASCUS, HPV+; Cx Bx- CIN 1, ECC- benign, 12/06/17- LGSIL, HPV+  MMG:  12/11/19- birads 0; 12/18/19- Diag- birads 4, [name]- birads 4, 12/24/19- needle aspiration-benign cyst  BMD:   02/03/09- WNL Colonoscopy: 12/21/18- f/u in 5 years  TDaP:  up to date  Gardasil: n/a   reports that she has never smoked. She has never used smokeless tobacco. She reports that she does not drink alcohol and does not use drugs. She retired in 12/21, loving it. Works part time at [employer] but [employer].   Past Medical History:  Diagnosis Date   Adenomyosis    Anxiety    Cyst of right breast    4-5 oclock 1/2 cm   HLD (hyperlipidemia)    Hypertension    bordeline   Hypothyroidism    Increased BMI     Past Surgical History:  Procedure Laterality Date   ABDOMINAL HYSTERECTOMY     lsh- adenomysis and fibroids   BREAST BIOPSY Right 2016   NEG   COLONOSCOPY WITH PROPOFOL N/A 06/05/2015   Procedure: COLONOSCOPY WITH PROPOFOL;  Surgeon: Lucilla Lame, MD;  Location: [place];  Service: Endoscopy;  Laterality: N/A;   COLONOSCOPY WITH PROPOFOL N/A 12/04/2015   Procedure: COLONOSCOPY WITH PROPOFOL;  Surgeon: Lucilla Lame, MD;  Location: [place];   Service: Endoscopy;  Laterality: N/A;   COLONOSCOPY WITH PROPOFOL N/A 12/21/2018   Procedure: COLONOSCOPY WITH PROPOFOL;  Surgeon: Lucilla Lame, MD;  Location: [facility] END[facility]COPY;  Service: Endoscopy;  Laterality: N/A;   LAPAROSCOPIC SALPINGOOPHERECTOMY     prophylactic   POLYPECTOMY  06/05/2015   Procedure: POLYPECTOMY;  Surgeon: Lucilla Lame, MD;  Location: [place];  Service: Endoscopy;;   POLYPECTOMY  12/04/2015   Procedure: POLYPECTOMY;  Surgeon: Lucilla Lame, MD;  Location: [place];  Service: Endoscopy;;   TONSILLECTOMY  1965    Current Outpatient Medications  Medication Sig Dispense Refill   atorvastatin (LIPITOR) 10 MG tablet Take 1 tablet (10 mg total) by mouth at bedtime. 90 tablet 1   metFORMIN (GLUCOPHAGE) 500 MG tablet Take 1 tablet (500 mg total) by mouth daily with breakfast. 90 tablet 1   nystatin-triamcinolone (MYCOLOG II) cream Apply 1 application topically 2 (two) times daily. 30 g 1   sertraline (ZOLOFT) 50 MG tablet Take 1.5 tablets (75 mg total) by mouth daily. 1 & 1/2 tab daily 135 tablet 3   triamterene-hydrochlorothiazide (MAXZIDE-25) 37.5-25 MG tablet TAKE 1 TABLET BY MOUTH DAILY. 90 tablet 3   Vitamin D, Ergocalciferol, (DRISDOL) 1.25 MG (50000 UNIT) CAPS capsule Take 1 capsule (50,000 Units total) by mouth every 7 (seven) days. 12 capsule 0   No current facility-administered  medications for this visit.    Family History  Problem Relation Age of Onset   Ovarian cancer Mother    Prostate cancer Father    Breast cancer Neg Hx    Colon cancer Neg Hx    Diabetes Neg Hx    Heart disease Neg Hx     Review of Systems  All other systems reviewed and are negative.  Exam:   BP 130/72   Pulse 72   Ht 5\' 6"  (1.676 m)   Wt 162 lb (73.5 kg)   SpO2 100%   BMI 26.15 kg/m   Weight change: @WEIGHTCHANGE @ Height:   Height: 5\' 6"  (167.6 cm)  Ht Readings from Last 3 Encounters:  11/17/20 5\' 6"  (1.676 m)  10/12/20 5\' 7"  (1.702 m)  11/14/19 5\' 7"   (1.702 m)    General appearance: alert, cooperative and appears stated age Head: Normocephalic, without obvious abnormality, atraumatic Neck: no adenopathy, supple, symmetrical, trachea midline and thyroid normal to inspection and palpation Lungs: clear to auscultation bilaterally Cardiovascular: regular rate and rhythm Breasts: normal appearance, no masses or tenderness Abdomen: soft, non-tender; non distended,  no masses,  no organomegaly Extremities: extremities normal, atraumatic, no cyanosis or edema Skin: Skin color, texture, turgor normal. No rashes or lesions Lymph nodes: Cervical, supraclavicular, and axillary nodes normal. No abnormal inguinal nodes palpated Neurologic: Grossly normal   Pelvic: External genitalia:  no lesions              Urethra:  normal appearing urethra with no masses, tenderness or lesions              Bartholins and Skenes: normal                 Vagina: normal appearing vagina with normal color and discharge, no lesions              Cervix: no lesions               Bimanual Exam:  Uterus:  uterus absent              Adnexa: no mass, fullness, tenderness               Rectovaginal: Confirms               Anus:  normal sphincter tone, no lesions  Gae Dry chaperoned for the exam.  1. Well woman exam Mammogram next month Colonoscopy UTD Labs UTD with her primary Discussed breast self exam Discussed calcium and vit D intake  2. Screening for cervical cancer - Cytology - PAP with hpv

## 2020-11-17 ENCOUNTER — Other Ambulatory Visit: Payer: Self-pay

## 2020-11-17 ENCOUNTER — Ambulatory Visit: Payer: 59 | Admitting: Obstetrics and Gynecology

## 2020-11-17 ENCOUNTER — Other Ambulatory Visit (HOSPITAL_COMMUNITY)
Admission: RE | Admit: 2020-11-17 | Discharge: 2020-11-17 | Disposition: A | Discharge status: Home / Self Care | Source: Ambulatory Visit | Attending: Obstetrics and Gynecology | Admitting: Obstetrics and Gynecology

## 2020-11-17 ENCOUNTER — Ambulatory Visit (INDEPENDENT_AMBULATORY_CARE_PROVIDER_SITE_OTHER): Admitting: Obstetrics and Gynecology

## 2020-11-17 ENCOUNTER — Encounter: Payer: Self-pay | Admitting: Obstetrics and Gynecology

## 2020-11-17 VITALS — BP 130/72 | HR 72 | Ht 66.0 in | Wt 162.0 lb

## 2020-11-17 DIAGNOSIS — Z124 Encounter for screening for malignant neoplasm of cervix: Secondary | ICD-10-CM | POA: Insufficient documentation

## 2020-11-17 DIAGNOSIS — Z01419 Encounter for gynecological examination (general) (routine) without abnormal findings: Secondary | ICD-10-CM

## 2020-11-18 ENCOUNTER — Other Ambulatory Visit: Payer: Self-pay | Admitting: Obstetrics and Gynecology

## 2020-11-18 DIAGNOSIS — Z1231 Encounter for screening mammogram for malignant neoplasm of breast: Secondary | ICD-10-CM

## 2020-11-24 LAB — CYTOLOGY - PAP
Comment: NEGATIVE
Diagnosis: REACTIVE
High risk HPV: NEGATIVE

## 2020-12-11 ENCOUNTER — Other Ambulatory Visit: Payer: Self-pay

## 2020-12-11 ENCOUNTER — Ambulatory Visit
Admission: RE | Admit: 2020-12-11 | Discharge: 2020-12-11 | Disposition: A | Discharge status: Home / Self Care | Source: Ambulatory Visit | Attending: Obstetrics and Gynecology | Admitting: Obstetrics and Gynecology

## 2020-12-11 DIAGNOSIS — Z1231 Encounter for screening mammogram for malignant neoplasm of breast: Secondary | ICD-10-CM | POA: Insufficient documentation

## 2020-12-25 ENCOUNTER — Other Ambulatory Visit: Payer: Self-pay

## 2020-12-29 ENCOUNTER — Other Ambulatory Visit: Payer: Self-pay

## 2021-01-03 ENCOUNTER — Encounter: Payer: Self-pay | Admitting: Family Medicine

## 2021-01-05 ENCOUNTER — Other Ambulatory Visit: Payer: Self-pay

## 2021-01-05 ENCOUNTER — Ambulatory Visit
Admission: RE | Admit: 2021-01-05 | Discharge: 2021-01-05 | Disposition: A | Discharge status: Home / Self Care | Source: Ambulatory Visit | Attending: Family Medicine | Admitting: Family Medicine

## 2021-01-05 VITALS — BP 131/67 | HR 76 | Temp 98.4°F | Resp 16

## 2021-01-05 DIAGNOSIS — U071 COVID-19: Secondary | ICD-10-CM

## 2021-01-05 DIAGNOSIS — J208 Acute bronchitis due to other specified organisms: Secondary | ICD-10-CM | POA: Diagnosis not present

## 2021-01-05 MED ORDER — DEXAMETHASONE SODIUM PHOSPHATE 10 MG/ML IJ SOLN
10.0000 mg | Freq: Once | INTRAMUSCULAR | Status: AC
  Administered 2021-01-05: 10 mg via INTRAMUSCULAR

## 2021-01-05 MED ORDER — PROMETHAZINE-DM 6.25-15 MG/5ML PO SYRP: 5.0000 mL | ORAL_SOLUTION | Freq: Four times a day (QID) | ORAL | 0 refills | Status: DC | PRN

## 2021-01-05 NOTE — ED Triage Notes (Signed)
Pt tested positive with an at home test 3 days ago. She has cough and chest congestion.

## 2021-01-05 NOTE — Discharge Instructions (Addendum)
Overall your physical exam is consistent with the recovery phase of COVID-19.  I have prescribed you Promethazine DM up to 4 times as needed daily for cough management.  I am also treating you with a Decadron injection here in clinic to open up your lungs as there was a presence of some coarseness on exam.  Also for any shortness of breath or chest tightness recommend use of albuterol inhaler 2 puffs every 4-6 hours as needed.  Drink plenty of fluids.  Also recommend supplements of vitamin C 1000 mg daily to help boost immune system.

## 2021-01-05 NOTE — ED Provider Notes (Signed)
[facility]    CSN: [id number] Arrival date & time: 01/05/21  1035      History   Chief Complaint Chief Complaint  Patient presents with   Covid Positive    HPI Rebecca Rogers is a 66 y.o. female.   HPI Patient presents today for evaluation of cough, nasal congestion, fatigue following a home positive COVID test 2 days ago.  Patient reports that she was exposed to [place] over the holidays and subsequently developed symptoms 3 days ago and had a positive home test.  She reports today she feels mostly improved however was concerned that the infection had gotten into her lungs as she is having a persistent cough.  She is unaware if she has had fever.  Continues to have some fatigue.  She has no history of asthma although has had to use an inhaler in the past for bronchial related upper respiratory illnesses. 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   Hypothyroidism 04/14/2016   Hx of colonic polyps    Benign neoplasm of transverse colon    Personal history of colonic polyps    Benign neoplasm of ascending colon    Benign neoplasm of cecum    Benign neoplasm of sigmoid colon    Anxiety 04/07/2015   Status post bilateral salpingo-oophorectomy (BSO) 04/07/2015   Status post laparoscopic supracervical hysterectomy 04/07/2015   Family history of ovarian cancer 04/07/2015   Hypertension 04/07/2015    Past Surgical History:  Procedure Laterality Date   ABDOMINAL HYSTERECTOMY     lsh- adenomysis and fibroids   BREAST BIOPSY Right 2016   NEG   BREAST CYST ASPIRATION Right 12/24/2019   FNA cyst    COLONOSCOPY WITH PROPOFOL N/A 06/05/2015   Procedure: COLONOSCOPY WITH PROPOFOL;  Surgeon: Lucilla Lame, MD;  Location: [place];  Service: Endoscopy;  Laterality: N/A;   COLONOSCOPY WITH PROPOFOL N/A 12/04/2015   Procedure: COLONOSCOPY WITH PROPOFOL;  Surgeon: Lucilla Lame, MD;  Location: [place];  Service: Endoscopy;  Laterality: N/A;   COLONOSCOPY WITH PROPOFOL N/A 12/21/2018   Procedure: COLONOSCOPY WITH PROPOFOL;  Surgeon: Lucilla Lame, MD;  Location: [facility] ENDOSCOPY;  Service: Endoscopy;  Laterality: N/A;   LAPAROSCOPIC SALPINGOOPHERECTOMY     prophylactic   POLYPECTOMY  06/05/2015   Procedure: POLYPECTOMY;  Surgeon: Lucilla Lame, MD;  Location: [place];  Service: Endoscopy;;   POLYPECTOMY  12/04/2015   Procedure: POLYPECTOMY;  Surgeon: Lucilla Lame, MD;  Location: [place];  Service: Endoscopy;;   TONSILLECTOMY  1965    OB History     Gravida  0   Para  0   Term  0   Preterm  0   AB  0   Living  0      SAB  0   IAB  0   Ectopic  0   Multiple  0   Live Births               Home Medications  Prior to Admission medications   Medication Sig Start Date End Date Taking? Authorizing Provider  promethazine-dextromethorphan (PROMETHAZINE-DM) 6.25-15 MG/5ML syrup Take 5 mLs by mouth 4 (four) times daily as needed for cough. 01/05/21  Yes Scot Jun, FNP  atorvastatin (LIPITOR) 10 MG tablet Take 1 tablet (10 mg total) by mouth at bedtime. 10/14/20   Vivi Barrack, MD  metFORMIN (GLUCOPHAGE) 500 MG tablet Take 1 tablet (500 mg total) by mouth daily with breakfast. 10/14/20   Vivi Barrack, MD  nystatin-triamcinolone [facility] II) cream Apply 1 application topically 2 (two) times daily. 08/10/17   Defrancesco, Alanda Slim, MD  sertraline (ZOLOFT) 50 MG tablet Take 1.5 tablets (75 mg total) by mouth daily. 1 & 1/2 tab daily 10/12/20   Vivi Barrack, MD  triamterene-hydrochlorothiazide (MAXZIDE-25) 37.5-25 MG tablet  TAKE 1 TABLET BY MOUTH DAILY. 10/12/20 10/12/21  Vivi Barrack, MD  Vitamin D, Ergocalciferol, (DRISDOL) 1.25 MG (50000 UNIT) CAPS capsule Take 1 capsule (50,000 Units total) by mouth every 7 (seven) days. 08/19/19   Abby Potash, PA-C    Family History Family History  Problem Relation Age of Onset   Ovarian cancer Mother    Prostate cancer Father    Breast cancer Neg Hx    Colon cancer Neg Hx    Diabetes Neg Hx    Heart disease Neg Hx     Social History Social History   Tobacco Use   Smoking status: Never   Smokeless tobacco: Never  Vaping Use   Vaping Use: Never used  Substance Use Topics   Alcohol use: No   Drug use: No     Allergies   Patient has no known allergies.   Review of Systems Review of Systems Pertinent negatives listed in HPI   Physical Exam Triage Vital Signs ED Triage Vitals  Enc Vitals Group     BP 01/05/21 1112 131/67     Pulse Rate 01/05/21 1112 76     Resp 01/05/21 1112 16     Temp 01/05/21 1112 98.4 F (36.9 C)     Temp Source 01/05/21 1112 Oral     SpO2 01/05/21 1112 95 %     Weight --      Height --      Head Circumference --      Peak Flow --      Pain Score 01/05/21 1111 0     Pain Loc --      Pain Edu? --      Excl. in [place]? --    No data found.  Updated Vital Signs BP 131/67 (BP Location: Left Arm)    Pulse 76    Temp 98.4 F (36.9 C) (Oral)    Resp 16    SpO2 95%   Visual Acuity Right Eye Distance:   Left Eye Distance:   Bilateral Distance:    Right Eye Near:   Left Eye Near:    Bilateral Near:     Physical Exam  General Appearance:    Alert, cooperative, no distress  HENT:   Normocephalic, ears normal, nares mucosal edema with congestion, rhinorrhea, oropharynx  patent   Eyes:    PERRL, conjunctiva/corneas clear, EOM's intact       Lungs:     Coarse lung sounds to auscultation bilaterally, respirations unlabored, no wheezing or crackles noted on exam  Heart:    Regular rate and rhythm  Neurologic:   Awake,  alert, oriented x 3. No apparent focal neurological  defect.      UC Treatments / Results  Labs (all labs ordered are listed, but only abnormal results are displayed) Labs Reviewed - No data to display  EKG   Radiology No results found.  Procedures Procedures (including critical care time)  Medications Ordered in UC Medications  dexamethasone (DECADRON) injection 10 mg (10 mg Intramuscular Given 01/05/21 1204)    Initial Impression / Assessment and Plan / UC Course  I have reviewed the triage vital signs and the nursing notes.  Pertinent labs & imaging results that were available during my care of the patient were reviewed by me and considered in my medical decision making (see chart for details).  COVID-19 virus infection with a secondary acute viral bronchitis Patient is generally well-appearing and vital signs are overall stable. Treated today with a Decadron IM injection 10 mg.  Patient will continue home management with Promethazine DM along with continued over-the-counter medications.  Patient also received albuterol inhaler which she is advised to use every 4-6 hours 2 puffs as needed for any chest tightness or chest congestion.  Strict return precautions given if symptoms worsen or do not readily improve Final Clinical Impressions(s) / UC Diagnoses   Final diagnoses:  COVID-19 virus infection  Acute viral bronchitis     Discharge Instructions      Overall your physical exam is consistent with the recovery phase of COVID-19.  I have prescribed you Promethazine DM up to 4 times as needed daily for cough management.  I am also treating you with a Decadron injection here in clinic to open up your lungs as there was a presence of some coarseness on exam.  Also for any shortness of breath or chest tightness recommend use of albuterol inhaler 2 puffs every 4-6 hours as needed.  Drink plenty of fluids.  Also recommend supplements of vitamin C 1000 mg daily to help  boost immune system.     ED Prescriptions     Medication Sig Dispense Auth. Provider   promethazine-dextromethorphan (PROMETHAZINE-DM) 6.25-15 MG/5ML syrup Take 5 mLs by mouth 4 (four) times daily as needed for cough. 180 mL Scot Jun, FNP      PDMP not reviewed this encounter.   Scot Jun, FNP 01/05/21 1234

## 2021-01-08 ENCOUNTER — Other Ambulatory Visit: Payer: Self-pay

## 2021-01-08 ENCOUNTER — Telehealth: Payer: Self-pay | Admitting: Family Medicine

## 2021-01-08 MED ORDER — PREDNISONE 20 MG PO TABS: 40.0000 mg | ORAL_TABLET | Freq: Every day | ORAL | 0 refills | Status: DC

## 2021-01-08 MED ORDER — PREDNISONE 20 MG PO TABS
40.0000 mg | ORAL_TABLET | Freq: Every day | ORAL | 0 refills | Status: DC
  Filled 2021-01-08: qty 10, 5d supply, fill #0

## 2021-01-08 MED ORDER — PROMETHAZINE-DM 6.25-15 MG/5ML PO SYRP
5.0000 mL | ORAL_SOLUTION | Freq: Four times a day (QID) | ORAL | 0 refills | Status: DC | PRN
  Filled 2021-01-08: qty 240, 12d supply, fill #0

## 2021-01-08 MED ORDER — PROMETHAZINE-DM 6.25-15 MG/5ML PO SYRP: 5.0000 mL | ORAL_SOLUTION | Freq: Four times a day (QID) | ORAL | 0 refills | Status: DC | PRN

## 2021-01-08 NOTE — Telephone Encounter (Signed)
Patient seen by me here in clinic 3 days ago she is in today with her husband and continues to have a persistent cough.  We discussed during her initial visit placing her on a 5-day taper of prednisone she wishes to have that option as her cough has not improved.  Also will refill her cough medication as a courtesy.

## 2021-01-08 NOTE — Telephone Encounter (Signed)
Resending medication to [employer],

## 2021-04-10 ENCOUNTER — Other Ambulatory Visit: Payer: Self-pay | Admitting: Family Medicine

## 2021-04-10 DIAGNOSIS — R7303 Prediabetes: Secondary | ICD-10-CM

## 2021-04-10 DIAGNOSIS — E1169 Type 2 diabetes mellitus with other specified complication: Secondary | ICD-10-CM

## 2021-08-11 ENCOUNTER — Encounter (INDEPENDENT_AMBULATORY_CARE_PROVIDER_SITE_OTHER): Payer: Self-pay

## 2021-08-13 IMAGING — MG US ASPIRATION RIGHT BREAST
1 series · 3 of 3 positions shown · non-contrast
Comparison: Previous exams.

CLINICAL DATA: 7 mm probable minimally complicated cyst in the 7
o'clock position of the right breast at recent mammography and
ultrasound. Ultrasound-guided aspiration and possible biopsy was
recommended.

EXAM:
ULTRASOUND GUIDED RIGHT BREAST CYST ASPIRATION

[Series 1: MG view · 0.06mm/px · 3 of 3 slices shown]
[im 1/3]
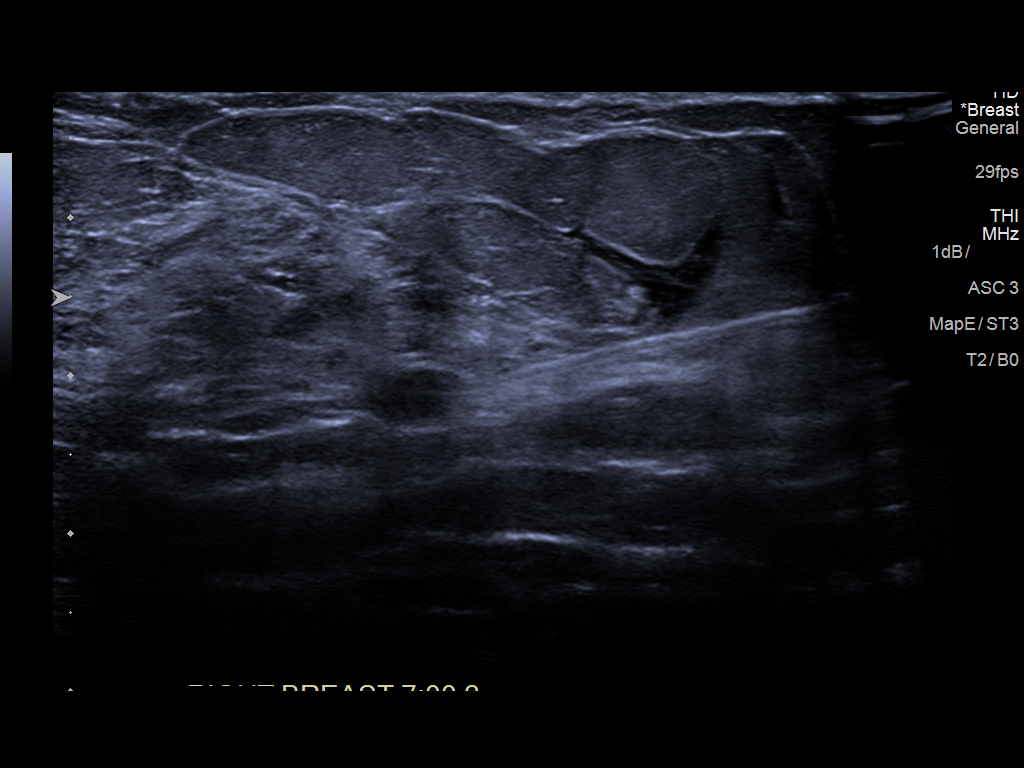
[im 2/3]
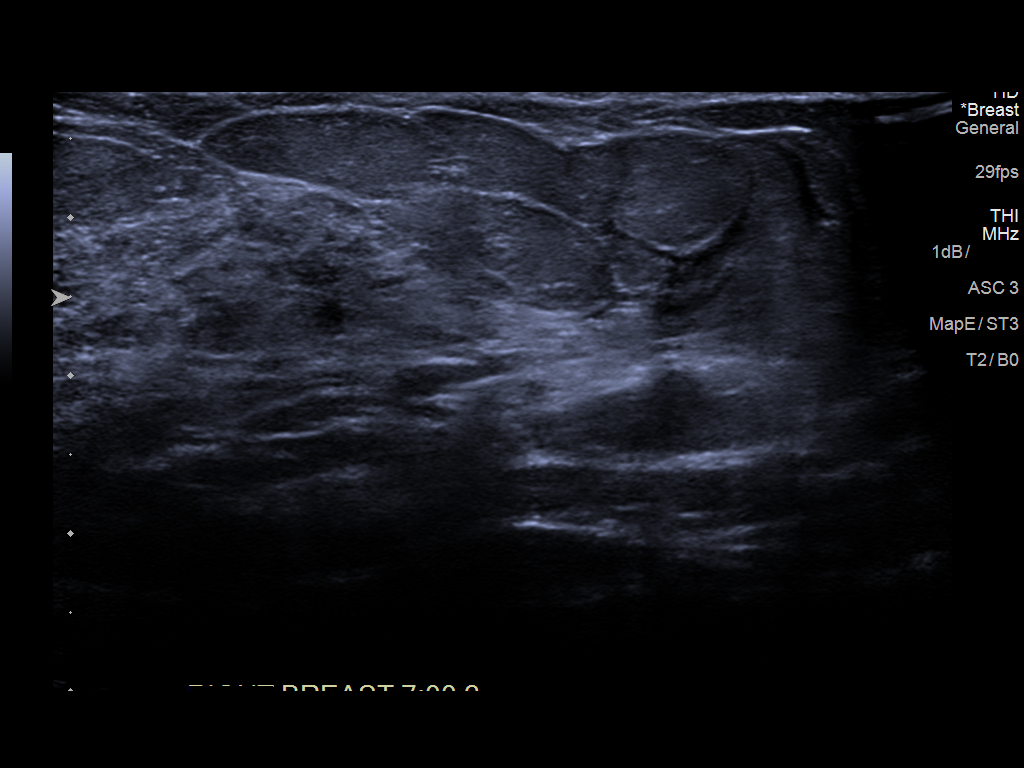
[im 3/3]
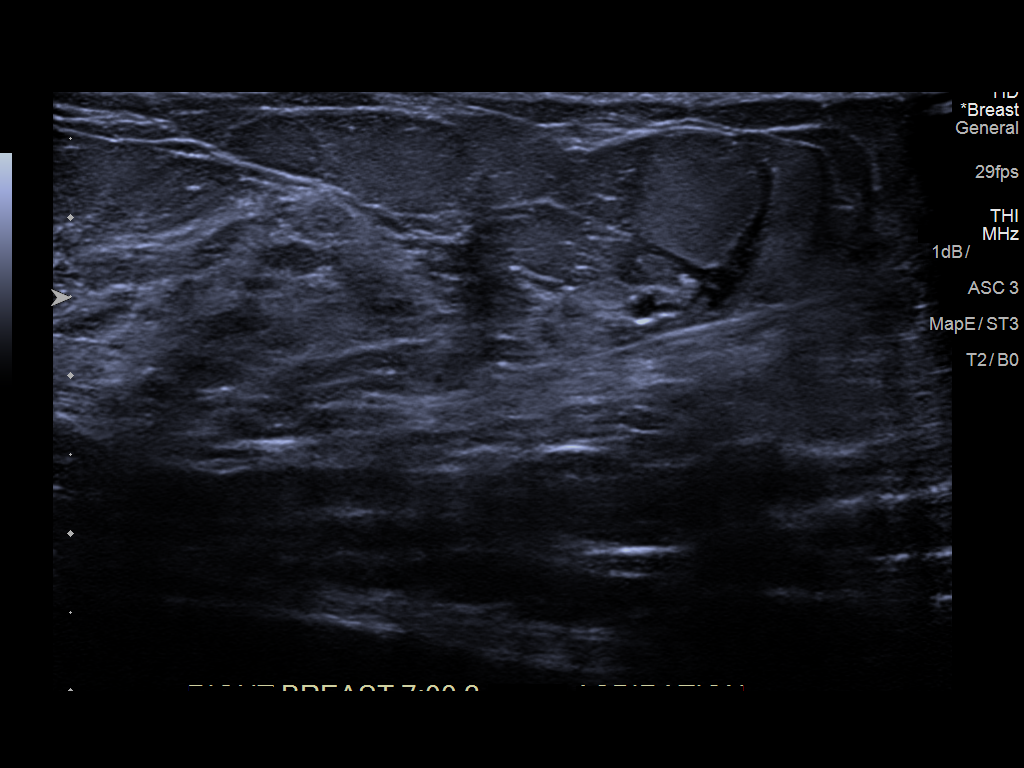

[3 of 3 positions shown; findings below may reference images not displayed]

PROCEDURE:
The patient and I discussed the procedure of ultrasound-guided
aspiration including benefits and alternatives. We discussed the
high likelihood of a successful procedure. We discussed the risks of
the procedure including infection, bleeding, tissue injury, and
inadequate sampling. Informed written consent was given. The usual
time out protocol was performed immediately prior to the procedure.

Using sterile technique, 1% lidocaine, under direct ultrasound
visualization, needle aspiration of the recently demonstrated 7 mm
hypoechoic mass in the 7 o'clock position of the right breast, 2 cm
from the nipple, was performed. This yielded less than 1 cc of
cloudy, gray colored fluid. This resulted in complete resolution of
the mass.
IMPRESSION: Ultrasound-guided aspiration of a minimally complicated, benign cyst
in the 7 o'clock position of the right breast. No apparent
complications.

RECOMMENDATIONS:
Bilateral screening mammogram in 1 year when due.

## 2021-09-27 ENCOUNTER — Encounter: Payer: Self-pay | Admitting: *Deleted

## 2021-10-04 ENCOUNTER — Other Ambulatory Visit: Payer: Self-pay | Admitting: Family Medicine

## 2021-10-04 DIAGNOSIS — E1169 Type 2 diabetes mellitus with other specified complication: Secondary | ICD-10-CM

## 2021-10-04 DIAGNOSIS — F419 Anxiety disorder, unspecified: Secondary | ICD-10-CM

## 2021-10-04 DIAGNOSIS — R7303 Prediabetes: Secondary | ICD-10-CM

## 2021-10-13 ENCOUNTER — Ambulatory Visit (INDEPENDENT_AMBULATORY_CARE_PROVIDER_SITE_OTHER): Admitting: Family Medicine

## 2021-10-13 ENCOUNTER — Encounter: Payer: Self-pay | Admitting: Family Medicine

## 2021-10-13 VITALS — BP 121/61 | HR 70 | Temp 98.0°F | Ht 66.0 in | Wt 161.8 lb

## 2021-10-13 DIAGNOSIS — Z0001 Encounter for general adult medical examination with abnormal findings: Secondary | ICD-10-CM

## 2021-10-13 DIAGNOSIS — R739 Hyperglycemia, unspecified: Secondary | ICD-10-CM

## 2021-10-13 DIAGNOSIS — I10 Essential (primary) hypertension: Secondary | ICD-10-CM | POA: Diagnosis not present

## 2021-10-13 DIAGNOSIS — F419 Anxiety disorder, unspecified: Secondary | ICD-10-CM | POA: Diagnosis not present

## 2021-10-13 DIAGNOSIS — E1169 Type 2 diabetes mellitus with other specified complication: Secondary | ICD-10-CM

## 2021-10-13 DIAGNOSIS — E038 Other specified hypothyroidism: Secondary | ICD-10-CM

## 2021-10-13 DIAGNOSIS — E559 Vitamin D deficiency, unspecified: Secondary | ICD-10-CM | POA: Diagnosis not present

## 2021-10-13 DIAGNOSIS — Z23 Encounter for immunization: Secondary | ICD-10-CM

## 2021-10-13 DIAGNOSIS — M2040 Other hammer toe(s) (acquired), unspecified foot: Secondary | ICD-10-CM

## 2021-10-13 DIAGNOSIS — F32A Depression, unspecified: Secondary | ICD-10-CM

## 2021-10-13 DIAGNOSIS — E785 Hyperlipidemia, unspecified: Secondary | ICD-10-CM

## 2021-10-13 LAB — CBC
HCT: 41.5 % (ref 36.0–46.0)
Hemoglobin: 13.9 g/dL (ref 12.0–15.0)
MCHC: 33.4 g/dL (ref 30.0–36.0)
MCV: 89.2 fl (ref 78.0–100.0)
Platelets: 371 10*3/uL (ref 150.0–400.0)
RBC: 4.65 Mil/uL (ref 3.87–5.11)
RDW: 14.3 % (ref 11.5–15.5)
WBC: 5.2 10*3/uL (ref 4.0–10.5)

## 2021-10-13 LAB — LIPID PANEL
Cholesterol: 214 mg/dL — ABNORMAL HIGH (ref 0–200)
HDL: 36.8 mg/dL — ABNORMAL LOW (ref >39.00)
LDL Cholesterol: 145 mg/dL — ABNORMAL HIGH (ref 0–99)
NonHDL: 177.69
Total CHOL/HDL Ratio: 6
Triglycerides: 165 mg/dL — ABNORMAL HIGH (ref 0.0–149.0)
VLDL: 33 mg/dL (ref 0.0–40.0)

## 2021-10-13 LAB — COMPREHENSIVE METABOLIC PANEL
ALT: 10 U/L (ref 0–35)
AST: 14 U/L (ref 0–37)
Albumin: 4.2 g/dL (ref 3.5–5.2)
Alkaline Phosphatase: 85 U/L (ref 39–117)
BUN: 17 mg/dL (ref 6–23)
CO2: 31 mEq/L (ref 19–32)
Calcium: 9.8 mg/dL (ref 8.4–10.5)
Chloride: 102 mEq/L (ref 96–112)
Creatinine, Ser: 0.82 mg/dL (ref 0.40–1.20)
GFR: 75.74 mL/min (ref >60.00)
Glucose, Bld: 120 mg/dL — ABNORMAL HIGH (ref 70–99)
Potassium: 4.5 mEq/L (ref 3.5–5.1)
Sodium: 139 mEq/L (ref 135–145)
Total Bilirubin: 0.6 mg/dL (ref 0.2–1.2)
Total Protein: 7.5 g/dL (ref 6.0–8.3)

## 2021-10-13 LAB — VITAMIN D 25 HYDROXY (VIT D DEFICIENCY, FRACTURES): VITD: 45.26 ng/mL (ref 30.00–100.00)

## 2021-10-13 LAB — TSH: TSH: 3.92 u[IU]/mL (ref 0.35–5.50)

## 2021-10-13 LAB — HEMOGLOBIN A1C: Hgb A1c MFr Bld: 6.5 % (ref 4.6–6.5)

## 2021-10-13 MED ORDER — ATORVASTATIN CALCIUM 10 MG PO TABS: 10.0000 mg | ORAL_TABLET | Freq: Every day | ORAL | 1 refills | Status: DC

## 2021-10-13 NOTE — Assessment & Plan Note (Signed)
Doing well on Zoloft 75 mg daily.

## 2021-10-13 NOTE — Progress Notes (Signed)
Chief Complaint:  Rebecca Rogers is a 66 y.o. female who presents today for her annual comprehensive physical exam.    Assessment/Plan:  Chronic Problems Addressed Today: Hypertension At goal on Maxide 37.5-25 once daily.  Check labs.  Anxiety Doing well on Zoloft 75 mg daily.  Hammer toe We discussed conservative measures.  She will try using metatarsal pads and shoes with good support.  She will let me know if not improving and we can refer to sports medicine or podiatry.  Depression Doing well on Zoloft 75 mg daily.  Dyslipidemia She has not been taking her cholesterol medication at night due to forgetting to do this.  Discussed that would be okay for her to take in the morning with the rest of her medications.  We will check lipids today. We will likely not increase dose as she has not been taking her current prescription regularly.  Vitamin D deficiency Check Vitamin D.   Hyperglycemia Check A1c.  Continue metformin 500 mg daily.  Hypothyroidism Check TSH.   Preventative Healthcare: Flu shot given today.  Up-to-date on cancer screening.  Check labs.  Patient Counseling(The following topics were reviewed and/or handout was given):  -Nutrition: Stressed importance of moderation in sodium/caffeine intake, saturated fat and cholesterol, caloric balance, sufficient intake of fresh fruits, vegetables, and fiber.  -Stressed the importance of regular exercise.   -Substance Abuse: Discussed cessation/primary prevention of tobacco, alcohol, or other drug use; driving or other dangerous activities under the influence; availability of treatment for abuse.   -Injury prevention: Discussed safety belts, safety helmets, smoke detector, smoking near bedding or upholstery.   -Sexuality: Discussed sexually transmitted diseases, partner selection, use of condoms, avoidance of unintended pregnancy and contraceptive alternatives.   -Dental health: Discussed importance of regular tooth  brushing, flossing, and dental visits.  -Health maintenance and immunizations reviewed. Please refer to Health maintenance section.  Return to care in 1 year for next preventative visit.     Subjective:  HPI:  She has no acute complaints today.  See A/P for status of chronic conditions.  Lifestyle Diet: Balanced.  Plenty of fruits and vegetables  Exercise: Gets a lot of steps in at work.      10/13/2021    8:33 AM  Depression screen PHQ 2/9  Decreased Interest 0  Down, Depressed, Hopeless 0  PHQ - 2 Score 0    Health Maintenance Due  Topic Date Due   Diabetic kidney evaluation - Urine ACR  Never done   Diabetic kidney evaluation - GFR measurement  10/12/2021     ROS: Per HPI, otherwise a complete review of systems was negative.   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   Hypothyroidism 04/14/2016   Hx of colonic polyps    Benign neoplasm of transverse colon    Personal history of colonic polyps    Benign neoplasm of ascending colon    Benign neoplasm of  cecum    Benign neoplasm of sigmoid colon    Anxiety 04/07/2015   Status post bilateral salpingo-oophorectomy (BSO) 04/07/2015   Status post laparoscopic supracervical hysterectomy 04/07/2015   Family history of ovarian cancer 04/07/2015   Hypertension 04/07/2015   Past Surgical History:  Procedure Laterality Date   ABDOMINAL HYSTERECTOMY     lsh- adenomysis and fibroids   BREAST BIOPSY Right 2016   NEG   BREAST CYST ASPIRATION Right  12/24/2019   FNA cyst   COLONOSCOPY WITH PROPOFOL N/A 06/05/2015   Procedure: COLONOSCOPY WITH PROPOFOL;  Surgeon: Lucilla Lame, MD;  Location: [place];  Service: Endoscopy;  Laterality: N/A;   COLONOSCOPY WITH PROPOFOL N/A 12/04/2015   Procedure: COLONOSCOPY WITH PROPOFOL;  Surgeon: Lucilla Lame, MD;  Location: [place];  Service: Endoscopy;  Laterality: N/A;   COLONOSCOPY WITH PROPOFOL N/A 12/21/2018   Procedure: COLONOSCOPY WITH PROPOFOL;  Surgeon: Lucilla Lame, MD;  Location: [facility] ENDOSCOPY;  Service: Endoscopy;  Laterality: N/A;   LAPAROSCOPIC SALPINGOOPHERECTOMY     prophylactic   POLYPECTOMY  06/05/2015   Procedure: POLYPECTOMY;  Surgeon: Lucilla Lame, MD;  Location: [place];  Service: Endoscopy;;   POLYPECTOMY  12/04/2015   Procedure: POLYPECTOMY;  Surgeon: Lucilla Lame, MD;  Location: [place];  Service: Endoscopy;;   TONSILLECTOMY  1965    Family History  Problem Relation Age of Onset   Ovarian cancer Mother    Prostate cancer Father    Breast cancer Neg Hx    Colon cancer Neg Hx    Diabetes Neg Hx    Heart disease Neg Hx     Medications- reviewed and updated Current Outpatient Medications  Medication Sig Dispense Refill   metFORMIN (GLUCOPHAGE) 500 MG tablet TAKE 1 TABLET(500 MG) BY MOUTH DAILY WITH BREAKFAST 90 tablet 1   nystatin-triamcinolone (MYCOLOG II) cream Apply 1 application topically 2 (two) times daily. 30 g 1   sertraline (ZOLOFT) 50 MG tablet TAKE 1 AND 1/2 TABLETS(75 MG) BY MOUTH DAILY 135 tablet 3   triamterene-hydrochlorothiazide (MAXZIDE-25) 37.5-25 MG tablet TAKE 1 TABLET BY MOUTH DAILY. 90 tablet 3   Vitamin D, Ergocalciferol, (DRISDOL) 1.25 MG (50000 UNIT) CAPS capsule Take 1 capsule (50,000 Units total) by mouth every 7 (seven) days. 12 capsule 0   atorvastatin (LIPITOR) 10 MG tablet Take 1 tablet (10 mg total) by mouth daily. 90 tablet 1   No current facility-administered medications for this visit.     Allergies-reviewed and updated No Known Allergies  Social History   Socioeconomic History   Marital status: Married    Spouse name: Lareina Espino   Number of children: Not on file   Years of education: Not on file   Highest education level: Not on file  Occupational History   Not on file  Tobacco Use   Smoking status: Never   Smokeless tobacco: Never  Vaping Use   Vaping Use: Never used  Substance and Sexual Activity   Alcohol use: No   Drug use: No   Sexual activity: Not Currently  Other Topics Concern   Not on file  Social History Narrative   Not on file   Social Determinants of Health   Financial Resource Strain: Not on file  Food Insecurity: Not on file  Transportation Needs: Not on file  Physical Activity: Inactive (04/13/2017)   Exercise Vital Sign    Days of Exercise per Week: 0 days    Minutes of Exercise per Session: 0 min  Stress: Not  on file  Social Connections: Not on file        Objective:  Physical Exam: BP 121/61   Pulse 70   Temp 98 F (36.7 C) (Temporal)   Ht '5\' 6"'$  (1.676 m)   Wt 161 lb 12.8 oz (73.4 kg)   SpO2 99%   BMI 26.12 kg/m   Body mass index is 26.12 kg/m. Wt Readings from Last 3 Encounters:  10/13/21 161 lb 12.8 oz (73.4 kg)  11/17/20 162 lb (73.5 kg)  10/12/20 163 lb (73.9 kg)   Gen: NAD, resting comfortably HEENT: TMs normal bilaterally. OP clear. No thyromegaly noted.  CV: RRR with no murmurs appreciated Pulm: NWOB, CTAB with no crackles, wheezes, or rhonchi GI: Normal bowel sounds present. Soft, Nontender, Nondistended. MSK: no edema, cyanosis, or clubbing noted.  Hammer toe deformity in right foot.  Right fifth digit tenderness palpation along the lateral edge.  Slight callus formation present.  Skin: warm, dry Neuro: CN2-12 grossly intact. Strength 5/5 in upper and lower extremities. Reflexes symmetric and intact bilaterally.  Psych: Normal affect and thought content     Ellamay Fors M. Jerline Pain, MD 10/13/2021 9:19 AM

## 2021-10-13 NOTE — Assessment & Plan Note (Signed)
Check A1c.  Continue metformin 500 mg daily. ?

## 2021-10-13 NOTE — Assessment & Plan Note (Signed)
Check Vitamin D.  

## 2021-10-13 NOTE — Assessment & Plan Note (Signed)
We discussed conservative measures.  She will try using metatarsal pads and shoes with good support.  She will let me know if not improving and we can refer to sports medicine or podiatry.

## 2021-10-13 NOTE — Patient Instructions (Signed)
It was very nice to see you today!  We will check blood work today.  It is okay for you to take your cholesterol medication in the morning.  Please try using a metatarsal pad and making sure that you have good support for your feet.  Let me know if your toe pain is not improving.  Please continue to work on diet and exercise.   We gave your flu shot today.  I will see back in year for your next physical.  Come back to see me sooner if needed.  Take care, Dr Parker  PLEASE NOTE:  If you had any lab tests please let us know if you have not heard back within a few days. You may see your results on mychart before we have a chance to review them but we will give you a call once they are reviewed by us. If we ordered any referrals today, please let us know if you have not heard from their office within the next week.   Please try these tips to maintain a healthy lifestyle:  Eat at least 3 REAL meals and 1-2 snacks per day.  Aim for no more than 5 hours between eating.  If you eat breakfast, please do so within one hour of getting up.   Each meal should contain half fruits/vegetables, one quarter protein, and one quarter carbs (no bigger than a computer mouse)  Cut down on sweet beverages. This includes juice, soda, and sweet tea.   Drink at least 1 glass of water with each meal and aim for at least 8 glasses per day  Exercise at least 150 minutes every week.    Preventive Care 40-64 Years Old, Female Preventive care refers to lifestyle choices and visits with your health care provider that can promote health and wellness. Preventive care visits are also called wellness exams. What can I expect for my preventive care visit? Counseling Your health care provider may ask you questions about your: Medical history, including: Past medical problems. Family medical history. Pregnancy history. Current health, including: Menstrual cycle. Method of birth control. Emotional  well-being. Home life and relationship well-being. Sexual activity and sexual health. Lifestyle, including: Alcohol, nicotine or tobacco, and drug use. Access to firearms. Diet, exercise, and sleep habits. Work and work environment. Sunscreen use. Safety issues such as seatbelt and bike helmet use. Physical exam Your health care provider will check your: Height and weight. These may be used to calculate your BMI (body mass index). BMI is a measurement that tells if you are at a healthy weight. Waist circumference. This measures the distance around your waistline. This measurement also tells if you are at a healthy weight and may help predict your risk of certain diseases, such as type 2 diabetes and high blood pressure. Heart rate and blood pressure. Body temperature. Skin for abnormal spots. What immunizations do I need?  Vaccines are usually given at various ages, according to a schedule. Your health care provider will recommend vaccines for you based on your age, medical history, and lifestyle or other factors, such as travel or where you work. What tests do I need? Screening Your health care provider may recommend screening tests for certain conditions. This may include: Lipid and cholesterol levels. Diabetes screening. This is done by checking your blood sugar (glucose) after you have not eaten for a while (fasting). Pelvic exam and Pap test. Hepatitis B test. Hepatitis C test. HIV (human immunodeficiency virus) test. STI (sexually transmitted infection) testing, if   you are at risk. Lung cancer screening. Colorectal cancer screening. Mammogram. Talk with your health care provider about when you should start having regular mammograms. This may depend on whether you have a family history of breast cancer. BRCA-related cancer screening. This may be done if you have a family history of breast, ovarian, tubal, or peritoneal cancers. Bone density scan. This is done to screen for  osteoporosis. Talk with your health care provider about your test results, treatment options, and if necessary, the need for more tests. Follow these instructions at home: Eating and drinking  Eat a diet that includes fresh fruits and vegetables, whole grains, lean protein, and low-fat dairy products. Take vitamin and mineral supplements as recommended by your health care provider. Do not drink alcohol if: Your health care provider tells you not to drink. You are pregnant, may be pregnant, or are planning to become pregnant. If you drink alcohol: Limit how much you have to 0-1 drink a day. Know how much alcohol is in your drink. In the [place] one drink equals one 12 oz bottle of beer (355 mL), one 5 oz glass of wine (148 mL), or one 1 oz glass of hard liquor (44 mL). Lifestyle Brush your teeth every morning and night with fluoride toothpaste. Floss one time each day. Exercise for at least 30 minutes 5 or more days each week. Do not use any products that contain nicotine or tobacco. These products include cigarettes, chewing tobacco, and vaping devices, such as e-cigarettes. If you need help quitting, ask your health care provider. Do not use drugs. If you are sexually active, practice safe sex. Use a condom or other form of protection to prevent STIs. If you do not wish to become pregnant, use a form of birth control. If you plan to become pregnant, see your health care provider for a prepregnancy visit. Take aspirin only as told by your health care provider. Make sure that you understand how much to take and what form to take. Work with your health care provider to find out whether it is safe and beneficial for you to take aspirin daily. Find healthy ways to manage stress, such as: Meditation, yoga, or listening to music. Journaling. Talking to a trusted person. Spending time with friends and family. Minimize exposure to UV radiation to reduce your risk of skin cancer. Safety Always wear  your seat belt while driving or riding in a vehicle. Do not drive: If you have been drinking alcohol. Do not ride with someone who has been drinking. When you are tired or distracted. While texting. If you have been using any mind-altering substances or drugs. Wear a helmet and other protective equipment during sports activities. If you have firearms in your house, make sure you follow all gun safety procedures. Seek help if you have been physically or sexually abused. What's next? Visit your health care provider once a year for an annual wellness visit. Ask your health care provider how often you should have your eyes and teeth checked. Stay up to date on all vaccines. This information is not intended to replace advice given to you by your health care provider. Make sure you discuss any questions you have with your health care provider. Document Revised: 06/17/2020 Document Reviewed: 06/17/2020 Elsevier Patient Education  [employer].

## 2021-10-13 NOTE — Assessment & Plan Note (Signed)
At goal on Maxide 37.5-25 once daily.  Check labs.

## 2021-10-13 NOTE — Assessment & Plan Note (Signed)
Check TSH 

## 2021-10-13 NOTE — Assessment & Plan Note (Signed)
She has not been taking her cholesterol medication at night due to forgetting to do this.  Discussed that would be okay for her to take in the morning with the rest of her medications.  We will check lipids today. We will likely not increase dose as she has not been taking her current prescription regularly.

## 2021-10-15 NOTE — Progress Notes (Signed)
Please inform patient of the following:  Cholesterol levels are elevated.  Would like for her to take the cholesterol medication in the morning as we discussed at her last office visit.  We can recheck this again in about a year.  Her A1c is elevated compared to last time.  She should increase her metformin to 500 mg twice daily  Back to see her back in about 6 months to recheck  Everything else is normal and we can recheck in a year.

## 2021-11-17 NOTE — Progress Notes (Signed)
66 y.o. G0P0000 Married White or Caucasian Not Hispanic or Latino female here for annual exam.  H/O laparoscopic Magdalena, then BSO. No vaginal bleeding. 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     No LMP recorded. Patient has had a hysterectomy.          Sexually active: No.  The current method of family planning is post menopausal status.    Exercising: No.   Patient has an active lifestyle  Smoker:  no  Health Maintenance: Pap: 11/17/20 WNL Hr HPV Neg  07/17/18- WNL, 12/06/17- LGSIL, HPV+, 11/08/16- ASCUS, HPV+ History of abnormal Pap:  yes; 04/07/16-WNL, HPV+; 05/04/16- ECC benign Bx CIN 1, 11/08/16- ASCUS, HPV+; Cx Bx- CIN 1, ECC- benign, 12/06/17- LGSIL, HPV+  MMG:  12/14/20 denisty C Bi-rads 1 neg  BMD:   02/03/09- WNL  Colonoscopy:  12/21/18- f/u in 5 years  TDaP:  up to date  Gardasil: n/a   reports that she has never smoked. She has never used smokeless tobacco. She reports that she does not drink alcohol and does not use drugs. Just occasional ETOH. 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   Past Medical History:  Diagnosis Date   Adenomyosis    Anxiety    Cyst of right breast    4-5 oclock 1/2 cm   HLD (hyperlipidemia)    Hypertension    bordeline   Hypothyroidism    Increased BMI     Past Surgical History:  Procedure Laterality Date   ABDOMINAL HYSTERECTOMY     lsh- adenomysis and fibroids   BREAST BIOPSY Right 2016   NEG   BREAST CYST ASPIRATION Right 12/24/2019   FNA cyst   COLONOSCOPY WITH PROPOFOL N/A 06/05/2015   Procedure: COLONOSCOPY WITH PROPOFOL;  Surgeon: Lucilla Lame, MD;  Location: [facility];  Service: Endoscopy;  Laterality: N/A;   COLONOSCOPY WITH PROPOFOL N/A 12/04/2015   Procedure: COLONOSCOPY WITH PROPOFOL;  Surgeon: Lucilla Lame, MD;  Location: [facility];  Service: Endoscopy;  Laterality: N/A;   COLONOSCOPY WITH PROPOFOL N/A 12/21/2018   Procedure: COLONOSCOPY WITH PROPOFOL;  Surgeon: Lucilla Lame, MD;  Location[facility] END[facility]COPY;  Service: Endoscopy;  Laterality: N/A;   LAPAROSCOPIC SALPINGOOPHERECTOMY     prophylactic   POLYPECTOMY  06/05/2015   Procedure: POLYPECTOMY;  Surgeon: Lucilla Lame, MD;  Location: [facility];  Service: Endoscopy;;   POLYPECTOMY  12/04/2015   Procedure: POLYPECTOMY;  Surgeon: Lucilla Lame, MD;  Location: [facility];  Service: Endoscopy;;   TONSILLECTOMY  1965    Current Outpatient Medications  Medication Sig Dispense Refill   atorvastatin (LIPITOR) 10 MG tablet Take 1 tablet (10 mg total) by mouth daily. 90 tablet 1   sertraline (ZOLOFT) 50 MG tablet TAKE 1 AND 1/2 TABLETS(75 MG) BY MOUTH DAILY 135 tablet 3   triamterene-hydrochlorothiazide (DYAZIDE) 37.5-25 MG capsule      triamterene-hydrochlorothiazide (MAXZIDE-25) 37.5-25 MG tablet TAKE 1 TABLET BY MOUTH DAILY. 90 tablet 3   No current facility-administered medications for this visit.    Family History  Problem Relation Age of Onset   Ovarian cancer Mother    Prostate cancer Father    Breast cancer Neg Hx    Colon cancer Neg Hx    Diabetes Neg Hx    Heart disease Neg Hx     Review of Systems  All other systems reviewed and are negative.   Exam:   BP 128/62   Pulse 66  Ht '5\' 6"'$  (1.676 m)   Wt 163 lb (73.9 kg)   SpO2 100%   BMI 26.31 kg/m   Weight change: '@WEIGHTCHANGE'$ @ Height:   Height: '5\' 6"'$  (167.6 cm)  Ht Readings from Last 3 Encounters:  11/24/21 '5\' 6"'$  (1.676 m)  10/13/21 '5\' 6"'$  (1.676 m)  11/17/20 '5\' 6"'$  (1.676 m)    General appearance: alert, cooperative and appears stated age Head: Normocephalic, without obvious abnormality, atraumatic Neck: no adenopathy, supple, symmetrical, trachea midline and thyroid normal to inspection and palpation Lungs: clear to auscultation bilaterally Cardiovascular: regular rate and rhythm Breasts: normal appearance, no masses or tenderness Abdomen: soft, non-tender; non distended,  no masses,  no  organomegaly Extremities: extremities normal, atraumatic, no cyanosis or edema Skin: Skin color, texture, turgor normal. No rashes or lesions Lymph nodes: Cervical, supraclavicular, and axillary nodes normal. No abnormal inguinal nodes palpated Neurologic: Grossly normal   Pelvic: External genitalia:  no lesions              Urethra:  normal appearing urethra with no masses, tenderness or lesions              Bartholins and Skenes: normal                 Vagina: normal appearing vagina with normal color and discharge, no lesions              Cervix: no lesions               Bimanual Exam:  Uterus:  uterus absent              Adnexa: no mass, fullness, tenderness               Rectovaginal: Confirms               Anus:  normal sphincter tone, no lesions  Gae Dry, CMA chaperoned for the exam.  1. Well woman exam Discussed breast self exam Discussed calcium and vit D intake No pap this year Mammogram due next month Colonoscopy UTD Labs with primary

## 2021-11-18 ENCOUNTER — Ambulatory Visit: Admitting: Obstetrics and Gynecology

## 2021-11-24 ENCOUNTER — Ambulatory Visit (INDEPENDENT_AMBULATORY_CARE_PROVIDER_SITE_OTHER): Admitting: Obstetrics and Gynecology

## 2021-11-24 ENCOUNTER — Encounter: Payer: Self-pay | Admitting: Obstetrics and Gynecology

## 2021-11-24 VITALS — BP 128/62 | HR 66 | Ht 66.0 in | Wt 163.0 lb

## 2021-11-24 DIAGNOSIS — Z01419 Encounter for gynecological examination (general) (routine) without abnormal findings: Secondary | ICD-10-CM | POA: Diagnosis not present

## 2021-11-24 NOTE — Patient Instructions (Signed)

## 2021-12-01 ENCOUNTER — Other Ambulatory Visit: Payer: Self-pay | Admitting: Obstetrics and Gynecology

## 2021-12-01 DIAGNOSIS — Z1231 Encounter for screening mammogram for malignant neoplasm of breast: Secondary | ICD-10-CM

## 2021-12-15 ENCOUNTER — Ambulatory Visit
Admission: RE | Admit: 2021-12-15 | Discharge: 2021-12-15 | Disposition: A | Discharge status: Home / Self Care | Source: Ambulatory Visit | Attending: Obstetrics and Gynecology | Admitting: Obstetrics and Gynecology

## 2021-12-15 DIAGNOSIS — Z1231 Encounter for screening mammogram for malignant neoplasm of breast: Secondary | ICD-10-CM | POA: Insufficient documentation

## 2021-12-23 ENCOUNTER — Other Ambulatory Visit: Payer: Self-pay | Admitting: Family Medicine

## 2022-03-29 ENCOUNTER — Other Ambulatory Visit: Payer: Self-pay | Admitting: Family Medicine

## 2022-03-29 DIAGNOSIS — E1169 Type 2 diabetes mellitus with other specified complication: Secondary | ICD-10-CM

## 2022-03-29 DIAGNOSIS — R7303 Prediabetes: Secondary | ICD-10-CM

## 2022-05-01 ENCOUNTER — Encounter: Payer: Self-pay | Admitting: Family Medicine

## 2022-05-02 ENCOUNTER — Other Ambulatory Visit: Payer: Self-pay

## 2022-05-02 MED ORDER — METFORMIN HCL 500 MG PO TABS: 500.0000 mg | ORAL_TABLET | Freq: Every day | ORAL | 3 refills | Status: DC

## 2022-05-02 NOTE — Telephone Encounter (Signed)
She should be on metformin 500mg  bid. Please clarify with patient and send in if needed.  Katina Degree. Jimmey Ralph, MD 05/02/2022 12:28 PM

## 2022-07-17 ENCOUNTER — Encounter: Payer: Self-pay | Admitting: Family Medicine

## 2022-09-22 ENCOUNTER — Other Ambulatory Visit: Payer: Self-pay | Admitting: Family Medicine

## 2022-09-22 DIAGNOSIS — E1169 Type 2 diabetes mellitus with other specified complication: Secondary | ICD-10-CM

## 2022-09-22 DIAGNOSIS — F419 Anxiety disorder, unspecified: Secondary | ICD-10-CM

## 2022-10-17 ENCOUNTER — Encounter: Payer: Self-pay | Admitting: Family Medicine

## 2022-10-17 ENCOUNTER — Ambulatory Visit (INDEPENDENT_AMBULATORY_CARE_PROVIDER_SITE_OTHER): Payer: Medicare PPO | Admitting: Family Medicine

## 2022-10-17 VITALS — BP 122/64 | HR 65 | Temp 97.1°F | Ht 66.0 in | Wt 165.8 lb

## 2022-10-17 DIAGNOSIS — I10 Essential (primary) hypertension: Secondary | ICD-10-CM

## 2022-10-17 DIAGNOSIS — R739 Hyperglycemia, unspecified: Secondary | ICD-10-CM | POA: Diagnosis not present

## 2022-10-17 DIAGNOSIS — Z0000 Encounter for general adult medical examination without abnormal findings: Secondary | ICD-10-CM

## 2022-10-17 DIAGNOSIS — F32A Depression, unspecified: Secondary | ICD-10-CM

## 2022-10-17 DIAGNOSIS — Z23 Encounter for immunization: Secondary | ICD-10-CM | POA: Diagnosis not present

## 2022-10-17 DIAGNOSIS — E785 Hyperlipidemia, unspecified: Secondary | ICD-10-CM | POA: Diagnosis not present

## 2022-10-17 DIAGNOSIS — E038 Other specified hypothyroidism: Secondary | ICD-10-CM | POA: Diagnosis not present

## 2022-10-17 DIAGNOSIS — F419 Anxiety disorder, unspecified: Secondary | ICD-10-CM | POA: Diagnosis not present

## 2022-10-17 LAB — COMPREHENSIVE METABOLIC PANEL
ALT: 11 U/L (ref 0–35)
AST: 12 U/L (ref 0–37)
Albumin: 4.1 g/dL (ref 3.5–5.2)
Alkaline Phosphatase: 106 U/L (ref 39–117)
BUN: 15 mg/dL (ref 6–23)
CO2: 31 meq/L (ref 19–32)
Calcium: 9.6 mg/dL (ref 8.4–10.5)
Chloride: 103 meq/L (ref 96–112)
Creatinine, Ser: 0.8 mg/dL (ref 0.40–1.20)
GFR: 77.46 mL/min (ref >60.00)
Glucose, Bld: 134 mg/dL — ABNORMAL HIGH (ref 70–99)
Potassium: 4.2 meq/L (ref 3.5–5.1)
Sodium: 140 meq/L (ref 135–145)
Total Bilirubin: 0.5 mg/dL (ref 0.2–1.2)
Total Protein: 7 g/dL (ref 6.0–8.3)

## 2022-10-17 LAB — LIPID PANEL
Cholesterol: 162 mg/dL (ref 0–200)
HDL: 36.8 mg/dL — ABNORMAL LOW (ref >39.00)
LDL Cholesterol: 99 mg/dL (ref 0–99)
NonHDL: 125.61
Total CHOL/HDL Ratio: 4
Triglycerides: 135 mg/dL (ref 0.0–149.0)
VLDL: 27 mg/dL (ref 0.0–40.0)

## 2022-10-17 LAB — CBC
HCT: 42 % (ref 36.0–46.0)
Hemoglobin: 13.7 g/dL (ref 12.0–15.0)
MCHC: 32.5 g/dL (ref 30.0–36.0)
MCV: 90.7 fL (ref 78.0–100.0)
Platelets: 382 10*3/uL (ref 150.0–400.0)
RBC: 4.64 Mil/uL (ref 3.87–5.11)
RDW: 14.7 % (ref 11.5–15.5)
WBC: 5.4 10*3/uL (ref 4.0–10.5)

## 2022-10-17 LAB — TSH: TSH: 4.82 u[IU]/mL (ref 0.35–5.50)

## 2022-10-17 LAB — HEMOGLOBIN A1C: Hgb A1c MFr Bld: 7.1 % — ABNORMAL HIGH (ref 4.6–6.5)

## 2022-10-17 NOTE — Patient Instructions (Signed)
It was very nice to see you today!  We gave your flu shot and pneumonia shot today.  Please continue to stay active.  Please continue to work on diet and exercise.  You are due for your bone density scan later this year.  Please make sure that you are getting 1200 mg of calcium and 800 international units of vitamin D daily.  We will see back in a year for your next physical.   Return in about 1 year (around 10/17/2023) for Annual Physical.   Take care, Dr Jimmey Ralph  PLEASE NOTE:  If you had any lab tests, please let us know if you have not heard back within a few days. You may see your results on mychart before we have a chance to review them but we will give you a call once they are reviewed by [name].   If we ordered any referrals today, please let us know if you have not heard from their office within the next week.   If you had any urgent prescriptions sent in today, please check with the pharmacy within an hour of our visit to make sure the prescription was transmitted appropriately.   Please try these tips to maintain a healthy lifestyle:  Eat at least 3 REAL meals and 1-2 snacks per day.  Aim for no more than 5 hours between eating.  If you eat breakfast, please do so within one hour of getting up.   Each meal should contain half fruits/vegetables, one quarter protein, and one quarter carbs (no bigger than a computer mouse)  Cut down on sweet beverages. This includes juice, soda, and sweet tea.   Drink at least 1 glass of water with each meal and aim for at least 8 glasses per day  Exercise at least 150 minutes every week.    Preventive Care 27 Years and Older, Female Preventive care refers to lifestyle choices and visits with your health care provider that can promote health and wellness. Preventive care visits are also called wellness exams. What can I expect for my preventive care visit? Counseling Your health care provider may ask you questions about your: Medical  history, including: Past medical problems. Family medical history. Pregnancy and menstrual history. History of falls. Current health, including: Memory and ability to understand (cognition). Emotional well-being. Home life and relationship well-being. Sexual activity and sexual health. Lifestyle, including: Alcohol, nicotine or tobacco, and drug use. Access to firearms. Diet, exercise, and sleep habits. Work and work Astronomer. Sunscreen use. Safety issues such as seatbelt and bike helmet use. Physical exam Your health care provider will check your: Height and weight. These may be used to calculate your BMI (body mass index). BMI is a measurement that tells if you are at a healthy weight. Waist circumference. This measures the distance around your waistline. This measurement also tells if you are at a healthy weight and may help predict your risk of certain diseases, such as type 2 diabetes and high blood pressure. Heart rate and blood pressure. Body temperature. Skin for abnormal spots. What immunizations do I need?  Vaccines are usually given at various ages, according to a schedule. Your health care provider will recommend vaccines for you based on your age, medical history, and lifestyle or other factors, such as travel or where you work. What tests do I need? Screening Your health care provider may recommend screening tests for certain conditions. This may include: Lipid and cholesterol levels. Hepatitis C test. Hepatitis B test. HIV (human immunodeficiency  virus) test. STI (sexually transmitted infection) testing, if you are at risk. Lung cancer screening. Colorectal cancer screening. Diabetes screening. This is done by checking your blood sugar (glucose) after you have not eaten for a while (fasting). Mammogram. Talk with your health care provider about how often you should have regular mammograms. BRCA-related cancer screening. This may be done if you have a family  history of breast, ovarian, tubal, or peritoneal cancers. Bone density scan. This is done to screen for osteoporosis. Talk with your health care provider about your test results, treatment options, and if necessary, the need for more tests. Follow these instructions at home: Eating and drinking  Eat a diet that includes fresh fruits and vegetables, whole grains, lean protein, and low-fat dairy products. Limit your intake of foods with high amounts of sugar, saturated fats, and salt. Take vitamin and mineral supplements as recommended by your health care provider. Do not drink alcohol if your health care provider tells you not to drink. If you drink alcohol: Limit how much you have to 0-1 drink a day. Know how much alcohol is in your drink. In the [place] one drink equals one 12 oz bottle of beer (355 mL), one 5 oz glass of wine (148 mL), or one 1 oz glass of hard liquor (44 mL). Lifestyle Brush your teeth every morning and night with fluoride toothpaste. Floss one time each day. Exercise for at least 30 minutes 5 or more days each week. Do not use any products that contain nicotine or tobacco. These products include cigarettes, chewing tobacco, and vaping devices, such as e-cigarettes. If you need help quitting, ask your health care provider. Do not use drugs. If you are sexually active, practice safe sex. Use a condom or other form of protection in order to prevent STIs. Take aspirin only as told by your health care provider. Make sure that you understand how much to take and what form to take. Work with your health care provider to find out whether it is safe and beneficial for you to take aspirin daily. Ask your health care provider if you need to take a cholesterol-lowering medicine (statin). Find healthy ways to manage stress, such as: Meditation, yoga, or listening to music. Journaling. Talking to a trusted person. Spending time with friends and family. Minimize exposure to UV radiation  to reduce your risk of skin cancer. Safety Always wear your seat belt while driving or riding in a vehicle. Do not drive: If you have been drinking alcohol. Do not ride with someone who has been drinking. When you are tired or distracted. While texting. If you have been using any mind-altering substances or drugs. Wear a helmet and other protective equipment during sports activities. If you have firearms in your house, make sure you follow all gun safety procedures. What's next? Visit your health care provider once a year for an annual wellness visit. Ask your health care provider how often you should have your eyes and teeth checked. Stay up to date on all vaccines. This information is not intended to replace advice given to you by your health care provider. Make sure you discuss any questions you have with your health care provider. Document Revised: 06/17/2020 Document Reviewed: 06/17/2020 Elsevier Patient Education  2024 [employer].

## 2022-10-17 NOTE — Assessment & Plan Note (Signed)
Check TSH 

## 2022-10-17 NOTE — Assessment & Plan Note (Signed)
Stable on Zoloft 75 mg daily.  She may be interested in increasing the dose at some point but would like to continue current dose for now.  She will send a message via MyChart if she is interested in increasing the dose.  We will go to 100 mg daily if needed.

## 2022-10-17 NOTE — Assessment & Plan Note (Signed)
She is taking her Lipitor 10 mg in the morning.  Will check lipids.

## 2022-10-17 NOTE — Assessment & Plan Note (Signed)
Blood pressure at goal today on Maxide 37.5-25 once daily.

## 2022-10-17 NOTE — Assessment & Plan Note (Signed)
Check A1c.  On metformin 500 mg daily.

## 2022-10-17 NOTE — Progress Notes (Signed)
Chief Complaint:  Rebecca Rogers is a 67 y.o. female who presents today for her annual comprehensive physical exam.    Assessment/Plan:  Chronic Problems Addressed Today: Anxiety Stable on Zoloft 75 mg daily.  She may be interested in increasing the dose at some point but would like to continue current dose for now.  She will send a message via MyChart if she is interested in increasing the dose.  We will go to 100 mg daily if needed.  Hypertension Blood pressure at goal today on Maxide 37.5-25 once daily.  Hypothyroidism Check TSH.   Hyperglycemia Check A1c.  On metformin 500 mg daily.  Dyslipidemia She is taking her Lipitor 10 mg in the morning.  Will check lipids.  Depression See anxiety A/P.  On Zoloft 75 mg daily.  She will let us know if she would like to increase to 100 mg daily between now and her next visit.  Preventative Healthcare: Flu vaccine and Prevnar 20 given today.  Check labs.  She will be following with gynecology later this year for women's health including mammogram and bone density scan.  Due for colonoscopy next year.  Patient Counseling(The following topics were reviewed and/or handout was given):  -Nutrition: Stressed importance of moderation in sodium/caffeine intake, saturated fat and cholesterol, caloric balance, sufficient intake of fresh fruits, vegetables, and fiber.  -Stressed the importance of regular exercise.   -Substance Abuse: Discussed cessation/primary prevention of tobacco, alcohol, or other drug use; driving or other dangerous activities under the influence; availability of treatment for abuse.   -Injury prevention: Discussed safety belts, safety helmets, smoke detector, smoking near bedding or upholstery.   -Sexuality: Discussed sexually transmitted diseases, partner selection, use of condoms, avoidance of unintended pregnancy and contraceptive alternatives.   -Dental health: Discussed importance of regular tooth brushing, flossing, and  dental visits.  -Health maintenance and immunizations reviewed. Please refer to Health maintenance section.  Return to care in 1 year for next preventative visit.     Subjective:  HPI:  She has no acute complaints today.  See A/P for status of chronic conditions.  She is doing well today.  Lifestyle Diet: Balanced. Plenty of fruits and vegetables.  Exercise: Very active with work.      10/17/2022    8:49 AM  Depression screen PHQ 2/9  Decreased Interest 0  Down, Depressed, Hopeless 0  PHQ - 2 Score 0    Health Maintenance Due  Topic Date Due   Medicare Annual Wellness (AWV)  Never done     ROS: Per HPI, otherwise a complete review of systems was negative.   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   Hypothyroidism 04/14/2016   Hx of colonic polyps    Benign neoplasm of transverse colon    History of colonic polyps    Benign neoplasm of ascending colon    Benign neoplasm of cecum    Benign neoplasm of  sigmoid colon    Anxiety 04/07/2015   Status post bilateral salpingo-oophorectomy (BSO) 04/07/2015   Status post laparoscopic supracervical hysterectomy 04/07/2015   Family history of ovarian cancer 04/07/2015   Hypertension 04/07/2015   Past Surgical History:  Procedure Laterality Date   ABDOMINAL HYSTERECTOMY     lsh- adenomysis and fibroids   BREAST BIOPSY Right 2016   NEG   BREAST CYST ASPIRATION Right 12/24/2019   FNA cyst   COLONOSCOPY WITH PROPOFOL N/A 06/05/2015   Procedure: COLONOSCOPY  WITH PROPOFOL;  Surgeon: Midge Minium, MD;  Location: [facility];  Service: Endoscopy;  Laterality: N/A;   COLONOSCOPY WITH PROPOFOL N/A 12/04/2015   Procedure: COLONOSCOPY WITH PROPOFOL;  Surgeon: Midge Minium, MD;  Location: [facility];  Service: Endoscopy;  Laterality: N/A;   COLONOSCOPY WITH PROPOFOL N/A 12/21/2018   Procedure: COLONOSCOPY WITH PROPOFOL;  Surgeon: Midge Minium, MD;  Location: [facility] END[facility]COPY;  Service: Endoscopy;  Laterality: N/A;   LAPAROSCOPIC SALPINGOOPHERECTOMY     prophylactic   POLYPECTOMY  06/05/2015   Procedure: POLYPECTOMY;  Surgeon: Midge Minium, MD;  Location: [facility];  Service: Endoscopy;;   POLYPECTOMY  12/04/2015   Procedure: POLYPECTOMY;  Surgeon: Midge Minium, MD;  Location: [name];  Service: Endoscopy;;   TONSILLECTOMY  1965    Family History  Problem Relation Age of Onset   Ovarian cancer Mother    Prostate cancer Father    Breast cancer Neg Hx    Colon cancer Neg Hx    Diabetes Neg Hx    Heart disease Neg Hx     Medications- reviewed and updated Current Outpatient Medications  Medication Sig Dispense Refill   atorvastatin (LIPITOR) 10 MG tablet TAKE 1 TABLET(10 MG) BY MOUTH AT BEDTIME 90 tablet 1   metFORMIN (GLUCOPHAGE) 500 MG tablet Take 1 tablet (500 mg total) by mouth daily with breakfast. 90 tablet 3   sertraline (ZOLOFT) 50 MG tablet TAKE 1 AND 1/2 TABLETS(75 MG) BY MOUTH DAILY 135 tablet 3   triamterene-hydrochlorothiazide (DYAZIDE) 37.5-25 MG capsule      triamterene-hydrochlorothiazide (MAXZIDE-25) 37.5-25 MG tablet TAKE 1 TABLET BY MOUTH DAILY 90 tablet 3   No current facility-administered medications for this visit.    Allergies-reviewed and updated No Known Allergies  Social History   Socioeconomic History   Marital status: Married    Spouse name: Jolyne Laye   Number of children: Not on file   Years of education: Not on file   Highest education level: Not on file  Occupational  History   Not on file  Tobacco Use   Smoking status: Never   Smokeless tobacco: Never  Vaping Use   Vaping status: Never Used  Substance and Sexual Activity   Alcohol use: No   Drug use: No   Sexual activity: Not Currently  Other Topics Concern   Not on file  Social History Narrative   Not on file   Social Determinants of Health   Financial Resource Strain: Not on file  Food Insecurity: Not on file  Transportation Needs: Not on file  Physical Activity: Inactive (04/13/2017)   Exercise Vital Sign    Days of Exercise per Week: 0 days    Minutes of Exercise per Session: 0 min  Stress: Not on file  Social Connections: Not on file        Objective:  Physical Exam: BP 122/64   Pulse 65   Temp (!) 97.1 F (36.2 C) (Temporal)   Ht 5\' 6"  (1.676  m)   Wt 165 lb 12.8 oz (75.2 kg)   SpO2 97%   BMI 26.76 kg/m   Body mass index is 26.76 kg/m. Wt Readings from Last 3 Encounters:  10/17/22 165 lb 12.8 oz (75.2 kg)  11/24/21 163 lb (73.9 kg)  10/13/21 161 lb 12.8 oz (73.4 kg)   Gen: NAD, resting comfortably HEENT: TMs normal bilaterally. OP clear. No thyromegaly noted.  CV: RRR with no murmurs appreciated Pulm: NWOB, CTAB with no crackles, wheezes, or rhonchi GI: Normal bowel sounds present. Soft, Nontender, Nondistended. MSK: no edema, cyanosis, or clubbing noted Skin: warm, dry Neuro: CN2-12 grossly intact. Strength 5/5 in upper and lower extremities. Reflexes symmetric and intact bilaterally.  Psych: Normal affect and thought content     Yasmeen Manka M. Jimmey Ralph, MD 10/17/2022 9:25 AM

## 2022-10-17 NOTE — Assessment & Plan Note (Signed)
See anxiety A/P.  On Zoloft 75 mg daily.  She will let us know if she would like to increase to 100 mg daily between now and her next visit.

## 2022-10-19 NOTE — Progress Notes (Signed)
Her A1c is elevated into the diabetic range.  We need to work on getting this lower.  We can increase her metformin to 500 mg twice daily.  She may be a candidate for a newer diabetes medication that can help with weight loss as well.  She can schedule appointment to discuss this if she wishes.  I would like to see her back in 3 months to recheck her A1c.  The rest of her labs are all stable.  Do not need to make any other adjustments to her treatment plan at this time.  We can recheck everything else in a year or so.

## 2022-10-21 ENCOUNTER — Other Ambulatory Visit: Payer: Self-pay | Admitting: *Deleted

## 2022-10-21 MED ORDER — METFORMIN HCL 1000 MG PO TABS: 1000.0000 mg | ORAL_TABLET | Freq: Every day | ORAL | 1 refills | Status: DC

## 2022-12-05 ENCOUNTER — Other Ambulatory Visit: Payer: Self-pay | Admitting: Obstetrics and Gynecology

## 2022-12-05 DIAGNOSIS — Z1231 Encounter for screening mammogram for malignant neoplasm of breast: Secondary | ICD-10-CM

## 2022-12-05 DIAGNOSIS — R87618 Other abnormal cytological findings on specimens from cervix uteri: Secondary | ICD-10-CM | POA: Diagnosis not present

## 2022-12-05 DIAGNOSIS — Z124 Encounter for screening for malignant neoplasm of cervix: Secondary | ICD-10-CM | POA: Diagnosis not present

## 2022-12-05 DIAGNOSIS — Z01419 Encounter for gynecological examination (general) (routine) without abnormal findings: Secondary | ICD-10-CM | POA: Diagnosis not present

## 2022-12-19 ENCOUNTER — Other Ambulatory Visit: Payer: Self-pay | Admitting: Family Medicine

## 2022-12-20 ENCOUNTER — Ambulatory Visit: Admitting: Obstetrics and Gynecology

## 2022-12-31 ENCOUNTER — Ambulatory Visit
Admission: RE | Admit: 2022-12-31 | Discharge: 2022-12-31 | Disposition: A | Discharge status: Home / Self Care | Payer: TRICARE For Life (TFL) | Source: Ambulatory Visit | Attending: Emergency Medicine | Admitting: Emergency Medicine

## 2022-12-31 VITALS — BP 117/76 | HR 84 | Temp 98.8°F | Resp 20

## 2022-12-31 DIAGNOSIS — J069 Acute upper respiratory infection, unspecified: Secondary | ICD-10-CM | POA: Diagnosis not present

## 2022-12-31 MED ORDER — AZITHROMYCIN 250 MG PO TABS
250.0000 mg | ORAL_TABLET | Freq: Every day | ORAL | 0 refills | Status: AC
Start: 2022-12-31 — End: ?

## 2022-12-31 NOTE — Discharge Instructions (Addendum)
Treat your symptoms with Tylenol or ibuprofen, plain Mucinex, rest, hydration.    If you are not improving, take the Zithromax as directed.    Follow-up with your primary care provider.

## 2022-12-31 NOTE — ED Provider Notes (Signed)
[name]    CSN: [id number] Arrival date & time: 12/31/22  1358      History   Chief Complaint Chief Complaint  Patient presents with   Cough    Entered by patient    HPI Rebecca Rogers is a 67 y.o. female.  Accompanied by her husband, patient presents with cough x 3 days.  No treatment attempted.  No fever, chest pain, shortness of breath.  Her medical history includes hypertension, hypothyroidism, hyperglycemia, dyslipidemia.  The history is provided by the patient, the spouse and medical records.    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   Hypothyroidism 04/14/2016   Hx of colonic polyps    Benign neoplasm of transverse colon    History of colonic polyps    Benign neoplasm of ascending colon    Benign neoplasm of cecum    Benign neoplasm of sigmoid colon    Anxiety 04/07/2015   Status post bilateral salpingo-oophorectomy (BSO) 04/07/2015   Status post laparoscopic supracervical hysterectomy 04/07/2015   Family history of ovarian cancer 04/07/2015   Hypertension 04/07/2015    Past Surgical History:  Procedure Laterality Date   ABDOMINAL HYSTERECTOMY     lsh- adenomysis and fibroids   BREAST BIOPSY Right 2016   NEG   BREAST CYST ASPIRATION Right 12/24/2019   FNA cyst   COLONOSCOPY WITH PROPOFOL N/A 06/05/2015   Procedure: COLONOSCOPY WITH PROPOFOL;  Surgeon: Midge Minium, MD;  Location: [facility];  Service: Endoscopy;  Laterality: N/A;   COLONOSCOPY WITH PROPOFOL N/A 12/04/2015   Procedure: COLONOSCOPY WITH PROPOFOL;  Surgeon: Midge Minium, MD;  Location: [facility];  Service: Endoscopy;  Laterality: N/A;   COLONOSCOPY WITH PROPOFOL N/A 12/21/2018   Procedure: COLONOSCOPY WITH PROPOFOL;  Surgeon: Midge Minium, MD;  Location: [facility];  Service: Endoscopy;  Laterality: N/A;   LAPAROSCOPIC SALPINGOOPHERECTOMY     prophylactic   POLYPECTOMY  06/05/2015   Procedure: POLYPECTOMY;  Surgeon: Midge Minium, MD;  Location: [facility];  Service: Endoscopy;;   POLYPECTOMY  12/04/2015   Procedure: POLYPECTOMY;  Surgeon: Midge Minium, MD;  Location: [facility];  Service: Endoscopy;;   TONSILLECTOMY  1965    OB History     Gravida  0   Para  0   Term  0   Preterm  0   AB  0   Living  0      SAB  0   IAB  0   Ectopic  0   Multiple  0   Live Births               Home Medications    Prior to Admission medications   Medication Sig Start Date End Date Taking? Authorizing Provider  azithromycin (ZITHROMAX) 250 MG tablet Take 1 tablet (250 mg total) by mouth daily. Take first 2 tablets together, then 1 every day until finished. 12/31/22  Yes Mickie Bail, NP  atorvastatin (LIPITOR) 10  MG tablet TAKE 1 TABLET(10 MG) BY MOUTH AT BEDTIME 09/22/22   Ardith Dark, MD  metFORMIN (GLUCOPHAGE) 1000 MG tablet Take 1 tablet (1,000 mg total) by mouth daily with breakfast. 10/21/22   Ardith Dark, MD  sertraline (ZOLOFT) 50 MG tablet TAKE 1 AND 1/2 TABLETS(75 MG) BY MOUTH DAILY 09/22/22   Ardith Dark, MD  triamterene-hydrochlorothiazide (DYAZIDE) 37.5-25 MG capsule     [provider]  triamterene-hydrochlorothiazide (MAXZIDE-25) 37.5-25 MG tablet TAKE 1 TABLET BY MOUTH DAILY 12/19/22 12/19/23  Ardith Dark, MD    Family History Family History  Problem Relation Age of Onset   Ovarian cancer Mother    Prostate cancer Father    Breast cancer Neg Hx    Colon cancer Neg Hx    Diabetes Neg Hx    Heart disease Neg Hx     Social History Social History   Tobacco Use    Smoking status: Never   Smokeless tobacco: Never  Vaping Use   Vaping status: Never Used  Substance Use Topics   Alcohol use: No   Drug use: No     Allergies   Patient has no known allergies.   Review of Systems Review of Systems  Constitutional:  Negative for chills and fever.  HENT:  Positive for congestion. Negative for ear pain and sore throat.   Respiratory:  Positive for cough. Negative for shortness of breath.   Cardiovascular:  Negative for chest pain and palpitations.     Physical Exam Triage Vital Signs ED Triage Vitals  Encounter Vitals Group     BP 12/31/22 1402 117/76     Systolic BP Percentile --      Diastolic BP Percentile --      Pulse Rate 12/31/22 1402 84     Resp 12/31/22 1402 20     Temp 12/31/22 1402 98.8 F (37.1 C)     Temp src --      SpO2 12/31/22 1402 97 %     Weight --      Height --      Head Circumference --      Peak Flow --      Pain Score 12/31/22 1359 0     Pain Loc --      Pain Education --      Exclude from Growth Chart --    No data found.  Updated Vital Signs BP 117/76   Pulse 84   Temp 98.8 F (37.1 C)   Resp 20   SpO2 97%   Visual Acuity Right Eye Distance:   Left Eye Distance:   Bilateral Distance:    Right Eye Near:   Left Eye Near:    Bilateral Near:     Physical Exam Vitals and nursing note reviewed.  Constitutional:      General: She is not in acute distress.    Appearance: She is well-developed.  HENT:     Right Ear: Tympanic membrane normal.     Left Ear: Tympanic membrane normal.     Nose: Rhinorrhea present.     Mouth/Throat:     Mouth: Mucous membranes are moist.     Pharynx: Oropharynx is clear.  Cardiovascular:     Rate and Rhythm: Normal rate and regular rhythm.     Heart sounds: Normal heart sounds.  Pulmonary:     Effort: Pulmonary effort is normal. No respiratory distress.     Breath sounds: Normal breath sounds.  Musculoskeletal:  Cervical back: Neck supple.  Skin:     General: Skin is warm and dry.  Neurological:     Mental Status: She is alert.      UC Treatments / Results  Labs (all labs ordered are listed, but only abnormal results are displayed) Labs Reviewed - No data to display  EKG   Radiology No results found.  Procedures Procedures (including critical care time)  Medications Ordered in UC Medications - No data to display  Initial Impression / Assessment and Plan / UC Course  I have reviewed the triage vital signs and the nursing notes.  Pertinent labs & imaging results that were available during my care of the patient were reviewed by me and considered in my medical decision making (see chart for details).    Acute upper respiratory infection.  Afebrile and vital signs are stable.  Lungs are clear and O2 sat is 97% on room air.  Discussed symptomatic management including Tylenol or ibuprofen, plain Mucinex, rest, hydration.  Patient is going out of town on vacation in 2 days.  Prescription for Zithromax sent to her pharmacy to start if she is not improving in 2-3 days with symptomatic management.  Instructed her to follow-up with her PCP.  Education provided on upper respiratory infection.  Patient agrees to plan of care.  Final Clinical Impressions(s) / UC Diagnoses   Final diagnoses:  Acute upper respiratory infection     Discharge Instructions      Treat your symptoms with Tylenol or ibuprofen, plain Mucinex, rest, hydration.    If you are not improving, take the Zithromax as directed.    Follow-up with your primary care provider.     ED Prescriptions     Medication Sig Dispense Auth. Provider   azithromycin (ZITHROMAX) 250 MG tablet Take 1 tablet (250 mg total) by mouth daily. Take first 2 tablets together, then 1 every day until finished. 6 tablet Mickie Bail, NP      PDMP not reviewed this encounter.   Mickie Bail, NP 12/31/22 [phone]

## 2022-12-31 NOTE — ED Triage Notes (Signed)
Patient to Urgent Care with complaints of cough. Describes a deep, dry cough. Denies any known fevers.  Symptoms started 3 days ago.  Meds:no otc.

## 2023-01-10 ENCOUNTER — Ambulatory Visit
Admission: RE | Admit: 2023-01-10 | Discharge: 2023-01-10 | Disposition: A | Discharge status: Home / Self Care | Payer: Medicare PPO | Source: Ambulatory Visit | Attending: Obstetrics and Gynecology | Admitting: Obstetrics and Gynecology

## 2023-01-10 DIAGNOSIS — Z1231 Encounter for screening mammogram for malignant neoplasm of breast: Secondary | ICD-10-CM | POA: Insufficient documentation

## 2023-01-11 DIAGNOSIS — H0288B Meibomian gland dysfunction left eye, upper and lower eyelids: Secondary | ICD-10-CM | POA: Diagnosis not present

## 2023-01-11 DIAGNOSIS — H0288A Meibomian gland dysfunction right eye, upper and lower eyelids: Secondary | ICD-10-CM | POA: Diagnosis not present

## 2023-01-11 DIAGNOSIS — H2513 Age-related nuclear cataract, bilateral: Secondary | ICD-10-CM | POA: Diagnosis not present

## 2023-01-11 DIAGNOSIS — E119 Type 2 diabetes mellitus without complications: Secondary | ICD-10-CM | POA: Diagnosis not present

## 2023-01-31 ENCOUNTER — Ambulatory Visit: Payer: TRICARE For Life (TFL) | Admitting: Family Medicine

## 2023-02-10 DIAGNOSIS — D2261 Melanocytic nevi of right upper limb, including shoulder: Secondary | ICD-10-CM | POA: Diagnosis not present

## 2023-02-10 DIAGNOSIS — D2272 Melanocytic nevi of left lower limb, including hip: Secondary | ICD-10-CM | POA: Diagnosis not present

## 2023-02-10 DIAGNOSIS — D485 Neoplasm of uncertain behavior of skin: Secondary | ICD-10-CM | POA: Diagnosis not present

## 2023-02-10 DIAGNOSIS — D2262 Melanocytic nevi of left upper limb, including shoulder: Secondary | ICD-10-CM | POA: Diagnosis not present

## 2023-02-10 DIAGNOSIS — L821 Other seborrheic keratosis: Secondary | ICD-10-CM | POA: Diagnosis not present

## 2023-02-10 DIAGNOSIS — D225 Melanocytic nevi of trunk: Secondary | ICD-10-CM | POA: Diagnosis not present

## 2023-02-10 DIAGNOSIS — D2271 Melanocytic nevi of right lower limb, including hip: Secondary | ICD-10-CM | POA: Diagnosis not present

## 2023-03-10 DIAGNOSIS — Z0000 Encounter for general adult medical examination without abnormal findings: Secondary | ICD-10-CM | POA: Diagnosis not present

## 2023-03-14 DIAGNOSIS — E7800 Pure hypercholesterolemia, unspecified: Secondary | ICD-10-CM | POA: Diagnosis not present

## 2023-03-14 DIAGNOSIS — E1169 Type 2 diabetes mellitus with other specified complication: Secondary | ICD-10-CM | POA: Diagnosis not present

## 2023-03-14 DIAGNOSIS — I10 Essential (primary) hypertension: Secondary | ICD-10-CM | POA: Diagnosis not present

## 2023-03-14 DIAGNOSIS — E785 Hyperlipidemia, unspecified: Secondary | ICD-10-CM | POA: Diagnosis not present

## 2023-03-17 ENCOUNTER — Other Ambulatory Visit: Payer: Self-pay | Admitting: Family Medicine

## 2023-03-17 DIAGNOSIS — M8588 Other specified disorders of bone density and structure, other site: Secondary | ICD-10-CM | POA: Diagnosis not present

## 2023-03-17 DIAGNOSIS — E1169 Type 2 diabetes mellitus with other specified complication: Secondary | ICD-10-CM

## 2023-04-04 DIAGNOSIS — E7800 Pure hypercholesterolemia, unspecified: Secondary | ICD-10-CM | POA: Diagnosis not present

## 2023-04-04 DIAGNOSIS — E785 Hyperlipidemia, unspecified: Secondary | ICD-10-CM | POA: Diagnosis not present

## 2023-04-04 DIAGNOSIS — M85852 Other specified disorders of bone density and structure, left thigh: Secondary | ICD-10-CM | POA: Diagnosis not present

## 2023-04-04 DIAGNOSIS — E1169 Type 2 diabetes mellitus with other specified complication: Secondary | ICD-10-CM | POA: Diagnosis not present

## 2023-04-19 ENCOUNTER — Other Ambulatory Visit: Payer: Self-pay | Admitting: *Deleted

## 2023-04-19 MED ORDER — METFORMIN HCL 1000 MG PO TABS: 1000.0000 mg | ORAL_TABLET | Freq: Every day | ORAL | 1 refills | Status: AC

## 2023-05-01 ENCOUNTER — Ambulatory Visit
Admission: RE | Admit: 2023-05-01 | Discharge: 2023-05-01 | Disposition: A | Discharge status: Home / Self Care | Source: Ambulatory Visit | Attending: Family Medicine | Admitting: Family Medicine

## 2023-05-01 VITALS — BP 102/64 | HR 79 | Temp 99.6°F | Resp 18

## 2023-05-01 DIAGNOSIS — B349 Viral infection, unspecified: Secondary | ICD-10-CM

## 2023-05-01 LAB — POC COVID19/FLU A&B COMBO
Covid Antigen, POC: NEGATIVE
Influenza A Antigen, POC: NEGATIVE
Influenza B Antigen, POC: NEGATIVE

## 2023-05-01 MED ORDER — PROMETHAZINE-DM 6.25-15 MG/5ML PO SYRP
5.0000 mL | ORAL_SOLUTION | Freq: Three times a day (TID) | ORAL | 0 refills | Status: AC | PRN
Start: 2023-05-01 — End: ?

## 2023-05-01 NOTE — ED Provider Notes (Addendum)
 Rebecca Rogers    CSN: [id number] Arrival date & time: 05/01/23  1645      History   Chief Complaint Chief Complaint  Patient presents with   Cough    Cough and sinus drainage - Entered by patient    HPI Rebecca Rogers is a 68 y.o. female  presents for evaluation of URI symptoms for 1 days. Patient reports associated symptoms of cough, congestion. Denies N/V/D, fevers, ear pain, bodyaches, shortness of breath. Patient does not have a hx of asthma. Patient is not an active smoker.   Reports no sick contacts.  Pt has taken nothing OTC for symptoms. Pt has no other concerns at this time.    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   Hypothyroidism 04/14/2016   Hx of colonic polyps    Benign neoplasm of transverse colon    History of colonic polyps    Benign neoplasm of ascending colon    Benign neoplasm of cecum    Benign neoplasm of sigmoid colon    Anxiety 04/07/2015   Status post bilateral salpingo-oophorectomy (BSO) 04/07/2015   Status post laparoscopic supracervical hysterectomy 04/07/2015   Family history of ovarian cancer 04/07/2015   Hypertension 04/07/2015    Past Surgical History:  Procedure Laterality Date   ABDOMINAL HYSTERECTOMY     lsh- adenomysis and fibroids   BREAST BIOPSY Right 2016   NEG   BREAST CYST ASPIRATION Right 12/24/2019   FNA cyst   COLONOSCOPY WITH PROPOFOL  N/A 06/05/2015   Procedure: COLONOSCOPY WITH PROPOFOL ;  Surgeon: Marnee Sink, MD;  Location: [facility];  Service: Endoscopy;  Laterality: N/A;    COLONOSCOPY WITH PROPOFOL  N/A 12/04/2015   Procedure: COLONOSCOPY WITH PROPOFOL ;  Surgeon: Marnee Sink, MD;  Location: [facility];  Service: Endoscopy;  Laterality: N/A;   COLONOSCOPY WITH PROPOFOL  N/A 12/21/2018   Procedure: COLONOSCOPY WITH PROPOFOL ;  Surgeon: Marnee Sink, MD;  Location: [facility] END[facility];  Service: Endoscopy;  Laterality: N/A;   LAPAROSCOPIC SALPINGOOPHERECTOMY     prophylactic   POLYPECTOMY  06/05/2015   Procedure: POLYPECTOMY;  Surgeon: Marnee Sink, MD;  Location: [facility];  Service: Endoscopy;;   POLYPECTOMY  12/04/2015   Procedure: POLYPECTOMY;  Surgeon: Marnee Sink, MD;  Location: [facility];  Service: Endoscopy;;   TONSILLECTOMY  1965    OB History     Gravida  0   Para  0   Term  0   Preterm  0   AB  0   Living  0      SAB  0   IAB  0   Ectopic  0   Multiple  0   Live Births               Home Medications    Prior to Admission medications   Medication Sig Start Date End Date Taking? Authorizing Provider  promethazine -dextromethorphan (PROMETHAZINE -DM) 6.25-15 MG/5ML syrup Take 5 mLs by mouth 3 (three)  times daily as needed for cough. 05/01/23  Yes Kaneshia Cater, Jodi R, NP  atorvastatin  (LIPITOR) 10 MG tablet TAKE 1 TABLET(10 MG) BY MOUTH AT BEDTIME 09/22/22   Rodney Clamp, MD  azithromycin  (ZITHROMAX ) 250 MG tablet Take 1 tablet (250 mg total) by mouth daily. Take first 2 tablets together, then 1 every day until finished. 12/31/22   Wellington Half, NP  metFORMIN  (GLUCOPHAGE ) 1000 MG tablet Take 1 tablet (1,000 mg total) by mouth daily with breakfast. 04/19/23   Rodney Clamp, MD  sertraline  (ZOLOFT ) 50 MG tablet TAKE 1 AND 1/2 TABLETS(75 MG) BY MOUTH DAILY 09/22/22   Rodney Clamp, MD  triamterene -hydrochlorothiazide  (DYAZIDE ) 37.5-25 MG capsule     [provider]  triamterene -hydrochlorothiazide  (MAXZIDE -25) 37.5-25 MG tablet TAKE 1 TABLET BY MOUTH DAILY 12/19/22 12/19/23  Rodney Clamp, MD     Family History Family History  Problem Relation Age of Onset   Ovarian cancer Mother    Prostate cancer Father    Breast cancer Neg Hx    Colon cancer Neg Hx    Diabetes Neg Hx    Heart disease Neg Hx     Social History Social History   Tobacco Use   Smoking status: Never   Smokeless tobacco: Never  Vaping Use   Vaping status: Never Used  Substance Use Topics   Alcohol use: No   Drug use: No     Allergies   Patient has no known allergies.   Review of Systems Review of Systems  HENT:  Positive for congestion.   Respiratory:  Positive for cough.      Physical Exam Triage Vital Signs ED Triage Vitals  Encounter Vitals Group     BP 05/01/23 1700 102/64     Systolic BP Percentile --      Diastolic BP Percentile --      Pulse Rate 05/01/23 1700 79     Resp 05/01/23 1700 18     Temp 05/01/23 1700 99.6 F (37.6 C)     Temp src --      SpO2 05/01/23 1700 95 %     Weight --      Height --      Head Circumference --      Peak Flow --      Pain Score 05/01/23 1706 0     Pain Loc --      Pain Education --      Exclude from Growth Chart --    No data found.  Updated Vital Signs BP 102/64   Pulse 79   Temp 99.6 F (37.6 C)   Resp 18   SpO2 95%   Visual Acuity Right Eye Distance:   Left Eye Distance:   Bilateral Distance:    Right Eye Near:   Left Eye Near:    Bilateral Near:     Physical Exam Vitals and nursing note reviewed.  Constitutional:      General: She is not in acute distress.    Appearance: She is well-developed. She is not ill-appearing.  HENT:     Head: Normocephalic and atraumatic.     Right Ear: Tympanic membrane and ear canal normal.     Left Ear: Tympanic membrane and ear canal normal.     Nose: Congestion present.     Mouth/Throat:     Mouth: Mucous membranes are moist.     Pharynx: Oropharynx is clear. Uvula midline. No oropharyngeal exudate or posterior oropharyngeal erythema.  Tonsils: No tonsillar exudate or  tonsillar abscesses.  Eyes:     Conjunctiva/sclera: Conjunctivae normal.     Pupils: Pupils are equal, round, and reactive to light.  Cardiovascular:     Rate and Rhythm: Normal rate and regular rhythm.     Heart sounds: Normal heart sounds.  Pulmonary:     Effort: Pulmonary effort is normal.     Breath sounds: Normal breath sounds. No wheezing, rhonchi or rales.  Musculoskeletal:     Cervical back: Normal range of motion and neck supple.  Lymphadenopathy:     Cervical: No cervical adenopathy.  Skin:    General: Skin is warm and dry.  Neurological:     General: No focal deficit present.     Mental Status: She is alert and oriented to person, place, and time.  Psychiatric:        Mood and Affect: Mood normal.        Behavior: Behavior normal.      UC Treatments / Results  Labs (all labs ordered are listed, but only abnormal results are displayed) Labs Reviewed  POC COVID19/FLU A&B COMBO   Comprehensive Metabolic Panel (CMP) Order: [id number] Component Ref Range & Units 1 mo ago  Glucose 70 - 110 mg/dL 621 High   Sodium 308 - 145 mmol/L 141  Potassium 3.6 - 5.1 mmol/L 4.1  Chloride 97 - 109 mmol/L 102  Carbon Dioxide (CO2) 22.0 - 32.0 mmol/L 30.1  Urea Nitrogen (BUN) 7 - 25 mg/dL 18  Creatinine 0.6 - 1.1 mg/dL 0.9  Glomerular Filtration Rate (eGFR) >60 mL/min/1.73sq m 71  Comment: CKD-EPI (2021) does not include patient's race in the calculation of eGFR.  Monitoring changes of plasma creatinine and eGFR over time is useful for monitoring kidney function.  Interpretive Ranges for eGFR (CKD-EPI 2021):  eGFR:       >60 mL/min/1.73 sq. m - Normal eGFR:       30-59 mL/min/1.73 sq. m - Moderately Decreased eGFR:       15-29 mL/min/1.73 sq. m  - Severely Decreased eGFR:       < 15 mL/min/1.73 sq. m  - Kidney Failure   Note: These eGFR calculations do not apply in acute situations when eGFR is changing rapidly or patients on dialysis.  Calcium  8.7 - 10.3 mg/dL 9.7   AST 8 - 39 U/L 18  ALT 5 - 38 U/L 16  Alk Phos (alkaline Phosphatase) 34 - 104 U/L 102  Albumin 3.5 - 4.8 g/dL 4.4  Bilirubin, Total 0.3 - 1.2 mg/dL 0.8  Protein, Total 6.1 - 7.9 g/dL 7.1  A/G Ratio 1.0 - 5.0 gm/dL 1.6  Resulting Agency [facility]   Specimen Collected: 03/14/23 09:15   Performed by: [facility] Last Resulted: 03/14/23 10:38  Received From: [facility]  Result Received: 03/17/23 09:32   EKG   Radiology No results found.  Procedures Procedures (including critical care time)  Medications Ordered in UC Medications - No data to display  Initial Impression / Assessment and Plan / UC Course  I have reviewed the triage vital signs and the nursing notes.  Pertinent labs & imaging results that were available during my care of the patient were reviewed by me and considered in my medical decision making (see chart for details).     Reviewed exam and symptoms with patient.  No red flags.  Negative COVID and flu test.  Discussed viral illness and symptomatic treatment.  Promethazine  DM as needed for cough, side effect profile reviewed.  Discussed rest fluids and PCP follow-up if symptoms do not improve.  ER precautions reviewed and patient verbalized understanding. Final Clinical Impressions(s) / UC Diagnoses   Final diagnoses:  Viral illness     Discharge Instructions      You have tested negative for COVID and flu. Please treat your symptoms with over the counter tylenol or ibuprofen , humidifier, and rest.  Take Promethazine  DM as needed for your cough.  Please note this medication will make you drowsy.  Do not drink alcohol or drive on this medication.  Viral illnesses can last 7-14 days. Please follow up with your PCP if your symptoms are not improving. Please go to the ER for any worsening symptoms. This includes but is not limited to fever you can not control with tylenol or ibuprofen , you are not able to  stay hydrated, you have shortness of breath or chest pain.  Thank you for choosing [place] for your healthcare needs. I hope you feel better soon!      ED Prescriptions     Medication Sig Dispense Auth. Provider   promethazine -dextromethorphan (PROMETHAZINE -DM) 6.25-15 MG/5ML syrup Take 5 mLs by mouth 3 (three) times daily as needed for cough. 118 mL Otoniel Myhand, Jodi R, NP      PDMP not reviewed this encounter.   Alleen Arbour, NP 05/01/23 1731    Alleen Arbour, NP 05/01/23 [phone]

## 2023-05-01 NOTE — ED Triage Notes (Signed)
 Patient presents to UC for cough, chest congestion, and sinus drainage since yesterday. Has not taking any OTC meds.

## 2023-05-01 NOTE — Discharge Instructions (Addendum)
 You have tested negative for COVID and flu. Please treat your symptoms with over the counter tylenol or ibuprofen , humidifier, and rest.  Take Promethazine  DM as needed for your cough.  Please note this medication will make you drowsy.  Do not drink alcohol or drive on this medication.  Viral illnesses can last 7-14 days. Please follow up with your PCP if your symptoms are not improving. Please go to the ER for any worsening symptoms. This includes but is not limited to fever you can not control with tylenol or ibuprofen , you are not able to stay hydrated, you have shortness of breath or chest pain.  Thank you for choosing [place] for your healthcare needs. I hope you feel better soon!

## 2023-05-09 DIAGNOSIS — E1169 Type 2 diabetes mellitus with other specified complication: Secondary | ICD-10-CM | POA: Diagnosis not present

## 2023-05-09 DIAGNOSIS — E7800 Pure hypercholesterolemia, unspecified: Secondary | ICD-10-CM | POA: Diagnosis not present

## 2023-05-09 DIAGNOSIS — E785 Hyperlipidemia, unspecified: Secondary | ICD-10-CM | POA: Diagnosis not present

## 2023-06-06 DIAGNOSIS — E7800 Pure hypercholesterolemia, unspecified: Secondary | ICD-10-CM | POA: Diagnosis not present

## 2023-06-06 DIAGNOSIS — E785 Hyperlipidemia, unspecified: Secondary | ICD-10-CM | POA: Diagnosis not present

## 2023-06-06 DIAGNOSIS — I10 Essential (primary) hypertension: Secondary | ICD-10-CM | POA: Diagnosis not present

## 2023-06-06 DIAGNOSIS — E1169 Type 2 diabetes mellitus with other specified complication: Secondary | ICD-10-CM | POA: Diagnosis not present

## 2023-06-16 DIAGNOSIS — F330 Major depressive disorder, recurrent, mild: Secondary | ICD-10-CM | POA: Diagnosis not present

## 2023-06-16 DIAGNOSIS — M85852 Other specified disorders of bone density and structure, left thigh: Secondary | ICD-10-CM | POA: Diagnosis not present

## 2023-06-16 DIAGNOSIS — E7800 Pure hypercholesterolemia, unspecified: Secondary | ICD-10-CM | POA: Diagnosis not present

## 2023-06-16 DIAGNOSIS — E119 Type 2 diabetes mellitus without complications: Secondary | ICD-10-CM | POA: Diagnosis not present

## 2023-06-16 DIAGNOSIS — I10 Essential (primary) hypertension: Secondary | ICD-10-CM | POA: Diagnosis not present

## 2023-08-28 ENCOUNTER — Ambulatory Visit
Admission: RE | Admit: 2023-08-28 | Discharge: 2023-08-28 | Disposition: A | Discharge status: Home / Self Care | Source: Ambulatory Visit | Attending: Emergency Medicine | Admitting: Emergency Medicine

## 2023-08-28 VITALS — BP 101/60 | HR 82 | Temp 99.1°F | Resp 18

## 2023-08-28 DIAGNOSIS — B349 Viral infection, unspecified: Secondary | ICD-10-CM | POA: Diagnosis not present

## 2023-08-28 DIAGNOSIS — U071 COVID-19: Secondary | ICD-10-CM

## 2023-08-28 LAB — POC SOFIA SARS ANTIGEN FIA: SARS Coronavirus 2 Ag: POSITIVE — AB

## 2023-08-28 MED ORDER — GUAIFENESIN-CODEINE 100-10 MG/5ML PO SOLN: 5.0000 mL | Freq: Four times a day (QID) | ORAL | 0 refills | Status: AC | PRN

## 2023-08-28 MED ORDER — PAXLOVID (300/100) 20 X 150 MG & 10 X 100MG PO TBPK: 3.0000 | ORAL_TABLET | Freq: Two times a day (BID) | ORAL | 0 refills | Status: AC

## 2023-08-28 MED ORDER — BENZONATATE 100 MG PO CAPS: 100.0000 mg | ORAL_CAPSULE | Freq: Three times a day (TID) | ORAL | 0 refills | Status: AC

## 2023-08-28 NOTE — ED Triage Notes (Signed)
 Patient reports dry cough x 2 days.

## 2023-08-28 NOTE — ED Provider Notes (Signed)
 CAY RALPH PELT    CSN: [id number] Arrival date & time: 08/28/23  1446      History   Chief Complaint Chief Complaint  Patient presents with   Cough    Cough, headache, runny nose - Entered by patient    HPI Rebecca Rogers is a 68 y.o. female.   Patient presents for evaluation of a nonproductive cough and general malaise present for 2 days.  Initially experienced sore throat which has resolved.  No known sick contact or recent travel.  Has not attempted treatment.  Tolerable to food and liquids.  Denies ear pain, congestion, shortness of breath or wheezing.  Denies respiratory history, non-smoker.  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   Hypothyroidism 04/14/2016   Hx of colonic polyps    Benign neoplasm of transverse colon    History of colonic polyps    Benign neoplasm of ascending colon    Benign neoplasm of cecum    Benign neoplasm of sigmoid colon    Anxiety 04/07/2015   Status post bilateral salpingo-oophorectomy (BSO) 04/07/2015   Status post laparoscopic supracervical hysterectomy 04/07/2015   Family history of ovarian cancer 04/07/2015   Hypertension 04/07/2015    Past Surgical History:  Procedure Laterality Date   ABDOMINAL HYSTERECTOMY     lsh- adenomysis and fibroids   BREAST BIOPSY Right 2016   NEG   BREAST CYST ASPIRATION Right 12/24/2019   FNA cyst   COLONOSCOPY WITH PROPOFOL  N/A 06/05/2015   Procedure: COLONOSCOPY WITH PROPOFOL ;  Surgeon: Rogelia Copping, MD;  Location: [facility];  Service: Endoscopy;  Laterality: N/A;    COLONOSCOPY WITH PROPOFOL  N/A 12/04/2015   Procedure: COLONOSCOPY WITH PROPOFOL ;  Surgeon: Rogelia Copping, MD;  Location: [facility];  Service: Endoscopy;  Laterality: N/A;   COLONOSCOPY WITH PROPOFOL  N/A 12/21/2018   Procedure: COLONOSCOPY WITH PROPOFOL ;  Surgeon: Copping Rogelia, MD;  Location: [facility];  Service: Endoscopy;  Laterality: N/A;   LAPAROSCOPIC SALPINGOOPHERECTOMY     prophylactic   POLYPECTOMY  06/05/2015   Procedure: POLYPECTOMY;  Surgeon: Rogelia Copping, MD;  Location: [facility];  Service: Endoscopy;;   POLYPECTOMY  12/04/2015   Procedure: POLYPECTOMY;  Surgeon: Rogelia Copping, MD;  Location: [facility];  Service: Endoscopy;;   TONSILLECTOMY  1965    OB History     Gravida  0   Para  0   Term  0   Preterm  0   AB  0   Living  0      SAB  0   IAB  0   Ectopic  0   Multiple  0   Live Births               Home Medications    Prior to Admission medications   Medication Sig Start Date End Date Taking? Authorizing Provider  benzonatate  (TESSALON ) 100 MG capsule Take 1 capsule (100 mg total) by mouth every 8 (eight) hours. 08/28/23  Yes  Briany Aye R, NP  guaiFENesin -codeine  100-10 MG/5ML syrup Take 5 mLs by mouth every 6 (six) hours as needed for cough. 08/28/23  Yes Caymen Dubray R, NP  nirmatrelvir/ritonavir (PAXLOVID , 300/100,) 20 x 150 MG & 10 x 100MG  TBPK Take 3 tablets by mouth 2 (two) times daily for 5 days. Patient GFR is 77. Take nirmatrelvir (150 mg) two tablets twice daily for 5 days and ritonavir (100 mg) one tablet twice daily for 5 days. 08/28/23 09/02/23 Yes Earnest Mcgillis, Shelba SAUNDERS, NP  atorvastatin  (LIPITOR) 10 MG tablet TAKE 1 TABLET(10 MG) BY MOUTH AT BEDTIME 09/22/22   [name], MD  azithromycin  (ZITHROMAX ) 250 MG tablet Take 1 tablet (250 mg total) by mouth daily. Take first 2 tablets together, then 1 every day until finished. 12/31/22   Corlis Burnard DEL, NP  metFORMIN  (GLUCOPHAGE ) 1000 MG tablet Take 1 tablet  (1,000 mg total) by mouth daily with breakfast. 04/19/23   [name], MD  promethazine -dextromethorphan (PROMETHAZINE -DM) 6.25-15 MG/5ML syrup Take 5 mLs by mouth 3 (three) times daily as needed for cough. 05/01/23   Loreda Myla SAUNDERS, NP  sertraline  (ZOLOFT ) 50 MG tablet TAKE 1 AND 1/2 TABLETS(75 MG) BY MOUTH DAILY 09/22/22   [name], MD  triamterene -hydrochlorothiazide  (DYAZIDE ) 37.5-25 MG capsule     [provider]  triamterene -hydrochlorothiazide  (MAXZIDE -25) 37.5-25 MG tablet TAKE 1 TABLET BY MOUTH DAILY 12/19/22 12/19/23  [name], MD    Family History Family History  Problem Relation Age of Onset   Ovarian cancer Mother    Prostate cancer Father    Breast cancer Neg Hx    Colon cancer Neg Hx    Diabetes Neg Hx    Heart disease Neg Hx     Social History Social History   Tobacco Use   Smoking status: Never   Smokeless tobacco: Never  Vaping Use   Vaping status: Never Used  Substance Use Topics   Alcohol use: No   Drug use: No     Allergies   Patient has no known allergies.   Review of Systems Review of Systems   Physical Exam Triage Vital Signs ED Triage Vitals  Encounter Vitals Group     BP 08/28/23 1501 101/60     Girls Systolic BP Percentile --      Girls Diastolic BP Percentile --      Boys Systolic BP Percentile --      Boys Diastolic BP Percentile --      Pulse Rate 08/28/23 1501 82     Resp 08/28/23 1501 18     Temp 08/28/23 1501 99.1 F (37.3 C)     Temp Source 08/28/23 1501 Oral     SpO2 08/28/23 1501 100 %     Weight --      Height --      Head Circumference --      Peak Flow --      Pain Score 08/28/23 1507 0     Pain Loc --      Pain Education --      Exclude from Growth Chart --    No data found.  Updated Vital Signs BP 101/60 (BP Location: Left Arm)   Pulse 82   Temp 99.1 F (37.3 C) (Oral)   Resp 18   SpO2 100%   Visual Acuity Right Eye Distance:   Left Eye Distance:   Bilateral Distance:     Right Eye Near:   Left Eye Near:  Bilateral Near:     Physical Exam Constitutional:      Appearance: Normal appearance.  HENT:     Head: Normocephalic.     Right Ear: Tympanic membrane, ear canal and external ear normal.     Left Ear: Tympanic membrane, ear canal and external ear normal.     Nose: Nose normal.     Mouth/Throat:     Mouth: Mucous membranes are moist.     Pharynx: Oropharynx is clear.  Eyes:     Extraocular Movements: Extraocular movements intact.  Cardiovascular:     Rate and Rhythm: Normal rate and regular rhythm.     Pulses: Normal pulses.     Heart sounds: Normal heart sounds.  Pulmonary:     Effort: Pulmonary effort is normal.     Breath sounds: Normal breath sounds.  Neurological:     Mental Status: She is alert and oriented to person, place, and time. Mental status is at baseline.      UC Treatments / Results  Labs (all labs ordered are listed, but only abnormal results are displayed) Labs Reviewed  POC SOFIA SARS ANTIGEN FIA - Abnormal; Notable for the following components:      Result Value   SARS Coronavirus 2 Ag Positive (*)    All other components within normal limits    EKG   Radiology No results found.  Procedures Procedures (including critical care time)  Medications Ordered in UC Medications - No data to display  Initial Impression / Assessment and Plan / UC Course  I have reviewed the triage vital signs and the nursing notes.  Pertinent labs & imaging results that were available during my care of the patient were reviewed by me and considered in my medical decision making (see chart for details).   COVID-19, viral illness  Patient is in no signs of distress nor toxic appearing.  Vital signs are stable.  Low suspicion for pneumonia, pneumothorax or bronchitis and therefore will defer imaging.  Discussed quarantine per the CDC.  History of diabetes prescribed Paxlovid  and advise discontinuation of atorvastatin  for 10  days.  Additionally prescribed Tessalon  and guaifenesin  codeine , PDMP reviewed low risk.May use additional over-the-counter medications as needed for supportive care.  May follow-up with urgent care as needed if symptoms persist or worsen.   Final Clinical Impressions(s) / UC Diagnoses   Final diagnoses:  Viral illness  COVID-19     Discharge Instructions      Covid 19 is a virus and should steadily improve in time it can take up to 7 to 10 days before you truly start to see a turnaround however things will get better    Per the CDC you will need to quarantine and to your 24 hours without fever, if no fever may continue activity wearing mask  Take Paxlovid  twice daily for 5 days to reduce the amount of virus in the body which helps to minimize symptoms but does not fully take away illness, while using stop atorvastatin  for 10 days, may resume on September 4  You may use Tessalon  pill every 8 hours as needed for coughing, may use cough syrup every 6 hours at bedtime to allow for rest  You can take Tylenol  as needed for fever reduction and pain relief.   For cough: honey 1/2 to 1 teaspoon (you can dilute the honey in water  or another fluid).  You can also use guaifenesin  and dextromethorphan for cough. You can use a humidifier for chest congestion  and cough.  If you don't have a humidifier, you can sit in the bathroom with the hot shower running.      For sore throat: try warm salt water  gargles, cepacol lozenges, throat spray, warm tea or water  with lemon/honey, popsicles or ice, or OTC cold relief medicine for throat discomfort.   For congestion: take a daily anti-histamine like Zyrtec, Claritin, and a oral decongestant, such as pseudoephedrine.  You can also use Flonase 1-2 sprays in each nostril daily.   It is important to stay hydrated: drink plenty of fluids (water , gatorade/powerade/pedialyte, juices, or teas) to keep your throat moisturized and help further relieve  irritation/discomfort.    ED Prescriptions     Medication Sig Dispense Auth. Provider   nirmatrelvir/ritonavir (PAXLOVID , 300/100,) 20 x 150 MG & 10 x 100MG  TBPK Take 3 tablets by mouth 2 (two) times daily for 5 days. Patient GFR is 77. Take nirmatrelvir (150 mg) two tablets twice daily for 5 days and ritonavir (100 mg) one tablet twice daily for 5 days. 30 tablet Tolbert Matheson R, NP   benzonatate  (TESSALON ) 100 MG capsule Take 1 capsule (100 mg total) by mouth every 8 (eight) hours. 21 capsule Fareed Fung R, NP   guaiFENesin -codeine  100-10 MG/5ML syrup Take 5 mLs by mouth every 6 (six) hours as needed for cough. 120 mL Shawndell Schillaci, Shelba SAUNDERS, NP      I have reviewed the PDMP during this encounter.   Teresa Shelba SAUNDERS, NP 08/28/23 1527

## 2023-08-28 NOTE — Discharge Instructions (Signed)
 Covid 19 is a virus and should steadily improve in time it can take up to 7 to 10 days before you truly start to see a turnaround however things will get better    Per the CDC you will need to quarantine and to your 24 hours without fever, if no fever may continue activity wearing mask  Take Paxlovid  twice daily for 5 days to reduce the amount of virus in the body which helps to minimize symptoms but does not fully take away illness, while using stop atorvastatin  for 10 days, may resume on September 4  You may use Tessalon  pill every 8 hours as needed for coughing, may use cough syrup every 6 hours at bedtime to allow for rest  You can take Tylenol  as needed for fever reduction and pain relief.   For cough: honey 1/2 to 1 teaspoon (you can dilute the honey in water  or another fluid).  You can also use guaifenesin  and dextromethorphan for cough. You can use a humidifier for chest congestion and cough.  If you don't have a humidifier, you can sit in the bathroom with the hot shower running.      For sore throat: try warm salt water  gargles, cepacol lozenges, throat spray, warm tea or water  with lemon/honey, popsicles or ice, or OTC cold relief medicine for throat discomfort.   For congestion: take a daily anti-histamine like Zyrtec, Claritin, and a oral decongestant, such as pseudoephedrine.  You can also use Flonase 1-2 sprays in each nostril daily.   It is important to stay hydrated: drink plenty of fluids (water , gatorade/powerade/pedialyte, juices, or teas) to keep your throat moisturized and help further relieve irritation/discomfort.

## 2023-09-13 ENCOUNTER — Telehealth: Payer: Self-pay

## 2023-09-13 NOTE — Telephone Encounter (Signed)
 Patient called stating that she was sent a letter that she is due for her repeat colonoscopy. Can you please give her a call back when you have a chance.

## 2023-09-13 NOTE — Telephone Encounter (Signed)
 Pt has been advised if Dr. Jinny has a cancellation for December 18 I will call to let her know, but for now I will have to call her back once the scope schedule has been created for January.  She will be due after Dec 18th 2025 for her 5 year repeat.  Thanks,  [place], CMA

## 2023-09-20 NOTE — Telephone Encounter (Signed)
 Contacted patient to let her know that Dr. Jinny has not had any cancellations as of now, however if she would like to be scheduled at [facility] I could schedule her with Dr. Melany.  She stated that she will wait to see if Dr. Jinny has any cancellations since she has a history with Dr. Jinny.  Thanks,  [place], CMA

## 2023-09-25 ENCOUNTER — Other Ambulatory Visit: Payer: Self-pay

## 2023-09-25 ENCOUNTER — Telehealth: Payer: Self-pay

## 2023-09-25 DIAGNOSIS — Z860100 Personal history of colon polyps, unspecified: Secondary | ICD-10-CM

## 2023-09-25 NOTE — Telephone Encounter (Signed)
 Gastroenterology Pre-Procedure Review  Request Date: 11/21/23 Requesting Physician: Dr. Jinny  PATIENT REVIEW QUESTIONS: The patient responded to the following health history questions as indicated:    1. Are you having any GI issues? no 2. Do you have a personal history of Polyps? yes (last colonoscopy performed 12/21/18 with Dr. Jinny.  Recommended repeat in 5 years) 3. Do you have a family history of Colon Cancer or Polyps? no 4. Diabetes Mellitus? Yes takes metformin  has been advised verbally and noted on instructions to stop 2 days prior to colonoscopy 5. Joint replacements in the past 12 months?no 6. Major health problems in the past 3 months?no 7. Any artificial heart valves, MVP, or defibrillator?no    MEDICATIONS & ALLERGIES:    Patient reports the following regarding taking any anticoagulation/antiplatelet therapy:   Plavix, Coumadin, Eliquis, Xarelto, Lovenox, Pradaxa, Brilinta, or Effient? no Aspirin? no  Patient confirms/reports the following medications:  Current Outpatient Medications  Medication Sig Dispense Refill   alendronate (FOSAMAX) 70 MG tablet Take 70 mg by mouth once a week.     atorvastatin  (LIPITOR) 10 MG tablet TAKE 1 TABLET(10 MG) BY MOUTH AT BEDTIME 90 tablet 1   azithromycin  (ZITHROMAX ) 250 MG tablet Take 1 tablet (250 mg total) by mouth daily. Take first 2 tablets together, then 1 every day until finished. 6 tablet 0   benzonatate  (TESSALON ) 100 MG capsule Take 1 capsule (100 mg total) by mouth every 8 (eight) hours. 21 capsule 0   guaiFENesin -codeine  100-10 MG/5ML syrup Take 5 mLs by mouth every 6 (six) hours as needed for cough. 120 mL 0   metFORMIN  (GLUCOPHAGE ) 1000 MG tablet Take 1 tablet (1,000 mg total) by mouth daily with breakfast. 90 tablet 1   promethazine -dextromethorphan (PROMETHAZINE -DM) 6.25-15 MG/5ML syrup Take 5 mLs by mouth 3 (three) times daily as needed for cough. 118 mL 0   sertraline  (ZOLOFT ) 50 MG tablet TAKE 1 AND 1/2 TABLETS(75 MG)  BY MOUTH DAILY 135 tablet 3   triamterene -hydrochlorothiazide  (DYAZIDE ) 37.5-25 MG capsule  (Patient not taking: Reported on 12/31/2022)     triamterene -hydrochlorothiazide  (MAXZIDE -25) 37.5-25 MG tablet TAKE 1 TABLET BY MOUTH DAILY 90 tablet 3   No current facility-administered medications for this visit.    Patient confirms/reports the following allergies:  No Known Allergies  No orders of the defined types were placed in this encounter.   AUTHORIZATION INFORMATION Primary Insurance: 1D#: Group #:  Secondary Insurance: 1D#: Group #:  SCHEDULE INFORMATION: Date: 11/21/23 Time: Location: [facility]

## 2023-10-19 ENCOUNTER — Encounter: Payer: TRICARE For Life (TFL) | Admitting: Family Medicine

## 2023-10-20 ENCOUNTER — Encounter: Payer: TRICARE For Life (TFL) | Admitting: Family Medicine

## 2023-11-13 ENCOUNTER — Other Ambulatory Visit: Payer: Self-pay | Admitting: Obstetrics and Gynecology

## 2023-11-13 DIAGNOSIS — Z1231 Encounter for screening mammogram for malignant neoplasm of breast: Secondary | ICD-10-CM

## 2023-11-14 ENCOUNTER — Other Ambulatory Visit: Payer: Self-pay

## 2023-11-14 MED ORDER — NA SULFATE-K SULFATE-MG SULF 17.5-3.13-1.6 GM/177ML PO SOLN: 1.0000 | Freq: Once | ORAL | 0 refills | Status: AC

## 2023-11-21 ENCOUNTER — Ambulatory Visit

## 2023-11-21 ENCOUNTER — Encounter: Payer: Self-pay | Admitting: Gastroenterology

## 2023-11-21 ENCOUNTER — Other Ambulatory Visit: Payer: Self-pay

## 2023-11-21 ENCOUNTER — Ambulatory Visit
Admission: RE | Admit: 2023-11-21 | Discharge: 2023-11-21 | Disposition: A | Discharge status: Home / Self Care | Attending: Gastroenterology | Admitting: Gastroenterology

## 2023-11-21 ENCOUNTER — Encounter
Admission: RE | Disposition: A | Discharge status: Home / Self Care | Payer: Self-pay | Source: Home / Self Care | Attending: Gastroenterology

## 2023-11-21 DIAGNOSIS — Z860101 Personal history of adenomatous and serrated colon polyps: Secondary | ICD-10-CM | POA: Diagnosis not present

## 2023-11-21 DIAGNOSIS — Z1211 Encounter for screening for malignant neoplasm of colon: Secondary | ICD-10-CM | POA: Insufficient documentation

## 2023-11-21 DIAGNOSIS — K5730 Diverticulosis of large intestine without perforation or abscess without bleeding: Secondary | ICD-10-CM | POA: Insufficient documentation

## 2023-11-21 DIAGNOSIS — I10 Essential (primary) hypertension: Secondary | ICD-10-CM | POA: Insufficient documentation

## 2023-11-21 DIAGNOSIS — K641 Second degree hemorrhoids: Secondary | ICD-10-CM | POA: Insufficient documentation

## 2023-11-21 DIAGNOSIS — Z7984 Long term (current) use of oral hypoglycemic drugs: Secondary | ICD-10-CM | POA: Insufficient documentation

## 2023-11-21 DIAGNOSIS — E119 Type 2 diabetes mellitus without complications: Secondary | ICD-10-CM | POA: Diagnosis not present

## 2023-11-21 DIAGNOSIS — Z860100 Personal history of colon polyps, unspecified: Secondary | ICD-10-CM

## 2023-11-21 HISTORY — DX: Type 2 diabetes mellitus without complications: E11.9

## 2023-11-21 HISTORY — PX: COLONOSCOPY: SHX5424

## 2023-11-21 LAB — GLUCOSE, CAPILLARY: Glucose-Capillary: 154 mg/dL — ABNORMAL HIGH (ref 70–99)

## 2023-11-21 SURGERY — COLONOSCOPY
Anesthesia: General

## 2023-11-21 MED ORDER — PROPOFOL 1000 MG/100ML IV EMUL
INTRAVENOUS | Status: AC
  Filled 2023-11-21: qty 100

## 2023-11-21 MED ORDER — SODIUM CHLORIDE 0.9 % IV SOLN: INTRAVENOUS | Status: DC

## 2023-11-21 MED ORDER — LIDOCAINE HCL (CARDIAC) PF 100 MG/5ML IV SOSY
PREFILLED_SYRINGE | INTRAVENOUS | Status: DC | PRN
  Administered 2023-11-21: 60 mg via INTRAVENOUS

## 2023-11-21 MED ORDER — PROPOFOL 500 MG/50ML IV EMUL
INTRAVENOUS | Status: DC | PRN
Start: 2023-11-21 — End: 2023-11-21
  Administered 2023-11-21: 75 ug/kg/min via INTRAVENOUS

## 2023-11-21 MED ORDER — LIDOCAINE HCL (PF) 2 % IJ SOLN
INTRAMUSCULAR | Status: AC
Start: 2023-11-21 — End: 2023-11-21
  Filled 2023-11-21: qty 5

## 2023-11-21 MED ORDER — DEXMEDETOMIDINE HCL IN NACL 80 MCG/20ML IV SOLN
INTRAVENOUS | Status: DC | PRN
  Administered 2023-11-21: 12 ug via INTRAVENOUS
  Administered 2023-11-21: 8 ug via INTRAVENOUS

## 2023-11-21 MED ORDER — PROPOFOL 10 MG/ML IV BOLUS
INTRAVENOUS | Status: DC | PRN
  Administered 2023-11-21 (×2): 40 mg via INTRAVENOUS

## 2023-11-21 MED ORDER — DEXMEDETOMIDINE HCL IN NACL 80 MCG/20ML IV SOLN
INTRAVENOUS | Status: AC
  Filled 2023-11-21: qty 20

## 2023-11-21 NOTE — Anesthesia Preprocedure Evaluation (Addendum)
 Anesthesia Evaluation  Patient identified by MRN, date of birth, ID band Patient awake    Reviewed: Allergy & Precautions, H&P , NPO status , Patient's Chart, lab work & pertinent test results, reviewed documented beta blocker date and time   History of Anesthesia Complications Negative for: history of anesthetic complications  Airway Mallampati: III  TM Distance: >3 FB Neck ROM: full    Dental  (+) Dental Advidsory Given, Teeth Intact   Pulmonary neg pulmonary ROS   Pulmonary exam normal        Cardiovascular Exercise Tolerance: Good hypertension, (-) angina (-) Past MI and (-) Cardiac Stents Normal cardiovascular exam(-) dysrhythmias (-) Valvular Problems/Murmurs     Neuro/Psych  PSYCHIATRIC DISORDERS Anxiety Depression    negative neurological ROS     GI/Hepatic negative GI ROS, Neg liver ROS,,,  Endo/Other  diabetesHypothyroidism    Renal/GU negative Renal ROS  negative genitourinary   Musculoskeletal   Abdominal   Peds  Hematology negative hematology ROS (+)   Anesthesia Other Findings    Reproductive/Obstetrics negative OB ROS                              Anesthesia Physical Anesthesia Plan  ASA: 2  Anesthesia Plan: General   Post-op Pain Management:    Induction: Intravenous  PONV Risk Score and Plan: 3 and Propofol  infusion and TIVA  Airway Management Planned: Natural Airway and Nasal Cannula  Additional Equipment:   Intra-op Plan:   Post-operative Plan:   Informed Consent: I have reviewed the patients History and Physical, chart, labs and discussed the procedure including the risks, benefits and alternatives for the proposed anesthesia with the patient or authorized representative who has indicated his/her understanding and acceptance.       Plan Discussed with: CRNA  Anesthesia Plan Comments: (IVGA with LMA as backup. Has had IVGA in past with no AC. )          Anesthesia Quick Evaluation

## 2023-11-21 NOTE — Transfer of Care (Signed)
 Immediate Anesthesia Transfer of Care Note  Patient: Rebecca Rogers  Procedure(s) Performed: COLONOSCOPY  Patient Location: PACU  Anesthesia Type:General  Level of Consciousness: sedated  Airway & Oxygen Therapy: Patient Spontanous Breathing  Post-op Assessment: Report given to RN and Post -op Vital signs reviewed and stable  Post vital signs: Reviewed and stable  Last Vitals:  Vitals Value Taken Time  BP    Temp 35.7 C 11/21/23 07:54  Pulse 63 11/21/23 07:54  Resp 15 11/21/23 07:54  SpO2 98 % 11/21/23 07:54    Last Pain:  Vitals:   11/21/23 0754  TempSrc: Temporal  PainSc:          Complications: No notable events documented.

## 2023-11-21 NOTE — H&P (Signed)
 Rebecca Copping, MD [facility] [address] [place], [place] [address] [phone] Fax : [phone]  Primary Care Physician:  Alla Amis, MD Primary Gastroenterologist:  Dr. Copping  Pre-Procedure History & Physical: HPI:  Rebecca Rogers is a 68 y.o. female is here for an colonoscopy.   Past Medical History:  Diagnosis Date   Adenomyosis    Anxiety    Cyst of right breast    4-5 oclock 1/2 cm   Diabetes mellitus without complication (HCC)    HLD (hyperlipidemia)    Hypertension    bordeline   Hypothyroidism    Increased BMI     Past Surgical History:  Procedure Laterality Date   ABDOMINAL HYSTERECTOMY     lsh- adenomysis and fibroids   BREAST BIOPSY Right 2016   NEG   BREAST CYST ASPIRATION Right 12/24/2019   FNA cyst   COLONOSCOPY WITH PROPOFOL  N/A 06/05/2015   Procedure: COLONOSCOPY WITH PROPOFOL ;  Surgeon: Rebecca Copping, MD;  Location: [facility];  Service: Endoscopy;  Laterality: N/A;   COLONOSCOPY WITH PROPOFOL  N/A 12/04/2015   Procedure: COLONOSCOPY WITH PROPOFOL ;  Surgeon: Rebecca Copping, MD;  Location: [facility];  Service: Endoscopy;  Laterality: N/A;   COLONOSCOPY WITH PROPOFOL  N/A 12/21/2018   Procedure: COLONOSCOPY WITH PROPOFOL ;  Surgeon: Rogers Rogelia, MD;  Location: [facility] ENDOSCOPY;  Service: Endoscopy;  Laterality: N/A;   LAPAROSCOPIC SALPINGOOPHERECTOMY     prophylactic   POLYPECTOMY  06/05/2015   Procedure: POLYPECTOMY;  Surgeon: Rebecca Copping, MD;  Location: [facility];  Service: Endoscopy;;   POLYPECTOMY  12/04/2015   Procedure: POLYPECTOMY;  Surgeon: Rebecca Copping, MD;  Location: [facility];  Service: Endoscopy;;   TONSILLECTOMY  1965    Prior to Admission medications   Medication Sig Start Date End Date Taking? Authorizing Provider  alendronate (FOSAMAX) 70 MG tablet Take 70 mg by mouth once a week.    [provider]  atorvastatin  (LIPITOR) 10 MG tablet TAKE 1 TABLET(10 MG) BY MOUTH AT BEDTIME  09/22/22   [name], MD  azithromycin  (ZITHROMAX ) 250 MG tablet Take 1 tablet (250 mg total) by mouth daily. Take first 2 tablets together, then 1 every day until finished. 12/31/22   Corlis Burnard DEL, NP  benzonatate  (TESSALON ) 100 MG capsule Take 1 capsule (100 mg total) by mouth every 8 (eight) hours. 08/28/23   White, Shelba JONELLE, NP  guaiFENesin -codeine  100-10 MG/5ML syrup Take 5 mLs by mouth every 6 (six) hours as needed for cough. 08/28/23   Teresa Shelba JONELLE, NP  metFORMIN  (GLUCOPHAGE ) 1000 MG tablet Take 1 tablet (1,000 mg total) by mouth daily with breakfast. 04/19/23   [name], MD  promethazine -dextromethorphan (PROMETHAZINE -DM) 6.25-15 MG/5ML syrup Take 5 mLs by mouth 3 (three) times daily as needed for cough. 05/01/23   Loreda Myla JONELLE, NP  sertraline  (ZOLOFT ) 50 MG tablet TAKE 1 AND 1/2 TABLETS(75 MG) BY MOUTH DAILY 09/22/22   [name], MD  triamterene -hydrochlorothiazide  (DYAZIDE ) 37.5-25 MG capsule     [provider]  triamterene -hydrochlorothiazide  (MAXZIDE -25) 37.5-25 MG tablet TAKE 1 TABLET BY MOUTH DAILY 12/19/22 12/19/23  [name], MD    Allergies as of 09/25/2023   (No Known Allergies)    Family History  Problem Relation Age of Onset   Ovarian cancer Mother    Prostate cancer Father    Breast cancer Neg Hx    Colon cancer Neg Hx    Diabetes Neg Hx    Heart disease  Neg Hx     Social History   Socioeconomic History   Marital status: Married    Spouse name: [name]   Number of children: Not on file   Years of education: Not on file   Highest education level: Not on file  Occupational History   Not on file  Tobacco Use   Smoking status: Never   Smokeless tobacco: Never  Vaping Use   Vaping status: Never Used  Substance and Sexual Activity   Alcohol use: No   Drug use: No   Sexual activity: Not Currently  Other Topics Concern   Not on file  Social History Narrative   Not on file   Social Drivers of Health   Financial  Resource Strain: Low Risk  (03/10/2023)   Received from [facility]   Overall Financial Resource Strain (CARDIA)    Difficulty of Paying Living Expenses: Not hard at all  Food Insecurity: No Food Insecurity (03/10/2023)   Received from [facility]   Hunger Vital Sign    Within the past 12 months, you worried that your food would run out before you got the money to buy more.: Never true    Within the past 12 months, the food you bought just didn't last and you didn't have money to get more.: Never true  Transportation Needs: No Transportation Needs (03/10/2023)   Received from [facility] - Transportation    In the past 12 months, has lack of transportation kept you from medical appointments or from getting medications?: No    Lack of Transportation (Non-Medical): No  Physical Activity: Inactive (04/13/2017)   Exercise Vital Sign    Days of Exercise per Week: 0 days    Minutes of Exercise per Session: 0 min  Stress: Not on file  Social Connections: Not on file  Intimate Partner Violence: Not on file    Review of Systems: See HPI, otherwise negative ROS  Physical Exam: BP 138/74   Pulse 66   Temp (!) 96.9 F (36.1 C) (Tympanic)   Resp 20   Ht 5' 6 (1.676 m)   Wt 67.2 kg   SpO2 100%   BMI 23.92 kg/m  General:   Alert,  pleasant and cooperative in NAD Head:  Normocephalic and atraumatic. Neck:  Supple; no masses or thyromegaly. Lungs:  Clear throughout to auscultation.    Heart:  Regular rate and rhythm. Abdomen:  Soft, nontender and nondistended. Normal bowel sounds, without guarding, and without rebound.   Neurologic:  Alert and  oriented x4;  grossly normal neurologically.  Impression/Plan: [name] is here for an colonoscopy to be performed for a history of adenomatous polyps on 2020   Risks, benefits, limitations, and alternatives regarding  colonoscopy have been reviewed with the patient.  Questions have  been answered.  All parties agreeable.   Rebecca Copping, MD  11/21/2023, 7:29 AM

## 2023-11-21 NOTE — Op Note (Signed)
 [facility] Gastroenterology Patient Name: Rebecca Rogers Procedure Date: 11/21/2023 7:28 AM MRN: [id number] Account #: [id number] Date of Birth: Jun 01, 1955 Admit Type: Outpatient Age: 68 Room: [facility] ENDO ROOM 4 Gender: Female Note Status: Finalized Instrument Name: Colon Scope [id number] Procedure:             Colonoscopy Indications:           High risk colon cancer surveillance: Personal history                         of colonic polyps Providers:             Rogelia Copping MD, MD Referring MD:          Alda Carpen (Referring MD) Medicines:             Propofol  per Anesthesia Complications:         No immediate complications. Procedure:             Pre-Anesthesia Assessment:                        - Prior to the procedure, a History and Physical was                         performed, and patient medications and allergies were                         reviewed. The patient's tolerance of previous                         anesthesia was also reviewed. The risks and benefits                         of the procedure and the sedation options and risks                         were discussed with the patient. All questions were                         answered, and informed consent was obtained. Prior                         Anticoagulants: The patient has taken no anticoagulant                         or antiplatelet agents. ASA Grade Assessment: II - A                         patient with mild systemic disease. After reviewing                         the risks and benefits, the patient was deemed in                         satisfactory condition to undergo the procedure.                        After obtaining informed consent, the colonoscope was  passed under direct vision. Throughout the procedure,                         the patient's blood pressure, pulse, and oxygen                         saturations were monitored continuously. The                          Colonoscope was introduced through the anus and                         advanced to the the cecum, identified by appendiceal                         orifice and ileocecal valve. The colonoscopy was                         performed without difficulty. The patient tolerated                         the procedure well. The quality of the bowel                         preparation was excellent. Findings:      The perianal and digital rectal examinations were normal.      A few small-mouthed diverticula were found in the entire colon.      Non-bleeding internal hemorrhoids were found during retroflexion. The       hemorrhoids were Grade II (internal hemorrhoids that prolapse but reduce       spontaneously). Impression:            - Diverticulosis in the entire examined colon.                        - Non-bleeding internal hemorrhoids.                        - No specimens collected. Recommendation:        - Discharge patient to home.                        - Resume previous diet.                        - Continue present medications.                        - Repeat colonoscopy in 7 years for surveillance. Procedure Code(s):     --- Professional ---                        [phone], Colonoscopy, flexible; diagnostic, including                         collection of specimen(s) by brushing or washing, when                         performed (separate procedure) Diagnosis Code(s):     --- Professional ---  Z86.010, Personal history of colonic polyps CPT copyright 2022 American Medical Association. All rights reserved. The codes documented in this report are preliminary and upon coder review may  be revised to meet current compliance requirements. Rogelia Copping MD, MD 11/21/2023 7:53:36 AM This report has been signed electronically. Number of Addenda: 0 Note Initiated On: 11/21/2023 7:28 AM Scope Withdrawal Time: 0 hours 7 minutes 48 seconds  Total Procedure  Duration: 0 hours 16 minutes 43 seconds  Estimated Blood Loss:  Estimated blood loss: none.      [facility]

## 2023-11-21 NOTE — Anesthesia Postprocedure Evaluation (Signed)
 Anesthesia Post Note  Patient: Rebecca Rogers  Procedure(s) Performed: COLONOSCOPY  Patient location during evaluation: PACU Anesthesia Type: General Level of consciousness: awake and alert Pain management: pain level controlled Vital Signs Assessment: post-procedure vital signs reviewed and stable Respiratory status: spontaneous breathing, nonlabored ventilation, respiratory function stable and patient connected to nasal cannula oxygen Cardiovascular status: blood pressure returned to baseline and stable Postop Assessment: no apparent nausea or vomiting Anesthetic complications: no   No notable events documented.   Last Vitals:  Vitals:   11/21/23 0800 11/21/23 0804  BP:  124/61  Pulse: 61 66  Resp: 15 16  Temp:    SpO2: 98% 97%    Last Pain:  Vitals:   11/21/23 0754  TempSrc: Temporal  PainSc:                  Windell Farr

## 2023-12-09 ENCOUNTER — Other Ambulatory Visit: Payer: Self-pay | Admitting: Family Medicine

## 2024-01-12 ENCOUNTER — Ambulatory Visit
Admission: RE | Admit: 2024-01-12 | Discharge: 2024-01-12 | Disposition: A | Source: Ambulatory Visit | Attending: Obstetrics and Gynecology | Admitting: Obstetrics and Gynecology

## 2024-01-12 DIAGNOSIS — Z1231 Encounter for screening mammogram for malignant neoplasm of breast: Secondary | ICD-10-CM | POA: Insufficient documentation

## 2024-01-19 ENCOUNTER — Ambulatory Visit: Payer: Self-pay | Admitting: Obstetrics and Gynecology
